# Patient Record
Sex: Female | Born: 1950
Health system: Southern US, Community
[De-identification: ages and names within clinical notes are randomized; demographics above are authoritative.]

## PROBLEM LIST (undated history)

## (undated) DIAGNOSIS — R519 Headache, unspecified: Secondary | ICD-10-CM

## (undated) DIAGNOSIS — C50912 Malignant neoplasm of unspecified site of left female breast: Secondary | ICD-10-CM

## (undated) DIAGNOSIS — Z9889 Other specified postprocedural states: Secondary | ICD-10-CM

## (undated) DIAGNOSIS — R112 Nausea with vomiting, unspecified: Secondary | ICD-10-CM

## (undated) DIAGNOSIS — M1990 Unspecified osteoarthritis, unspecified site: Secondary | ICD-10-CM

## (undated) DIAGNOSIS — R011 Cardiac murmur, unspecified: Secondary | ICD-10-CM

## (undated) DIAGNOSIS — R51 Headache: Secondary | ICD-10-CM

## (undated) DIAGNOSIS — IMO0001 Reserved for inherently not codable concepts without codable children: Secondary | ICD-10-CM

## (undated) DIAGNOSIS — IMO0002 Reserved for concepts with insufficient information to code with codable children: Secondary | ICD-10-CM

## (undated) DIAGNOSIS — C50919 Malignant neoplasm of unspecified site of unspecified female breast: Secondary | ICD-10-CM

## (undated) DIAGNOSIS — C50911 Malignant neoplasm of unspecified site of right female breast: Secondary | ICD-10-CM

## (undated) HISTORY — DX: Reserved for concepts with insufficient information to code with codable children: IMO0002

## (undated) HISTORY — DX: Headache: R51

## (undated) HISTORY — DX: Headache, unspecified: R51.9

## (undated) HISTORY — DX: Cardiac murmur, unspecified: R01.1

## (undated) HISTORY — DX: Reserved for inherently not codable concepts without codable children: IMO0001

## (undated) HISTORY — DX: Unspecified osteoarthritis, unspecified site: M19.90

---

## 1997-07-27 HISTORY — PX: DILATION AND CURETTAGE OF UTERUS: SHX78

## 1999-01-20 ENCOUNTER — Other Ambulatory Visit: Admission: RE | Admit: 1999-01-20 | Discharge: 1999-01-20 | Payer: Self-pay | Admitting: *Deleted

## 2000-10-18 ENCOUNTER — Other Ambulatory Visit: Admission: RE | Admit: 2000-10-18 | Discharge: 2000-10-18 | Payer: Self-pay | Admitting: *Deleted

## 2002-07-27 HISTORY — PX: ABDOMINAL HYSTERECTOMY: SHX81

## 2003-02-06 ENCOUNTER — Other Ambulatory Visit: Admission: RE | Admit: 2003-02-06 | Discharge: 2003-02-06 | Payer: Self-pay | Admitting: *Deleted

## 2003-05-29 ENCOUNTER — Observation Stay (HOSPITAL_COMMUNITY): Admission: RE | Admit: 2003-05-29 | Discharge: 2003-05-30 | Payer: Self-pay | Admitting: *Deleted

## 2003-05-29 ENCOUNTER — Encounter (INDEPENDENT_AMBULATORY_CARE_PROVIDER_SITE_OTHER): Payer: Self-pay

## 2004-05-14 ENCOUNTER — Other Ambulatory Visit: Admission: RE | Admit: 2004-05-14 | Discharge: 2004-05-14 | Payer: Self-pay | Admitting: *Deleted

## 2005-07-06 ENCOUNTER — Other Ambulatory Visit: Admission: RE | Admit: 2005-07-06 | Discharge: 2005-07-06 | Payer: Self-pay | Admitting: *Deleted

## 2005-09-16 ENCOUNTER — Encounter: Admission: RE | Admit: 2005-09-16 | Discharge: 2005-09-16 | Payer: Self-pay | Admitting: Rheumatology

## 2007-09-23 ENCOUNTER — Other Ambulatory Visit: Admission: RE | Admit: 2007-09-23 | Discharge: 2007-09-23 | Payer: Self-pay | Admitting: Family Medicine

## 2008-07-27 HISTORY — PX: BREAST ENHANCEMENT SURGERY: SHX7

## 2008-07-27 HISTORY — PX: BREAST LUMPECTOMY: SHX2

## 2008-10-02 ENCOUNTER — Encounter: Admission: RE | Admit: 2008-10-02 | Discharge: 2008-10-02 | Payer: Self-pay | Admitting: Surgery

## 2008-11-06 ENCOUNTER — Encounter (INDEPENDENT_AMBULATORY_CARE_PROVIDER_SITE_OTHER): Payer: Self-pay | Admitting: Surgery

## 2008-11-06 ENCOUNTER — Ambulatory Visit (HOSPITAL_COMMUNITY): Admission: RE | Admit: 2008-11-06 | Discharge: 2008-11-06 | Payer: Self-pay | Admitting: Surgery

## 2008-11-06 DIAGNOSIS — C50912 Malignant neoplasm of unspecified site of left female breast: Secondary | ICD-10-CM

## 2008-11-06 HISTORY — DX: Malignant neoplasm of unspecified site of left female breast: C50.912

## 2008-11-08 ENCOUNTER — Ambulatory Visit: Payer: Self-pay | Admitting: Oncology

## 2008-11-13 ENCOUNTER — Ambulatory Visit: Admission: RE | Admit: 2008-11-13 | Discharge: 2009-02-06 | Payer: Self-pay | Admitting: Radiation Oncology

## 2008-12-18 ENCOUNTER — Encounter (INDEPENDENT_AMBULATORY_CARE_PROVIDER_SITE_OTHER): Payer: Self-pay | Admitting: Surgery

## 2008-12-18 ENCOUNTER — Ambulatory Visit (HOSPITAL_COMMUNITY): Admission: RE | Admit: 2008-12-18 | Discharge: 2008-12-18 | Payer: Self-pay | Admitting: Surgery

## 2009-01-31 ENCOUNTER — Ambulatory Visit: Payer: Self-pay | Admitting: Oncology

## 2009-02-06 ENCOUNTER — Ambulatory Visit: Admission: RE | Admit: 2009-02-06 | Discharge: 2009-03-26 | Payer: Self-pay | Admitting: Radiation Oncology

## 2009-03-21 ENCOUNTER — Ambulatory Visit: Payer: Self-pay | Admitting: Oncology

## 2009-03-25 LAB — CBC WITH DIFFERENTIAL/PLATELET
BASO%: 0.4 % (ref 0.0–2.0)
Basophils Absolute: 0 10*3/uL (ref 0.0–0.1)
EOS%: 1.4 % (ref 0.0–7.0)
Eosinophils Absolute: 0.1 10*3/uL (ref 0.0–0.5)
HCT: 41 % (ref 34.8–46.6)
HGB: 13.8 g/dL (ref 11.6–15.9)
LYMPH%: 17.1 % (ref 14.0–49.7)
MCH: 28.7 pg (ref 25.1–34.0)
MCHC: 33.8 g/dL (ref 31.5–36.0)
MCV: 85 fL (ref 79.5–101.0)
MONO#: 0.4 10*3/uL (ref 0.1–0.9)
MONO%: 8.4 % (ref 0.0–14.0)
NEUT#: 3.5 10*3/uL (ref 1.5–6.5)
NEUT%: 72.7 % (ref 38.4–76.8)
Platelets: 221 10*3/uL (ref 145–400)
RBC: 4.82 10*6/uL (ref 3.70–5.45)
RDW: 13.9 % (ref 11.2–14.5)
WBC: 4.9 10*3/uL (ref 3.9–10.3)
lymph#: 0.8 10*3/uL — ABNORMAL LOW (ref 0.9–3.3)

## 2009-03-25 LAB — COMPREHENSIVE METABOLIC PANEL
ALT: 14 U/L (ref 0–35)
AST: 15 U/L (ref 0–37)
Albumin: 4.6 g/dL (ref 3.5–5.2)
Alkaline Phosphatase: 90 U/L (ref 39–117)
BUN: 11 mg/dL (ref 6–23)
CO2: 21 mEq/L (ref 19–32)
Calcium: 9.7 mg/dL (ref 8.4–10.5)
Chloride: 103 mEq/L (ref 96–112)
Glucose, Bld: 96 mg/dL (ref 70–99)
Potassium: 4.1 mEq/L (ref 3.5–5.3)
Sodium: 139 mEq/L (ref 135–145)
Total Bilirubin: 0.4 mg/dL (ref 0.3–1.2)
Total Protein: 7.4 g/dL (ref 6.0–8.3)

## 2009-06-24 ENCOUNTER — Ambulatory Visit: Payer: Self-pay | Admitting: Oncology

## 2009-08-26 ENCOUNTER — Other Ambulatory Visit: Admission: RE | Admit: 2009-08-26 | Discharge: 2009-08-26 | Payer: Self-pay | Admitting: Family Medicine

## 2009-08-27 ENCOUNTER — Ambulatory Visit: Payer: Self-pay | Admitting: Obstetrics & Gynecology

## 2009-09-20 ENCOUNTER — Ambulatory Visit: Payer: Self-pay | Admitting: Obstetrics & Gynecology

## 2009-09-20 ENCOUNTER — Ambulatory Visit (HOSPITAL_COMMUNITY): Admission: RE | Admit: 2009-09-20 | Discharge: 2009-09-21 | Payer: Self-pay | Admitting: Obstetrics & Gynecology

## 2009-10-02 ENCOUNTER — Ambulatory Visit: Payer: Self-pay | Admitting: Obstetrics & Gynecology

## 2009-10-22 ENCOUNTER — Ambulatory Visit: Payer: Self-pay | Admitting: Obstetrics & Gynecology

## 2010-10-17 LAB — BASIC METABOLIC PANEL
BUN: 12 mg/dL (ref 6–23)
CO2: 29 mEq/L (ref 19–32)
Calcium: 9.4 mg/dL (ref 8.4–10.5)
Creatinine, Ser: 0.56 mg/dL (ref 0.4–1.2)
Potassium: 4.1 mEq/L (ref 3.5–5.1)

## 2010-10-17 LAB — CBC
Hemoglobin: 10.8 g/dL — ABNORMAL LOW (ref 12.0–15.0)
MCHC: 34.2 g/dL (ref 30.0–36.0)
MCV: 88.4 fL (ref 78.0–100.0)
MCV: 88.5 fL (ref 78.0–100.0)
Platelets: 216 10*3/uL (ref 150–400)
RBC: 3.58 MIL/uL — ABNORMAL LOW (ref 3.87–5.11)
RBC: 4.63 MIL/uL (ref 3.87–5.11)
RDW: 13.4 % (ref 11.5–15.5)
RDW: 13.7 % (ref 11.5–15.5)
WBC: 4.2 10*3/uL (ref 4.0–10.5)
WBC: 8.4 10*3/uL (ref 4.0–10.5)

## 2010-10-22 ENCOUNTER — Other Ambulatory Visit: Payer: Self-pay | Admitting: Obstetrics & Gynecology

## 2010-10-22 ENCOUNTER — Ambulatory Visit: Payer: 59 | Admitting: Obstetrics & Gynecology

## 2010-10-22 DIAGNOSIS — Z1272 Encounter for screening for malignant neoplasm of vagina: Secondary | ICD-10-CM

## 2010-10-22 DIAGNOSIS — Z01419 Encounter for gynecological examination (general) (routine) without abnormal findings: Secondary | ICD-10-CM

## 2010-10-28 NOTE — Assessment & Plan Note (Signed)
NAMECYLEIGH, Natalie Valdez              ACCOUNT NO.:  [id number]  MEDICAL RECORD NO.:  [id number]           PATIENT TYPE:  LOCATION:  [facility] at [place]           FACILITY:  PHYSICIAN:  Allie Bossier, MD             DATE OF BIRTH:  DATE OF SERVICE:  10/22/2010                                 CLINIC NOTE  Ms. Cheeks is a 60 year old married white G4, P4 who is here for annual exam.  Her main complaint today is that of anxiety due to job stress. She would like some medication for this.  However, I am going to have to wait to give her medicine until she can give me her full medicine list. She tells me that recently she was diagnosed with a tremor and was placed on anti-tremor medicine, and she will have to call me and let me know what that medicine is.  She also tells me she is taking a anti- swelling medicine, and she will call me and let me know what that medicine is.  MEDICATIONS:  As above plus Tylenol, fish oil, vitamin C, vitamin D, vitamin E, glucosamine, zinc, multivitamin, Ambien at night, and Vagifem 2 times a week on a p.r.n. basis.  No latex allergies.  No drug allergies.  SOCIAL HISTORY:  She reports social alcohol.  Denies tobacco or illegal drug use.  FAMILY HISTORY:  Positive for breast cancer in her mother who is postmenopausal at the time of diagnosis and uterine cancer in a paternal grandmother.  She denies family history of colon or pancreas cancer.  PAST SURGICAL HISTORY:  Left lumpectomy x2, supracervical hysterectomy and BSO in 2005, C-section, D and C, laparoscopy, reconstructive breast surgery, midurethral sling anterior and posterior repair, and those were all done last year February 2011.  MEDICAL HISTORY:  History of left breast cancer, stage I.  PHYSICAL EXAMINATION:  GENERAL:  Well-nourished, well-hydrated white female. VITAL SIGNS:  Height 5 feet 4-1/2 inches, weight 151 pounds, blood pressure 125/80, pulse 84. HEENT:  Normal. HEART:  Regular rate  and rhythm. LUNGS:  Clear to auscultation bilaterally. BREASTS:  Normal bilaterally. ABDOMEN:  Benign.  No palpable hepatosplenomegaly. EXTERNAL GENITALIA:  Moderate atrophy.  No lesions.  Good support.  Her cervix has first-degree prolapse, but appears normal.  Bimanual exam reveals no pelvic masses and no tenderness.  ASSESSMENT AND PLAN: 1. Annual exam.  I am going to check a Pap smear since she still has a     cervix.  I am recommending self-breast and self-vulvar exams,     counseled for the mammograms and has 20% of breast cancer. 2. Vaginal atrophy.  I am giving her 10 mcg of Vagifem to be used     twice a week or less often as she prefers. 3. Anxiety.  I will have to wait till she calls me and tells me what     other prescription medications she is on.  At that time, I will be     able to prescribe her some medicines.  REVIEW OF SYSTEMS:  She has been married for 14 years.  She denies dyspareunia.  She has maintained her 50-pound weight loss with exercise and  diet changes, and her work is very stressful.     Allie Bossier, MD    MCD/MEDQ  D:  10/22/2010  T:  10/23/2010  Job:  [id number]

## 2010-11-04 LAB — BASIC METABOLIC PANEL
CO2: 27 mEq/L (ref 19–32)
Calcium: 9.4 mg/dL (ref 8.4–10.5)
Potassium: 4.1 mEq/L (ref 3.5–5.1)
Sodium: 141 mEq/L (ref 135–145)

## 2010-11-04 LAB — CBC
HCT: 38.8 % (ref 36.0–46.0)
MCV: 83.9 fL (ref 78.0–100.0)
Platelets: 241 10*3/uL (ref 150–400)
RBC: 4.63 MIL/uL (ref 3.87–5.11)
RDW: 14 % (ref 11.5–15.5)

## 2010-11-05 LAB — CBC
HCT: 40 % (ref 36.0–46.0)
Hemoglobin: 13.7 g/dL (ref 12.0–15.0)
MCHC: 34.2 g/dL (ref 30.0–36.0)
MCV: 84.1 fL (ref 78.0–100.0)
Platelets: 303 10*3/uL (ref 150–400)

## 2010-11-05 LAB — DIFFERENTIAL
Lymphocytes Relative: 30 % (ref 12–46)
Lymphs Abs: 2.1 10*3/uL (ref 0.7–4.0)
Monocytes Absolute: 0.5 10*3/uL (ref 0.1–1.0)
Monocytes Relative: 8 % (ref 3–12)
Neutro Abs: 4.3 10*3/uL (ref 1.7–7.7)

## 2010-11-05 LAB — BASIC METABOLIC PANEL
CO2: 28 mEq/L (ref 19–32)
GFR calc Af Amer: >60 mL/min (ref >60)
GFR calc non Af Amer: >60 mL/min (ref >60)

## 2010-12-09 NOTE — Op Note (Signed)
NAMESUTTON, PLAKE              ACCOUNT NO.:  [id number]   MEDICAL RECORD NO.:  [id number]          PATIENT TYPE:  AMB   LOCATION:  SDS                          FACILITY:  [facility]   PHYSICIAN:  Thomas A. Cornett, M.D.DATE OF BIRTH:  1951-04-06   DATE OF PROCEDURE:  11/06/2008  DATE OF DISCHARGE:  11/06/2008                               OPERATIVE REPORT   PREOPERATIVE DIAGNOSIS:  Left breast cancer.   POSTOPERATIVE DIAGNOSIS:  Left breast cancer.   PROCEDURES:  1. Left breast needle-localized lumpectomy.  2. Injection of methylene blue dye into left breast for sentinel lymph      node mapping.  3. Left axillary lymph node dissection.   SURGEON:  Maisie Fus A. Cornett, MD   ANESTHESIA:  LMA with 0.25% Sensorcaine local.   ESTIMATED BLOOD LOSS:  50 mL.   DRAIN:  19 round Blake drain to left axilla.   SPECIMEN:  1. Left breast needle-localized tissue verified by radiography to be      adequate with an additional anterior margin taken with wire and      clip present.  2. Left axillary contents.   INDICATIONS FOR PROCEDURE:  The patient is a 60 year old female found to  have a left breast cancer.  This was felt to be stage I clinically.  She  elected for breast conserving measures, been presents today for left  breast needle-localized lumpectomy and sentinel lymph node mapping.   DESCRIPTION OF PROCEDURE:  The patient was brought to the operating room  and placed supine.  She underwent preoperative injection of technetium  sulfur colloid in the holding area and left breast wire localization by  the radiologist.  After being brought to the operating room, general  anesthesia was initiated and left breast was prepped with chlorhexidine  and 4 mL of methylene blue dye were injected in the medial aspect at the  nipple skin interface in a subcuticular region.  This was massaged for 5  minutes.  Next, a left breast was then prepped and draped in a sterile  fashion to include the left  axilla.  NeoProbe was then used, but no  radioactive activity could be identified in the left axilla.  There was  activity in the breast.  This point, I made an incision to the left  axilla with the hope of finding the nodes via the blue dye which was  methylene blue.  Incision was made and dissection was carried down into  the left axilla.  I examined the left axillary contents again could find  no blue nodes nor hot nodes.  I went ahead and packed this off because  the left axillary node dissection would have to be done.   I then switched gears and performed the left breast needle-localized  lumpectomy.  The wire entered into the breast medially.  A horizontal  incision was made from the wire towards the nipple.  All tissue around  the wire was excised.  The anterior margin I felt was closed with the  specimen, so I took additional anterior margin with it.  The radiographs  were done by  the radiologist who said the anterior margin was closed.  I  let him know I took additional tissue since the calcifications were  indeed with the wire, but anterior margin was closed.  This additional  tissue was sent separately.  I irrigated out the cavity and placed dry  sponges there.   Next, I turned attention to left axilla again.  I then dissected out the  left axillary contents.  Identified the long thoracic nerve medially and  the thoracodorsal trunk posteriorly.  The axillary vein was identified  as well.  Small vessels entering the axillary contents were taken  between clips and the axillary vein was preserved.  All lymphovascular  tissue between the axillary vein, the thoracodorsal trunk and the long  thoracic nerve was identified and excised.  The structures were easily  visible afterwards.  I saw no injury to the axillary vein upon close  inspection nor any injury to the thoracodorsal trunk or long thoracic  nerves.  The intercostal nerves were sacrifice.  Irrigation was used and  the area  was hemostatic.  Surgicel was placed in the base.  There was  separate stab wound, 19 Blake drain was placed and secured the skin with  3-0 nylon.  A 3-0 Vicryl was then used to close the deep layer of tissue  and 4-0 Monocryl was used to close skin in subcuticular fashion.   A lumpectomy site was inspected and found to be hemostatic.  Sponges  were removed and counted, and found to be correct.  This wound was  closed in layers with the deep layer of 3-0 Vicryl and subsequent 3-0  Monocryl layer.  Dermabond was applied to both incisions.  All final  counts of sponge, needle, and instruments were found to be correct at  this portion of the case.  The patient was awoke, taken to recovery in  satisfactory condition.      Thomas A. Cornett, M.D.  Electronically Signed     TAC/MEDQ  D:  11/06/2008  T:  11/07/2008  Job:  [id number]   cc:   [facility] L. Yolanda Bonine, M.D.

## 2010-12-09 NOTE — Assessment & Plan Note (Signed)
Natalie Valdez, Natalie Valdez              ACCOUNT NO.:  [id number]   MEDICAL RECORD NO.:  [id number]          PATIENT TYPE:  POB   LOCATION:  [facility] [place]         FACILITY:  [facility]   PHYSICIAN:  Allie Bossier, MD        DATE OF BIRTH:  Jul 06, 1951   DATE OF SERVICE:                                  CLINIC NOTE   Natalie Valdez is a 60 year old married white G4, P4 who comes in with a 18-month  history of feeling something in my vagina.  She says that there is  something bulges especially when she has to urinate and decreases in  size when she voids.  She complains of a 34-year history of stress  incontinence.  She states she does not actually have to wear pads, but  she will wet her pants on any occasion when she laughs hard or jumps.  She recently saw her family doctor who thought she had some degree of  prolapse of her cervix.  Please note that she had a supracervical  hysterectomy and a BSO in approximately the year 2005.   PAST SURGICAL HISTORY:  As above plus left lumpectomy x2, a D and C, and  a laparoscopy.   PAST MEDICAL HISTORY:  History of left breast CA, stage I; genuine  stress urinary incontinence for 34 years.   REVIEW OF SYSTEMS:  She had radiation after her lumpectomies in  September 2010.  She has been married for 13 years.  She does have  occasional vaginal dryness and does not have dyspareunia.  She has  intercourse for about 2 times a month.  She does have a decreased libido  which she attributes to her age.  She has had a recent 50-pound weight  loss, this was intentional.  Her Pap smear was done today and her  mammogram was done last September 2010.  She is aware that she needs to  schedule a colonoscopy in the near future.   FAMILY HISTORY:  Positive for breast cancer in her mother who was  diagnosed in a postmenopausal age and uterine cancer in a paternal  grandmother.  She denies family history of colon cancer.   MEDICATIONS:  1. Tylenol as necessary.  2. Fish oil  1000 mg daily.  3. Vitamin C 500 mg daily.  4. Vitamin D 400 units daily.  5. Vitamin E 1000 units daily.  6. Glucosamine.  7. Zinc.  8. Multivitamins daily.  9. She was recently given samples of Lunesta because she recently had      difficulty sleeping.   PHYSICAL EXAMINATION:  GENERAL:  Well nourished, well hydrated very  pleasant white female in no apparent distress.  HEENT:  Normal.  HEART:  Regular rate and rhythm.  LUNGS:  Clear to auscultation bilaterally.  PELVIC:  External genitalia, minimal amount of atrophy.  She has a third-  degree cystocele and second-degree prolapse of her cervix.  There is no  rectocele.  There is no adnexal masses on exam.   ASSESSMENT AND PLAN:  Symptomatic cystocele and genuine stress urinary  incontinence.  I will schedule an anterior colporrhaphy and a  pubovaginal sling [employer]).  It would be an  outpatient  procedure and I have told her that she will go home with catheter, have  a voiding trial couple days after surgery.  I told her the risks  associated with surgery including, but not limited to infection,  bleeding, damage to bowel, bladder or ureters.      Allie Bossier, MD     MCD/MEDQ  D:  08/27/2009  T:  08/28/2009  Job:  [id number]

## 2010-12-09 NOTE — Op Note (Signed)
Natalie Valdez, Natalie Valdez              ACCOUNT NO.:  [id number]   MEDICAL RECORD NO.:  [id number]          PATIENT TYPE:  AMB   LOCATION:  SDS                          FACILITY:  [facility]   PHYSICIAN:  Thomas A. Cornett, M.D.DATE OF BIRTH:  July 16, 1951   DATE OF PROCEDURE:  12/18/2008  DATE OF DISCHARGE:  12/18/2008                               OPERATIVE REPORT   PREOPERATIVE DIAGNOSIS:  Left breast ductal carcinoma in situ, high-  grade with positive margins after lumpectomy.   POSTOPERATIVE DIAGNOSIS:  Left breast ductal carcinoma in situ, high-  grade with positive margins after lumpectomy.   PROCEDURE:  Re-excision, left breast lumpectomy.   SURGEON:  Maisie Fus A. Cornett, MD   ANESTHESIA:  LMA with 0.25% Sensorcaine local.   ESTIMATED BLOOD LOSS:  10 mL.   SPECIMEN:  Left breast lumpectomy cavity, oriented and sent to pathology  for evaluation.   INDICATIONS FOR PROCEDURE:  The patient is a 60 year old female who has  high-grade left breast DCIS after lumpectomy.  She requires re-excision  for breast conserving means and presents today for that.   DESCRIPTION OF PROCEDURE:  The patient was brought to the operating room  and placed supine.  After induction of general anesthesia, left breast  was then prepped and draped in sterile fashion.  The medial breast  incision was reopened and the cavity was identified.  The entire cavity  was reexcised.  This was oriented and sent to pathology.  Irrigation was  used and suctioned out.  I then mobilized some breast tissue to help  close the defect from superior and inferior portions of the breast.  However, I then closed with a 3-0 Vicryl.  I then approximated the  subcutaneous tissues with 3-0 Vicryl and subsequent 4-0 Monocryl was  used to close the skin in subcuticular fashion.  Dermabond was applied.  All final counts of sponge, needle, and instrument were found to be  correct at this portion of the case.  The patient was awoke and  taken to  recovery in satisfactory condition.      Thomas A. Cornett, M.D.  Electronically Signed     TAC/MEDQ  D:  12/18/2008  T:  12/19/2008  Job:  [id number]   cc:   Maryln Gottron, M.D.  Jethro Bolus, MD

## 2010-12-09 NOTE — Assessment & Plan Note (Signed)
[name], Natalie Valdez              ACCOUNT NO.:  [id number]   MEDICAL RECORD NO.:  [id number]          PATIENT TYPE:  POB   LOCATION:  [facility] [place]         FACILITY:  [facility]   PHYSICIAN:  Allie Bossier, MD        DATE OF BIRTH:  May 07, 1951   DATE OF SERVICE:  10/22/2009                                  CLINIC NOTE   [name] is a 60 year old married white lady who is now postop x4-1/2  week after a anterior repair, perineoplasty, midurethral sling [facility]  [employer]), cystoscopy, and breast bilateral mastopexy with the general  surgeon.  Postoperatively, she is doing well.  She has no difficulties  voiding and also she denies stress incontinence.  She has not had  intercourse yet.  She has no postop problems.   On exam, she does have significant atrophy of her vagina, but her  incisions are well-healed.  Her cosmetic effect is pleasing and her  bimanual exam is normal.  I have recommended that she use Vagifem 10 mcg  twice a week in her vagina and that she wait at least a week before  having intercourse again.  I have told her that at this level (10 mcg  twice a week they will not be significant systemic absorption and should  not affect her history of breast cancer.)  She understands, wishes to  try, written a prescription.  She will come back in a year or sooner as  necessary.      Allie Bossier, MD     MCD/MEDQ  D:  10/22/2009  T:  10/23/2009  Job:  [id number]

## 2010-12-12 NOTE — Op Note (Signed)
Natalie Valdez, Natalie Valdez                   ACCOUNT NO.:  [id number]   MEDICAL RECORD NO.:  [id number]                   PATIENT TYPE:  OBV   LOCATION:  0470                                 FACILITY:  [facility]   PHYSICIAN:  Almedia Balls. Fore, M.D.                DATE OF BIRTH:  1950/11/05   DATE OF PROCEDURE:  05/29/2003  DATE OF DISCHARGE:                                 OPERATIVE REPORT   PREOPERATIVE DIAGNOSES:  1. Pelvic pain.  2. Persisting adnexal cyst.  3. Abnormal uterine bleeding.   POSTOPERATIVE DIAGNOSES:  1. Pelvic pain.  2. Persisting adnexal cyst.  3. Abnormal uterine bleeding.  4. Extensive pelvic adhesions with ureteral adhesions.   OPERATION:  1. Abdominal supracervical hypertension, bilateral salpingo-oophorectomy.  2. Right ureterolysis.   ANESTHESIA:  General orotracheal.   OPERATOR:  Almedia Balls. Randell Patient, M.D.   FIRST ASSISTANT:  Leona Singleton, M.D.   INDICATIONS:  The patient is a 60 year old with the above-noted preoperative  diagnoses, who was counseled as to the need for surgery to treat these  problems.  She was fully counseled as to the nature of the procedure and the  risks involved, to include risks of anesthesia, injury to bowel, bladder,  blood vessels, ureters, postoperative hemorrhage, infection, recuperation,  and possible use of hormone replacement should her ovaries be removed.  She  fully understands all these considerations and wishes to proceed on May 29, 2003.   OPERATIVE FINDINGS:  On entry into the abdomen, exploration of the upper  abdominal viscera revealed the liver, lower liver edge, area of the  gallbladder, spleen, kidneys, periaortic areas, and appendix to be normal to  visualization and/or palpation.  In the pelvis, the uterus was enlarged to  approximately 8-10 weeks' gestational size and quite soft.  The left ovary  was involved with adhesions to the descending rectosigmoid.  There was what  appeared to be a simple cyst  on the right ovary as well.  In the right  adnexal area the entire adnexa was quite adherent to the posterolateral  peritoneal surface with some retraction of the peritoneum and alteration in  the course of the right ureter.  The right tube was involved with extensive  hydrosalpinx formation.  The right ovary was quite adherent to the  posterolateral peritoneum and lateral posterior uterine surfaces.   DESCRIPTION OF PROCEDURE:  With the patient under general anesthesia,  prepared and draped in the usual sterile fashion, with a Foley catheter in  the bladder, a lower abdominal transverse incision was made after excising a  previous surgical incision.  The incision was carried into the peritoneal  cavity without difficulty.  A self-retaining retractor was placed after  lysis of an anterior omental adhesion to the peritoneum.  Bowel was packed  off as well.  Kelly clamps were used to clamp the uterine-ovarian  anastomoses, tubes, and round ligaments bilaterally for traction and  hemostasis.  The  left round ligament was transected using Bovie  electrocoagulation with development of a bladder flap anteriorly and entry  into the retroperitoneal space.  Adhesions were lysed in the area of the  left adnexa to the descending rectosigmoid.  The infundibulopelvic ligament  on the left was identified, clamped, cut, and doubly ligated with 1 chromic  catgut.  The left tube and ovary were then freed of their adhesions and  peritoneal attachments using Bovie electrocoagulation.  Heaney clamp was  placed on the uterine vessels on the left; these structures were then cut  and suture ligated with 1 chromic catgut.  A straight Heaney clamp was  placed on the cardinal ligament on the left, which was then cut and suture  ligated with 1 chromic catgut.  The right round ligament was likewise  transected using Bovie electrocoagulation with development of the bladder  flap anteriorly.  Great care was then taken  to lyse the adhesions involving  the adnexa on the right.  The adhesions to the ureter were lysed using sharp  dissection with great care taken to avoid injury to the ureter or vessels in  this area.  This effectively allowed the ureter to be replaced in its  original course.  The right adnexal structures were then able to be elevated  out of the posterior cul-de-sac and posterolateral peritoneal area.  The  right infundibulopelvic ligament was then identified well above the course  of the ureter, clamped, cut, and doubly ligated with 1 chromic catgut.  Further dissection was necessary to free the adnexal structures on the  right, again with great care to avoid injury to the ureter.  The right  uterine vessels were then skeletonized, clamped, cut, and suture ligated  with 1 chromic catgut.  The right cardinal ligament was likewise clamped  using a Heaney clamp, cut, and suture ligated with 1 chromic catgut.  The  bladder flap had been advanced well down on the cervix, and it was possible  to amputate the uterine fundus from the cervix using Bovie  electrocoagulation.  The endocervix was cored out a reverse cone method.  The exocervix was likewise treated using Bovie electrocoagulation to prevent  hemorrhage and to destroy endocervical glands to prevent extensive  leukorrhea.  The cervical stump was reapproximated and rendered hemostatic  with interrupted figure-of-eight sutures of 1 chromic catgut.  The area was  lavaged with copious amounts of lactated Ringer's solution and after noting  that hemostasis was maintained throughout the surgical area and that sponge  and instrument counts were correct, the peritoneum was closed with a  continuous suture of 0 Vicryl.  The fascia was closed with two sutures of 0  Vicryl which were brought from the lateral aspects of the incision and tied  separately in the midline.  Subcutaneous fat was reapproximated with interrupted horizontal mattress sutures  of 0 Vicryl.  Skin was closed with a  subcuticular suture of 3-0 plain catgut.  Estimated blood loss 250 mL.  The  patient was taken to the recovery room in good condition with clear urine in  the Foley catheter tubing.  She will be placed on 23-hour observation  following surgery.                                               Almedia Balls. Randell Patient, M.D.    SRF/MEDQ  D:  05/29/2003  T:  05/30/2003  Job:  [id number]   cc:   Leona Singleton, M.D.  [address]  [place]  [place] [address]  Fax: [phone]

## 2010-12-12 NOTE — H&P (Signed)
NAMEMIRINDA, MONTE                   ACCOUNT NO.:  [id number]   MEDICAL RECORD NO.:  [id number]                   PATIENT TYPE:  AMB   LOCATION:  DAY                                  FACILITY:  [facility]   PHYSICIAN:  Almedia Balls. Fore, M.D.                DATE OF BIRTH:  1950-10-11   DATE OF ADMISSION:  05/29/2003  DATE OF DISCHARGE:                                HISTORY & PHYSICAL   CHIEF COMPLAINT:  Pain, abnormal bleeding, adnexal cysts and adhesions.   HISTORY OF PRESENT ILLNESS:  The patient is a 60 year old whose last  menstrual period was March 09, 2003.  Because of abnormal bleeding and pain  she underwent a laparoscopy on April 27, 2003 at which time she was found  to have adnexal cysts which were quite diffuse particularly on the right  side with some adhesions involving the uterus and tubes and ovaries  throughout the pelvis.  The uterus was enlarged as well with the suggestion  of endometriosis.  A D&C was performed at that time as well which showed  disordered endometrium with foci of simple hyperplasia without atypia.  She  is admitted at this time for TAH/BSO.  She has been fully counseled as to  the nature of the procedure and the risks involved to include the risks of  anesthesia, injury to bowel, bladder, blood vessels, ureters, postoperative  hemorrhage, infection, recuperation and menopausal symptoms following  surgery.  Because of previous deep venous phlebitis, hormone therapy was  contraindicated for this patient.   PAST MEDICAL HISTORY:  1. Cesarean section in the past.  2. Laparoscopy and D&C as noted above.   ALLERGIES:  She is allergic to no medications.   MEDICATIONS:  1. Simple analgesics recently.   FAMILY HISTORY:  Mother with cancer of unknown type.  Grandmother with  carcinoma of the ovary.   REVIEW OF SYMPTOMS:  HEENT:  She wears glasses.  CARDIORESPIRATORY:  Negative.  GASTROINTESTINAL:  Negative.  GENITOURINARY:  As noted above  plus  some urinary frequency with occasional urge and stress incontinence, but not  severe.  NEUROMUSCULAR:  Negative.   PHYSICAL EXAMINATION:  VITAL SIGNS:  Height 5'5 1/2, weight 181 pounds,  blood pressure 120/70, pulse 80, respirations 18.  GENERAL:  Well developed white female in no acute distress.  HEENT:  Within normal limits.  The neck is supple without masses, adenopathy  or bruit.  HEART:  Regular rate and rhythm with a grade 2 murmur at the left sternal  border which had been noted by previous M.D. and checked out without a  definite diagnosis; felt to be an innocent murmur.  BREAST EXAM:  Sitting and lying are without mass.  Axilla is negative.  ABDOMEN:  Soft with no palpable mass and non-tender.  PELVIC EXAM:  External genitalia, Bartholin's, urethral and Skein's glands  are within normal limits.  Vagina is clean. The cervix is slightly inflamed.  The uterus  is mid posterior and approximately 10 to [redacted] weeks gestational  size.  Adnexa are without palpable masses, but tender bilaterally.  Rectovaginal exam is confirmatory.  EXTREMITIES:  Within normal limits.  CENTRAL NERVOUS SYSTEM:  Grossly intact.  SKIN:  Without suspicious lesions.   IMPRESSION:  1. Abnormal bleeding.  2. Uterine enlargement.  3. Pelvic pain.  4. Persisting adnexal cysts.   DISPOSITION:  As noted above.  Of note is that a Pap smear in July of 2004  was within normal limits.                                               Almedia Balls. Randell Patient, M.D.    SRF/MEDQ  D:  05/23/2003  T:  05/23/2003  Job:  [id number]

## 2010-12-12 NOTE — Discharge Summary (Signed)
   Natalie Valdez, Natalie Valdez                   ACCOUNT NO.:  [id number]   MEDICAL RECORD NO.:  [id number]                   PATIENT TYPE:  OBV   LOCATION:  0470                                 FACILITY:  [facility]   PHYSICIAN:  Almedia Balls. Fore, M.D.                DATE OF BIRTH:  08/04/1950   DATE OF ADMISSION:  05/29/2003  DATE OF DISCHARGE:  05/30/2003                                 DISCHARGE SUMMARY   HISTORY:  The patient is a 60 year old with pelvic pain, right adnexal  cystic mass, pelvic adhesions for hysterectomy, possible bilateral salpingo-  oophorectomy on May 29, 2003.  The remainder of her history and physical  are as previously dictated.   LABORATORY DATA:  Included a preoperative hemoglobin of 12.5.   Electrocardiogram was indicative of sinus bradycardia but otherwise normal.   HOSPITAL COURSE:  The patient was taken to the operating room, on May 29, 2003, at which time an abdominal supracervical hysterectomy, bilateral  salpingo-oophorectomy, ureteral lysis were performed.  The patient did well  postoperatively.  Diet and ambulation were progressed over the evening of  May 29, 2003 and early morning May 30, 2003.  On the morning of  May 30, 2003, she was afebrile and experiencing no problems, except for  pain which was controlled by oral analgesics.  It was felt that she could be  discharged at this time.   FINAL DIAGNOSES:  1. Pelvic pain.  2. Cystic right adnexal mass probable hydrosalpinx.  3. Extensive pelvic adhesions with ureteral adhesions.   OPERATION:  Abdominal supracervical hysterectomy and bilateral salpingo-  oophorectomy, ureteral lysis.   Pathology report unavailable at the time of dictation.   DISPOSITION:  Discharged home, to return to the office in two weeks for  followup.  She was instructed to gradually progress her activities over  several weeks at home and to limit lifting and driving for two weeks.  She  was fully  ambulatory, on a regular diet, and in good condition at the time  of discharge.   She was given prescriptions for:  1. Mepergan fortis #30 to be taken one q.4-6h. p.r.n. pain.  2. Doxycycline 100 mg #12, to be taken one b.i.d.                                               Almedia Balls. Randell Patient, M.D.    SRF/MEDQ  D:  05/30/2003  T:  05/30/2003  Job:  [id number]

## 2010-12-18 ENCOUNTER — Encounter (INDEPENDENT_AMBULATORY_CARE_PROVIDER_SITE_OTHER): Payer: Self-pay | Admitting: Surgery

## 2011-03-17 ENCOUNTER — Encounter (INDEPENDENT_AMBULATORY_CARE_PROVIDER_SITE_OTHER): Payer: Self-pay | Admitting: Surgery

## 2011-04-17 ENCOUNTER — Encounter (INDEPENDENT_AMBULATORY_CARE_PROVIDER_SITE_OTHER): Payer: Self-pay | Admitting: Surgery

## 2011-04-17 ENCOUNTER — Ambulatory Visit (INDEPENDENT_AMBULATORY_CARE_PROVIDER_SITE_OTHER): Payer: 59 | Admitting: Surgery

## 2011-04-17 VITALS — BP 118/76 | HR 62 | Temp 98.0°F | Resp 16 | Ht 65.0 in | Wt 160.4 lb

## 2011-04-17 DIAGNOSIS — Z853 Personal history of malignant neoplasm of breast: Secondary | ICD-10-CM

## 2011-04-17 NOTE — Patient Instructions (Signed)
Follow up in 1 year.

## 2011-04-17 NOTE — Progress Notes (Signed)
Subjective:     Patient ID: Natalie Valdez, female   DOB: 1950-12-30, 60 y.o.   MRN: [id number]  HPIThe patient returns today for a 6 month followup for history of left breast DCIS treated by lumpectomy and lymph node dissection secondary to failure to identify sentinel node in 2010. She choose not to take tamoxifen. She is doing well. She reports a spider bite to her left hand a number of weeks ago. She has a large lymph node above her left elbow. Denies any fever or chills. There is no redness.  Review of Systems  Constitutional: Negative.   HENT: Negative.   Respiratory: Negative.   Cardiovascular: Negative.   Hematological: Positive for adenopathy.  Psychiatric/Behavioral: Negative.        Objective:   Physical Exam  Constitutional: She appears well-developed and well-nourished.  HENT:  Head: Normocephalic and atraumatic.  Nose: Nose normal.  Eyes: EOM are normal. Pupils are equal, round, and reactive to light.  Neck: Normal range of motion. Neck supple.  Pulmonary/Chest: Effort normal and breath sounds normal.       Both breasts examined. Both breasts showed scar from previous breast reduction surgery. Left breast lumpectomy site is without mass. Left axilla scar well healed. Right breast is without mass lesion. Right axilla normal.  Musculoskeletal:       A large lymph node just above the left elbow noted. It measures 2 cm. Left hand shows small bite mark over palm. No streaking noted. No registered. Minimal left arm lymphedema.  Skin: Skin is warm and dry.       Assessment:     History of left breast DCIS status post lumpectomy and axillary lymph node dissection due to failed localization of sentinel node April 2010. Mammogram from February 2012 reviewed within normal limits.    Plan:     Return to clinic one year. Continue breast self-examinations. Continue yearly mammography.

## 2011-06-22 ENCOUNTER — Other Ambulatory Visit: Payer: Self-pay | Admitting: *Deleted

## 2011-06-22 NOTE — Telephone Encounter (Signed)
Emailed Dr Marice Potter for authorization as I do not see Celexa listed on the med list.

## 2011-10-28 ENCOUNTER — Encounter: Payer: Self-pay | Admitting: Obstetrics & Gynecology

## 2011-10-28 ENCOUNTER — Ambulatory Visit (INDEPENDENT_AMBULATORY_CARE_PROVIDER_SITE_OTHER): Payer: 59 | Admitting: Obstetrics & Gynecology

## 2011-10-28 VITALS — BP 115/77 | HR 86 | Temp 98.5°F | Resp 16 | Ht 65.0 in | Wt 165.0 lb

## 2011-10-28 DIAGNOSIS — Z01419 Encounter for gynecological examination (general) (routine) without abnormal findings: Secondary | ICD-10-CM

## 2011-10-28 DIAGNOSIS — Z0000 Encounter for general adult medical examination without abnormal findings: Secondary | ICD-10-CM

## 2011-10-28 DIAGNOSIS — Z124 Encounter for screening for malignant neoplasm of cervix: Secondary | ICD-10-CM

## 2011-10-28 NOTE — Progress Notes (Signed)
Subjective:    Natalie Valdez is a 61 y.o. female who presents for an annual exam. The patient has no complaints today. The patient is sexually active. GYN screening history: last pap: was normal. The patient wears seatbelts: yes. The patient participates in regular exercise: no. Has the patient ever been transfused or tattooed?: yes. (tattoo) The patient reports that there is not domestic violence in her life.   Menstrual History: OB History    Grav Para Term Preterm Abortions TAB SAB Ect Mult Living                  Menarche age: 21 No LMP recorded. Patient has had a hysterectomy.    The following portions of the patient's history were reviewed and updated as appropriate: allergies, current medications, past family history, past medical history, past social history, past surgical history and problem list.  Review of Systems A comprehensive review of systems was negative. 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, She had her mammogram this week   Objective:    BP 115/77  Pulse 86  Temp(Src) 98.5 F (36.9 C) (Oral)  Resp 16  Ht 5\' 5"  (1.651 m)  Wt 165 lb (74.844 kg)  BMI 27.46 kg/m2  General Appearance:    Alert, cooperative, no distress, appears stated age  Head:    Normocephalic, without obvious abnormality, atraumatic  Eyes:    PERRL, conjunctiva/corneas clear, EOM's intact, fundi    benign, both eyes  Ears:    Normal TM's and external ear canals, both ears  Nose:   Nares normal, septum midline, mucosa normal, no drainage    or sinus tenderness  Throat:   Lips, mucosa, and tongue normal; teeth and gums normal  Neck:   Supple, symmetrical, trachea midline, no adenopathy;    thyroid:  no enlargement/tenderness/nodules; no carotid   bruit or JVD  Back:     Symmetric, no curvature, ROM normal, no CVA tenderness  Lungs:     Clear to auscultation bilaterally, respirations unlabored  Chest Wall:    No tenderness or deformity   Heart:    Regular rate and  rhythm, S1 and S2 normal, no murmur, rub   or gallop  Breast Exam:    No tenderness, masses, or nipple abnormality  Abdomen:     Soft, non-tender, bowel sounds active all four quadrants,    no masses, no organomegaly  Genitalia:    Normal female without lesion, discharge or tenderness, minimal atrophy, cervix normal, bimanual with no masses or tenderness     Extremities:   Extremities normal, atraumatic, no cyanosis or edema  Pulses:   2+ and symmetric all extremities  Skin:   Skin color, texture, turgor normal, no rashes or lesions  Lymph nodes:   Cervical, supraclavicular, and axillary nodes normal  Neurologic:   CNII-XII intact, normal strength, sensation and reflexes    throughout  .    Assessment:    Healthy female exam.    Plan:     Pap smear.

## 2011-11-05 ENCOUNTER — Encounter (INDEPENDENT_AMBULATORY_CARE_PROVIDER_SITE_OTHER): Payer: Self-pay

## 2012-04-22 ENCOUNTER — Encounter (INDEPENDENT_AMBULATORY_CARE_PROVIDER_SITE_OTHER): Payer: Self-pay | Admitting: Surgery

## 2012-04-22 ENCOUNTER — Ambulatory Visit (INDEPENDENT_AMBULATORY_CARE_PROVIDER_SITE_OTHER): Payer: 59 | Admitting: Surgery

## 2012-04-22 VITALS — BP 114/72 | HR 74 | Temp 98.1°F | Resp 14 | Ht 64.0 in | Wt 174.0 lb

## 2012-04-22 DIAGNOSIS — Z853 Personal history of malignant neoplasm of breast: Secondary | ICD-10-CM

## 2012-04-22 NOTE — Progress Notes (Signed)
Subjective:     Patient ID: Natalie Valdez, female   DOB: [birth year], 61 y.o.   MRN: [id number]  HPIThe patient returns today for a 12 month followup for history of left breast DCIS treated by lumpectomy and lymph node dissection secondary to failure to identify sentinel node in 2010. She choose not to take tamoxifen. She is doing well. She and her husband adopted a new baby.  Review of Systems  Constitutional: Negative.   HENT: Negative.   Respiratory: Negative.   Cardiovascular: Negative.   Hematological: Positive for adenopathy.  Psychiatric/Behavioral: Negative.        Objective:   Physical Exam  Constitutional: She appears well-developed and well-nourished.  HENT:  Head: Normocephalic and atraumatic.  Nose: Nose normal.  Eyes: EOM are normal. Pupils are equal, round, and reactive to light.  Neck: Normal range of motion. Neck supple.  Pulmonary/Chest: Effort normal and breath sounds normal.       Both breasts examined. Both breasts showed scar from previous breast reduction surgery. Left breast lumpectomy site is without mass. Left axilla scar well healed. Right breast is without mass lesion. Right axilla normal.  Musculoskeletal:   no edema  Skin: Skin is warm and dry.       Assessment:     History of left breast DCIS status post lumpectomy and axillary lymph node dissection due to failed localization of sentinel node April 2010. Mammogram from February 2013 reviewed within normal limits.    Plan:     Return to clinic one year. Continue breast self-examinations. Continue yearly mammography.

## 2012-04-22 NOTE — Patient Instructions (Signed)
Return 1 year. 

## 2012-07-05 ENCOUNTER — Other Ambulatory Visit: Payer: Self-pay | Admitting: *Deleted

## 2012-07-05 DIAGNOSIS — F329 Major depressive disorder, single episode, unspecified: Secondary | ICD-10-CM

## 2012-07-05 MED ORDER — CITALOPRAM HYDROBROMIDE 10 MG PO TABS: 10.0000 mg | ORAL_TABLET | Freq: Every day | ORAL | Status: DC

## 2012-07-05 NOTE — Telephone Encounter (Signed)
RF authorization for Celexa sent to [facility] on [address] per Dr Marice Potter.

## 2012-07-21 ENCOUNTER — Other Ambulatory Visit: Payer: Self-pay | Admitting: Obstetrics & Gynecology

## 2012-12-27 ENCOUNTER — Other Ambulatory Visit: Payer: Self-pay | Admitting: *Deleted

## 2012-12-27 DIAGNOSIS — N951 Menopausal and female climacteric states: Secondary | ICD-10-CM

## 2012-12-27 MED ORDER — CITALOPRAM HYDROBROMIDE 10 MG PO TABS: ORAL_TABLET | ORAL | Status: DC

## 2012-12-27 NOTE — Telephone Encounter (Signed)
RF authorization for Celexa 10 mg to [facility] pharmacy

## 2013-01-02 ENCOUNTER — Other Ambulatory Visit (HOSPITAL_COMMUNITY): Payer: Self-pay | Admitting: Family Medicine

## 2013-01-02 ENCOUNTER — Ambulatory Visit (HOSPITAL_COMMUNITY)
Admission: RE | Admit: 2013-01-02 | Discharge: 2013-01-02 | Disposition: A | Discharge status: Home / Self Care | Payer: 59 | Source: Ambulatory Visit | Attending: Family Medicine | Admitting: Family Medicine

## 2013-01-02 DIAGNOSIS — M7989 Other specified soft tissue disorders: Secondary | ICD-10-CM

## 2013-01-02 DIAGNOSIS — M25561 Pain in right knee: Secondary | ICD-10-CM

## 2013-01-02 DIAGNOSIS — M79609 Pain in unspecified limb: Secondary | ICD-10-CM | POA: Insufficient documentation

## 2013-01-02 NOTE — Progress Notes (Signed)
VASCULAR LAB PRELIMINARY  PRELIMINARY  PRELIMINARY  PRELIMINARY  Right lower extremity venous Doppler completed.    Preliminary report:  There is no DVT or SVT noted in the right lower extremity.  Ford Peddie, RVT 01/02/2013, 3:42 PM

## 2013-03-29 ENCOUNTER — Ambulatory Visit (INDEPENDENT_AMBULATORY_CARE_PROVIDER_SITE_OTHER): Payer: 59 | Admitting: Surgery

## 2013-03-31 ENCOUNTER — Encounter (INDEPENDENT_AMBULATORY_CARE_PROVIDER_SITE_OTHER): Payer: Self-pay | Admitting: Surgery

## 2013-03-31 ENCOUNTER — Ambulatory Visit (INDEPENDENT_AMBULATORY_CARE_PROVIDER_SITE_OTHER): Payer: 59 | Admitting: Surgery

## 2013-03-31 VITALS — BP 122/84 | HR 72 | Temp 99.1°F | Resp 14 | Ht 64.5 in | Wt 187.2 lb

## 2013-03-31 DIAGNOSIS — Z853 Personal history of malignant neoplasm of breast: Secondary | ICD-10-CM

## 2013-03-31 NOTE — Progress Notes (Signed)
Subjective:     Patient ID: Natalie Valdez, female   DOB: [birth year], 62 y.o.   MRN: [id number]  HPIThe patient returns today for a 12 month followup for history of left breast DCIS treated by lumpectomy and lymph node dissection secondary to failure to identify sentinel node in 2010. She choose not to take tamoxifen. She is doing well. She and her husband adopted a new baby. No complaints  Review of Systems  Constitutional: Negative.   HENT: Negative.   Respiratory: Negative.   Cardiovascular: Negative.   Hematological: Positive for adenopathy.  Psychiatric/Behavioral: Negative.        Objective:   Physical Exam  Constitutional: She appears well-developed and well-nourished.  HENT:  Head: Normocephalic and atraumatic.  Nose: Nose normal.  Eyes: EOM are normal. Pupils are equal, round, and reactive to light.  Neck: Normal range of motion. Neck supple.  Pulmonary/Chest: Effort normal and breath sounds normal.       Both breasts examined. Both breasts showed scar from previous breast reduction surgery. Left breast lumpectomy site is without mass. Left axilla scar well healed. Right breast is without mass lesion. Right axilla normal.  Musculoskeletal:   no edema  Skin: Skin is warm and dry.       Assessment:     History of left breast DCIS status post lumpectomy and axillary lymph node dissection due to failed localization of sentinel node April 2010. Mammogram from February 2014 reviewed within normal limits.    Plan:     n further follow up needed.. Continue breast self-examinations. Continue yearly mammography.

## 2013-03-31 NOTE — Patient Instructions (Addendum)
You have finished follow up of for breast cancer follow up as needed.  Yearly mammogram

## 2013-04-25 ENCOUNTER — Encounter (INDEPENDENT_AMBULATORY_CARE_PROVIDER_SITE_OTHER): Payer: Self-pay

## 2013-05-09 ENCOUNTER — Ambulatory Visit (INDEPENDENT_AMBULATORY_CARE_PROVIDER_SITE_OTHER): Payer: 59 | Admitting: *Deleted

## 2013-05-09 DIAGNOSIS — Z23 Encounter for immunization: Secondary | ICD-10-CM

## 2013-07-03 ENCOUNTER — Other Ambulatory Visit: Payer: Self-pay | Admitting: *Deleted

## 2013-07-03 DIAGNOSIS — N951 Menopausal and female climacteric states: Secondary | ICD-10-CM

## 2013-07-03 MED ORDER — CITALOPRAM HYDROBROMIDE 10 MG PO TABS: ORAL_TABLET | ORAL | Status: DC

## 2013-12-29 ENCOUNTER — Other Ambulatory Visit: Payer: Self-pay | Admitting: Obstetrics & Gynecology

## 2014-01-01 ENCOUNTER — Telehealth: Payer: Self-pay | Admitting: *Deleted

## 2014-01-01 NOTE — Telephone Encounter (Signed)
RF request from [facility] for Celexa/  Last appt here was 4/13.  Request denied pt needs appt.

## 2014-07-09 ENCOUNTER — Other Ambulatory Visit: Payer: Self-pay | Admitting: Family Medicine

## 2014-07-09 ENCOUNTER — Other Ambulatory Visit (HOSPITAL_COMMUNITY)
Admission: RE | Admit: 2014-07-09 | Discharge: 2014-07-09 | Disposition: A | Discharge status: Home / Self Care | Payer: 59 | Source: Ambulatory Visit | Attending: Family Medicine | Admitting: Family Medicine

## 2014-07-09 DIAGNOSIS — Z124 Encounter for screening for malignant neoplasm of cervix: Secondary | ICD-10-CM | POA: Insufficient documentation

## 2014-07-11 LAB — CYTOLOGY - PAP

## 2014-10-18 ENCOUNTER — Other Ambulatory Visit: Payer: Self-pay | Admitting: Radiology

## 2014-10-18 DIAGNOSIS — C50911 Malignant neoplasm of unspecified site of right female breast: Secondary | ICD-10-CM

## 2014-10-18 HISTORY — DX: Malignant neoplasm of unspecified site of right female breast: C50.911

## 2014-10-19 ENCOUNTER — Other Ambulatory Visit: Payer: Self-pay | Admitting: Radiology

## 2014-10-19 DIAGNOSIS — C50911 Malignant neoplasm of unspecified site of right female breast: Secondary | ICD-10-CM

## 2014-10-23 ENCOUNTER — Ambulatory Visit
Admission: RE | Admit: 2014-10-23 | Discharge: 2014-10-23 | Disposition: A | Discharge status: Home / Self Care | Payer: 59 | Source: Ambulatory Visit | Attending: Radiology | Admitting: Radiology

## 2014-10-23 DIAGNOSIS — C50911 Malignant neoplasm of unspecified site of right female breast: Secondary | ICD-10-CM

## 2014-10-23 MED ORDER — GADOBENATE DIMEGLUMINE 529 MG/ML IV SOLN
17.0000 mL | Freq: Once | INTRAVENOUS | Status: AC | PRN
  Administered 2014-10-23: 17 mL via INTRAVENOUS

## 2014-10-31 ENCOUNTER — Telehealth: Payer: Self-pay | Admitting: *Deleted

## 2014-10-31 NOTE — Telephone Encounter (Signed)
Received request from [facility] to schedule pt.  Called and left a message for the pt to return my call so I can schedule her for med onc and genetics.

## 2014-11-01 ENCOUNTER — Telehealth: Payer: Self-pay | Admitting: *Deleted

## 2014-11-01 NOTE — Telephone Encounter (Signed)
Pt returned my call and I confirmed 11/07/14 appt w/ her.  She was a previous pt of Dr. Lamonte Sakai.  Copied records from [facility].  She also stated that she already had genetic testing done.  [facility]ed [facility] to request for them to see if results are in storage since they are not in [facility] nor [facility].  Placed a note for pt to be given an intake form at time of check in.  Placed a copy of the records in Dr. Geralyn Flash box and took one to HIM to scan.

## 2014-11-02 ENCOUNTER — Ambulatory Visit: Payer: Self-pay | Admitting: Surgery

## 2014-11-02 DIAGNOSIS — C50911 Malignant neoplasm of unspecified site of right female breast: Secondary | ICD-10-CM

## 2014-11-02 NOTE — H&P (Signed)
Natalie Valdez 11/02/2014 9:31 AM Location: [facility] Patient #: [id number] DOB: 1950-11-12 Married / Language: Cleophus Valdez / Race: White Female History of Present Illness [name] A. [name] MD; 11/02/2014 10:59 AM) Patient words: breast cancer right  Pt presents at the request of Natalie Valdez for right breast cancer. Pt has history of left breast cancer 2010 stage 1 treated with breast conservation and ALND due to no SLN identified. This was followed by radiation but she refused tamoxifen. Pt follow and released in 2014. She has had breast reduction since then. Pt notice small lump lateral right breast. Manmmogram and U/S showed i.6 cm mass core bx shown to be IDC/DCIS. She presents for this today. Pt has other wse been doing well without complaint.       CLINICAL DATA: 64 year old female with recently diagnosed invasive ductal carcinoma and ductal carcinoma in situ of the right breast. The patient has a history of left lumpectomy and radiation in 2010.  LABS: BUN and creatinine were obtained on site at [facility] at  [address]  Results: BUN 18 mg/dL, Creatinine 0.7 mg/dL.  EXAM: BILATERAL BREAST MRI WITH AND WITHOUT CONTRAST  TECHNIQUE: Multiplanar, multisequence MR images of both breasts were obtained prior to and following the intravenous administration of 17 ml of MultiHance.  THREE-DIMENSIONAL MR IMAGE RENDERING ON INDEPENDENT WORKSTATION:  Three-dimensional MR images were rendered by post-processing of the original MR data on an independent workstation. The three-dimensional MR images were interpreted, and findings are reported in the following complete MRI report for this study. Three dimensional images were evaluated at the independent DynaCad workstation  COMPARISON: Multiple prior mammograms, the most recent dated 10/17/2014. Ultrasound dated 10/17/2014. MRI dated 10/02/2008.  FINDINGS: Breast composition: b. Scattered  fibroglandular tissue.  Background parenchymal enhancement: There is marked background enhancement within the right breast which appears overall similar to prior study dated 10/02/2008. There is minimal background enhancement within the left breast, consistent with postradiation change.  Right breast: Within the slightly lower, outer right breast there is an irregular enhancing mass measuring 2.9 x 2.6 x 2.4 cm, corresponding to the known biopsy proven malignancy within the right breast. Biopsy marker artifact is noted adjacent to the superior and lateral aspect of the mass, series 4, image 81. There is marked background enhancement as described above.  Left breast: Changes of prior left lumpectomy and left axillary nodal dissection are noted. No area of suspicious enhancement is noted within the left breast.  Lymph nodes: No abnormal appearing lymph nodes. There are changes of prior left axillary nodal dissection.  IMPRESSION: Known biopsy proven malignancy within the right breast.  RECOMMENDATION: Treatment plan.  BI-RADS CATEGORY 6: Known biopsy-proven malignancy.   Electronically Signed By: Pamelia Hoit M.D. On: 10/24/2014 10:49       Patient: Natalie Valdez Collected: 10/18/2014 Client: [facility] @ [address] Accession: [id number] Received: 10/18/2014 Natalie Slates, MD DOB: Apr 30, 1951 Age: 64 Gender: F Reported: 10/19/2014 [address] Patient Ph: [phone] MRN #: [id number] [place], [place] [address] Client Acc#: Chart #: [id number] Phone: [phone] Fax: CC: [facility] CC CC [facility] REPORT OF SURGICAL PATHOLOGY ADDITIONAL INFORMATION: PROGNOSTIC INDICATORS - ACIS Results: IMMUNOHISTOCHEMICAL AND MORPHOMETRIC ANALYSIS BY THE AUTOMATED CELLULAR IMAGING SYSTEM (ACIS) Estrogen Receptor: 100%, POSITIVE, STRONG STAINING INTENSITY Progesterone Receptor: 12%, POSITIVE, STRONG STAINING INTENSITY Proliferation Marker Ki67:  51% REFERENCE RANGE ESTROGEN RECEPTOR NEGATIVE <1% POSITIVE =>1% PROGESTERONE RECEPTOR NEGATIVE <1% POSITIVE =>1% All controls stained appropriately Enid Cutter MD Pathologist,  Electronic Signature ( Signed 10/24/2014) CHROMOGENIC IN-SITU HYBRIDIZATION Results: HER-2/NEU BY CISH - NEGATIVE. RESULT RATIO OF HER2: CEP 17 SIGNALS 1.40 AVERAGE HER2 COPY NUMBER PER CELL 2.10 REFERENCE RANGE 1 of 3 FINAL for SHAILAH, [name] ([id number]) ADDITIONAL INFORMATION:(continued) NEGATIVE HER2/Chr17 Ratio <2.0 and Average HER2 copy number <4.0 EQUIVOCAL HER2/Chr17 Ratio <2.0 and Average HER2 copy number 4.0 and <6.0 POSITIVE HER2/Chr17 Ratio >=2.0 and/or Average HER2 copy number >=6.0 Enid Cutter MD Pathologist, Electronic Signature ( Signed 10/23/2014) FINAL DIAGNOSIS Diagnosis Breast, right, needle core biopsy - INVASIVE DUCTAL CARCINOMA. - DUCTAL CARCINOMA IN SITU. - LYMPHOVASCULAR INVASION IS IDENTIFIED. - SEE COMMENT. Microscopic Comment The carcinoma appears grade III. A breast prognostic profile will be performed and the results reported separately. The results were called to [facility] on 10/19/14. (JBK:gt, 10/19/14) Enid Cutter MD Pathologist, Electronic Signature (Case signed 10/19/2014) Specimen Gross and Clinical Information Specimen Comment In formalin 10:26; new mass-very suspicious Specimen(s) Obtained: Breast, right, needle core biopsy Gross Received in formalin are 4 cores of soft, tan-red tissue which measure 0.9 x 0.1 x 0.1 cm and up to 1.4 x 0.1 x 0.1 cm. Time in Formalin 10:26 am. Submitted in toto in 1 block(s) Stain(s) used in Diagnosis: The following stain(s) were used in diagnosing the case: ER-ACIS, PR-ACIS, CISH, KI-67-ACIS. The control(s) stained appropriately. Disclaimer Her2Neu by CISH (chromogenic in-situ hybridization) is performed at [facility], using the [place] pharmDx Kit (code number [id number]). This test is used to detect  the amplification of the Her2Neu gene in interphase nuclei from formalin fixed, paraffin embedded tissue and is reported using ASCO/CAP scoring criteria published in 2013. PR progesterone receptor (PgR 636), immunohistochemical stains are performed on formalin fixed, paraffin embedded tissue using a 3,3"-diaminobenzidine (DAB) chromogen and DAKO Autostainer System. The staining intensity of the nucleus is scored morphometrically using the Automated Cellular Imaging System (ACIS) and is reported as the percentage of tumor cell nuclei demonstrating specific nuclear staining. Estrogen receptor (SP1), immunohistochemical stains are performed on formalin fixed, paraffin embedded tissue using a 3,3"-diaminobenzidine (DAB) chromogen and DAKO Autostainer System. The staining intensity of the nucleus is scored morphometrically using the Automated Cellular Imaging System (ACIS) and is reported as the percentage of tumor cell nuclei demonstrating specific nuclear staining. 2 of 3 FINAL for AISSATA, WILMORE ([id number]) Disclaimer(continued)                            CLINICAL DATA: 65 year old female with recently diagnosed invasive ductal carcinoma and ductal carcinoma in situ of the right breast. The patient has a history of left lumpectomy and radiation in 2010.  LABS: BUN and creatinine were obtained on site at [facility] at  [address]  Results: BUN 18 mg/dL, Creatinine 0.7 mg/dL.  EXAM: BILATERAL BREAST MRI WITH AND WITHOUT CONTRAST  TECHNIQUE: Multiplanar, multisequence MR images of both breasts were obtained prior to and following the intravenous administration of 17 ml of MultiHance.  THREE-DIMENSIONAL MR IMAGE RENDERING ON INDEPENDENT WORKSTATION:  Three-dimensional MR images were rendered by post-processing of the original MR data on an independent workstation. The three-dimensional MR images were interpreted, and findings  are reported in the following complete MRI report for this study. Three dimensional images were evaluated at the independent DynaCad workstation  COMPARISON: Multiple prior mammograms, the most recent dated 10/17/2014. Ultrasound dated 10/17/2014. MRI dated 10/02/2008.  FINDINGS: Breast composition: b. Scattered fibroglandular tissue.  Background parenchymal enhancement: There is marked background enhancement  within the right breast which appears overall similar to prior study dated 10/02/2008. There is minimal background enhancement within the left breast, consistent with postradiation change.  Right breast: Within the slightly lower, outer right breast there is an irregular enhancing mass measuring 2.9 x 2.6 x 2.4 cm, corresponding to the known biopsy proven malignancy within the right breast. Biopsy marker artifact is noted adjacent to the superior and lateral aspect of the mass, series 4, image 81. There is marked background enhancement as described above.  Left breast: Changes of prior left lumpectomy and left axillary nodal dissection are noted. No area of suspicious enhancement is noted within the left breast.  Lymph nodes: No abnormal appearing lymph nodes. There are changes of prior left axillary nodal dissection.  IMPRESSION: Known biopsy proven malignancy within the right breast.  RECOMMENDATION: Treatment plan.  BI-RADS CATEGORY 6: Known biopsy-proven malignancy.   Electronically Signed By: Pamelia Hoit M.D. On: 10/24/2014 10:49         ADDITIONAL INFORMATION: PROGNOSTIC INDICATORS - ACIS Results: IMMUNOHISTOCHEMICAL AND MORPHOMETRIC ANALYSIS BY THE AUTOMATED CELLULAR IMAGING SYSTEM (ACIS) Estrogen Receptor: 100%, POSITIVE, STRONG STAINING INTENSITY Progesterone Receptor: 12%, POSITIVE, STRONG STAINING INTENSITY Proliferation Marker Ki67: 51% REFERENCE RANGE ESTROGEN RECEPTOR NEGATIVE <1% POSITIVE =>1% PROGESTERONE RECEPTOR NEGATIVE  <1% POSITIVE =>1% All controls stained appropriately Enid Cutter MD Pathologist, Electronic Signature ( Signed 10/24/2014) CHROMOGENIC IN-SITU HYBRIDIZATION Results: HER-2/NEU BY CISH - NEGATIVE. RESULT RATIO OF HER2: CEP 17 SIGNALS 1.40 AVERAGE HER2 COPY NUMBER PER CELL 2.10 REFERENCE RANGE 1 of 3 FINAL for SUZIE, VANDAM ([id number]) ADDITIONAL INFORMATION:(continued) NEGATIVE HER2/Chr17 Ratio <2.0 and Average HER2 copy number <4.0 EQUIVOCAL HER2/Chr17 Ratio <2.0 and Average HER2 copy number 4.0 and <6.0 POSITIVE HER2/Chr17 Ratio >=2.0 and/or Average HER2 copy number >=6.0 Enid Cutter MD Pathologist, Electronic Signature ( Signed 10/23/2014) FINAL DIAGNOSIS Diagnosis Breast, right, needle core biopsy - INVASIVE DUCTAL CARCINOMA. - DUCTAL CARCINOMA IN SITU. - LYMPHOVASCULAR INVASION IS IDENTIFIED. - SEE COMMENT. Microscopic Comment The carcinoma appears grade III. A breast prognostic profile will be performed and the results reported separately. The results were called to [facility] on 10/19/14. (JBK:gt, 10/19/14) Enid Cutter MD Pathologist, Electronic Signature (Case signed 10/19/2014) Specimen Gross and Clinical Information Specimen Comment In formalin 10:26; new mass-very suspicious Specimen(s) Obtained: Breast, right, needle core biopsy Gross Received in formalin are 4 cores of soft, tan-red tissue which measure 0.9 x 0.1 x 0.1 cm and up to 1.4 x 0.1 x 0.1 cm. Time in Formalin 10:26 am. Submitted in toto in 1 block(s) Stain(s) used in Diagnosis: The following stain(s) were used in diagnosing the case: ER-ACIS, PR-ACIS, CISH, KI-67-ACIS. The control(s) stained appropriately. Disclaimer Her2Neu by CISH (chromogenic in-situ hybridization) is performed at [facility], using the [place] pharmDx Kit (code number [id number]). This test is used to detect the amplification of the Her2Neu gene in interphase nuclei from formalin fixed, paraffin embedded  tissue and is reported using ASCO/CAP scoring criteria published in 2013. PR progesterone receptor (PgR 636), immunohistochemical stains are performed on formalin fixed, paraffin embedded tissue using a 3,3"-diaminobenzidine (DAB) chromogen and DAKO Autostainer System. The staining intensity of the nucleus is scored morphometrically using the Automated Cellular Imaging System (ACIS) and is reported as the percentage of tumor cell nuclei demonstrating specific nuclear staining. Estrogen receptor (SP1), immunohistochemical stains are performed on formalin fixed, paraffin embedded tissue using a 3,3"-diaminobenzidine (DAB) chromogen and DAKO Autostainer System. The staining intensity of the nucleus is scored morphometrically using the Automated Cellular Imaging System (  ACIS) and is reported as the percentage of tumor cell nuclei demonstrating specific nuclear staining. 2 of 3 FINAL for KIMBELY, WHITEAKER [phone]) Disclaimer(continued) Ki-67 (Mib-1), immunohistochemical stains are performed on formalin fixed, paraffin embedded tissue using a 3,3"-diaminobenzidine (DAB) chromogen and DAKO Autostainer System. The staining intensity of the nucleus is scored morphometrically using the Automated Cellular Imaging System (ACIS) and is reported as the percentage of tumor cell nuclei demonstrating specific nuclear staining. Report signed out from the following location(s) Technical Component was performed at [facility]. [address],[place] [address].[id number]:[id number]., Technical component and interpretation was performed at [place] [address], [place],  [address]. CLIA #: [id number], 3 of.  The patient is a 64 year old female   Other Problems Elbert Ewings, CMA; 11/02/2014 9:31 AM) Anxiety Disorder Arthritis Back Pain Breast Cancer Depression Gastroesophageal Reflux Disease General anesthesia - complications Lump In  Breast  Past Surgical History Elbert Ewings, CMA; 11/02/2014 9:31 AM) Breast Augmentation Bilateral. Breast Biopsy Bilateral. Breast Mass; Local Excision Left. Cesarean Section - 1 Hysterectomy (not due to cancer) - Partial  Diagnostic Studies History Elbert Ewings, CMA; 11/02/2014 9:31 AM) Colonoscopy never Mammogram within last year Pap Smear 1-5 years ago  Allergies Elbert Ewings, CMA; 11/02/2014 9:32 AM) No Known Drug Allergies 11/02/2014  Medication History Elbert Ewings, CMA; 11/02/2014 9:32 AM) TraMADol HCl (50MG Tablet, Oral) Active. Zolpidem Tartrate (10MG Tablet, Oral) Active. Celecoxib (200MG Capsule, Oral) Active. Citalopram Hydrobromide (20MG Tablet, Oral) Active. Restasis (0.05% Emulsion, Ophthalmic) Active. Vitamin D (Ergocalciferol) (50000UNIT Capsule, Oral) Active. Medications Reconciled  Social History Elbert Ewings, [place]; 11/02/2014 9:31 AM) Alcohol use Occasional alcohol use. Caffeine use Carbonated beverages. No drug use Tobacco use Never smoker.  Family History Elbert Ewings, [place]; 11/02/2014 9:31 AM) Arthritis Father. Breast Cancer Mother. Diabetes Mellitus Father. Migraine Headache Daughter.  Pregnancy / Birth History Elbert Ewings, CMA; 11/02/2014 9:31 AM) Age at menarche 82 years. Age of menopause 30-55 Gravida 4 Irregular periods Maternal age 42-25 Para 4     Review of Systems Elbert Ewings CMA; 11/02/2014 9:31 AM) General Present- Fatigue. Not Present- Appetite Loss, Chills, Fever, Night Sweats, Weight Gain and Weight Loss. Skin Present- New Lesions. Not Present- Change in Wart/Mole, Dryness, Hives, Jaundice, Non-Healing Wounds, Rash and Ulcer. HEENT Present- Wears glasses/contact lenses. Not Present- Earache, Hearing Loss, Hoarseness, Nose Bleed, Oral Ulcers, Ringing in the Ears, Seasonal Allergies, Sinus Pain, Sore Throat, Visual Disturbances and Yellow Eyes. Respiratory Not Present- Bloody sputum, Chronic Cough, Difficulty  Breathing, Snoring and Wheezing. Breast Present- Breast Mass. Not Present- Breast Pain, Nipple Discharge and Skin Changes. Cardiovascular Present- Palpitations. Not Present- Chest Pain, Difficulty Breathing Lying Down, Leg Cramps, Rapid Heart Rate, Shortness of Breath and Swelling of Extremities. Gastrointestinal Not Present- Abdominal Pain, Bloating, Bloody Stool, Change in Bowel Habits, Chronic diarrhea, Constipation, Difficulty Swallowing, Excessive gas, Gets full quickly at meals, Hemorrhoids, Indigestion, Nausea, Rectal Pain and Vomiting. Female Genitourinary Not Present- Frequency, Nocturia, Painful Urination, Pelvic Pain and Urgency. Musculoskeletal Present- Back Pain. Not Present- Joint Pain, Joint Stiffness, Muscle Pain, Muscle Weakness and Swelling of Extremities. Neurological Not Present- Decreased Memory, Fainting, Headaches, Numbness, Seizures, Tingling, Tremor, Trouble walking and Weakness. Psychiatric Not Present- Anxiety, Bipolar, Change in Sleep Pattern, Depression, Fearful and Frequent crying. Endocrine Not Present- Cold Intolerance, Excessive Hunger, Hair Changes, Heat Intolerance, Hot flashes and New Diabetes. Hematology Not Present- Easy Bruising, Excessive bleeding, Gland problems, HIV and Persistent Infections.  Vitals Elbert Ewings CMA; 11/02/2014 9:32 AM) 11/02/2014 9:31 AM Weight:  189 lb Height: 65in Body Surface Area: 1.98 m Body Mass Index: 31.45 kg/m Temp.: 98.50F(Oral)  Pulse: 95 (Regular)  Resp.: 18 (Unlabored)  BP: 128/70 (Sitting, Left Arm, Standard)     Physical Exam (Kagan Mutchler A. Andalyn Heckstall MD; 11/02/2014 11:01 AM)  General Mental Status-Alert. General Appearance-Consistent with stated age. Hydration-Well hydrated. Voice-Normal.  Head and Neck Head-normocephalic, atraumatic with no lesions or palpable masses. Trachea-midline. Thyroid Gland Characteristics - normal size and consistency.  Eye Eyeball - Bilateral-Extraocular  movements intact. Sclera/Conjunctiva - Bilateral-No scleral icterus.  Chest and Lung Exam Chest and lung exam reveals -quiet, even and easy respiratory effort with no use of accessory muscles and on auscultation, normal breath sounds, no adventitious sounds and normal vocal resonance. Inspection Chest Wall - Normal. Back - normal.  Breast Note: breast reduction scars noted. right breast upper outer quadrant 2 cm mobile tumor. right axillla normal.. left breast lumpectomy site without mass. slightly smaller left breast.   Cardiovascular Cardiovascular examination reveals -normal heart sounds, regular rate and rhythm with no murmurs and normal pedal pulses bilaterally.  Neurologic Neurologic evaluation reveals -alert and oriented x 3 with no impairment of recent or remote memory. Mental Status-Normal.  Musculoskeletal Normal Exam - Left-Upper Extremity Strength Normal and Lower Extremity Strength Normal. Normal Exam - Right-Upper Extremity Strength Normal and Lower Extremity Strength Normal.  Lymphatic Head & Neck  General Head & Neck Lymphatics: Bilateral - Description - Normal. Axillary  General Axillary Region: Bilateral - Description - Normal. Tenderness - Non Tender.    Assessment & Plan (Ola Fawver A. Sweet Jarvis MD; 11/02/2014 11:03 AM)  BREAST CANCER, RIGHT (174.9  C50.911) Impression: 2.9 cm by MRI but large enogh breasts to have lumpectomy first with minimal risk of cosmetic deformity. Seeing oncology next week. Discussed right breast partial mastectomy and SLN mapping as well as mastectomy. pt desires breast conservation. I dont think neoadjuvant chemotherapy necessary given her breast size. Risk of lumpectomy include bleeding, infection, seroma, more surgery, use of seed/wire, wound care, cosmetic deformity and the need for other treatments, death , blood clots, death. Pt agrees to proceed. Risk of sentinel lymph node mapping include bleeding, infection,  lymphedema, shoulder pain. stiffness, dye allergy. cosmetic deformity , blood clots, death, need for more surgery. Pt agres to proceed.  Current Plans Referred to Genetic Counseling, for evaluation and follow up (Medical Genetics). Referred to Oncology, for evaluation and follow up (Oncology). Referred to Radiation Oncology, for evaluation and follow up (Radiation Oncology). Referred to Physical Therapy, for evaluation and follow up (Physical Therapy). Pt Education - [facility] Breast Biopsy HCI Pt Education - flb breast cancer surgery: discussed with patient and provided information. Pt Education - abc class: discussed with patient and provided information. HISTORY OF LEFT BREAST CANCER (V10.3  Z85.3) Impression: 2010 had breast conservation and ALND due to inability to map. refused tamoxifen back then

## 2014-11-07 ENCOUNTER — Ambulatory Visit: Payer: 59

## 2014-11-07 ENCOUNTER — Encounter: Payer: Self-pay | Admitting: Hematology and Oncology

## 2014-11-07 ENCOUNTER — Ambulatory Visit (HOSPITAL_BASED_OUTPATIENT_CLINIC_OR_DEPARTMENT_OTHER): Payer: 59 | Admitting: Hematology and Oncology

## 2014-11-07 ENCOUNTER — Encounter: Payer: Self-pay | Admitting: Radiation Oncology

## 2014-11-07 VITALS — BP 112/55 | HR 86 | Temp 98.7°F | Resp 18 | Ht 64.5 in | Wt 189.8 lb

## 2014-11-07 DIAGNOSIS — C50511 Malignant neoplasm of lower-outer quadrant of right female breast: Secondary | ICD-10-CM | POA: Diagnosis not present

## 2014-11-07 DIAGNOSIS — Z853 Personal history of malignant neoplasm of breast: Secondary | ICD-10-CM

## 2014-11-07 DIAGNOSIS — Z170 Estrogen receptor positive status [ER+]: Secondary | ICD-10-CM | POA: Diagnosis not present

## 2014-11-07 NOTE — Progress Notes (Signed)
Checked in new pt with no financial concerns prior to seeing the dr.

## 2014-11-07 NOTE — Assessment & Plan Note (Signed)
Right breast invasive ductal carcinoma with DCIS 2.9 cm by MRI, T2 N0 M0 clinical stage, ER 100%, PR 12%, HER-2 negative, Ki-67 51%, grade 3  Pathology and radiology review:Discussed with the patient, the details of pathology including the type of breast cancer,the clinical staging, the significance of ER, PR and HER-2/neu receptors and the implications for treatment. After reviewing the pathology in detail, we proceeded to discuss the different treatment options between surgery, radiation, chemotherapy, antiestrogen therapies.  Recommendation based on multidisciplinary tumor board: 1. Breast conserving surgery 2. Oncotype DX to determine benefit to chemotherapy and to determine prognosis 3. Adjuvant radiation therapy followed by 4. Anti-estrogen therapy  Patient had BRCA1 and 2 tested in 2010 which were apparently negative. I recommended genetic counseling appointment to discuss if extended genetic testing would be appropriate.  Return to clinic 1 week after surgery to discuss final pathology report and decide on testing Oncotype DX.

## 2014-11-07 NOTE — Progress Notes (Signed)
Location of Breast Cancer: Invasive Ductal Carcinoma of the Breast, Lower Outer Quadrant  Histology per Pathology Report:   10/18/14 Diagnosis Breast, right, needle core biopsy - INVASIVE DUCTAL CARCINOMA. - DUCTAL CARCINOMA IN SITU. - LYMPHOVASCULAR INVASION IS IDENTIFIED  Receptor Status: ER(100%), PR (12%), Her2-neu (Neg), Ki-67(51%)  [name]  noticed small lump lateral right breast. Mammogram on 10/17/14 and U/S on 10/17/14 showed i.6 cm mass core bx shown to be IDC/DCIS  Past/Anticipated interventions by surgeon, if any: Dr. Tobe Sos- Needle Core Biopsy  Past/Anticipated interventions by medical oncology, if any: Dr. Nicholas Lose :Oncotype DX to determine benefit to chemotherapy    Lymphedema issues, if any: NO   Pain issues, if any: Denies pain  SAFETY ISSUES:  Prior radiation? Left Breast 2010-  Pacemaker/ICD? NO  Possible current pregnancy? NO  Is the patient on methotrexate? NO  Current Complaints / other details:    History of left breast DCIS status post lumpectomy and axillary lymph node dissection due to failed localization of sentinel node April 2010  She menarched at early age of 49  She had 4 pregnancy, her first child was born at age 57  She has received birth control pills for approximately one year.  She was never exposed to fertility medications or hormone replacement therapy.  She has family history of Breast/GYN/GI cancer Her mom was diagnosed breast cancer and paternal grandmother had ovarian cancer    Natalie Valdez Natalie Curb, RN 11/07/2014,5:05 PM

## 2014-11-07 NOTE — Progress Notes (Signed)
[facility] NOTE  Patient Care Team: Jonathon Jordan, MD as PCP - General (Family Medicine)  CHIEF COMPLAINTS/PURPOSE OF CONSULTATION:  Newly diagnosed right breast cancer  HISTORY OF PRESENTING ILLNESS:  AIRIANA Valdez 64 y.o. female is here because of recent diagnosis of right breast cancer. She has a prior history of DCIS involving the left breast treated with lumpectomy and she refused adjuvant hormone therapy. During the lumpectomy because of dye did not take up and axilla to had to do an axillary lymph node dissection. She presented for her annual mammogram that revealed abnormalities in the right breast which was then evaluated by ultrasound and was found to have 2.5 cm tumor. She then underwent a biopsy that confirmed invasive ductal carcinoma with DCIS grade 3 with lymphovascular invasion ER/PR positive HER-2 negative with a Ki-67 of 51%. She underwent a breast MRI that revealed a 2.9 Sinemet or tumor. She was presented at the multidisciplinary breast tumor board and she is here today to discuss oncology care.  I reviewed her records extensively and collaborated the history with the patient.  SUMMARY OF ONCOLOGIC HISTORY:   Breast cancer of lower-outer quadrant of right female breast   11/06/2008 Surgery Left breast lumpectomy: High-grade DCIS 2.8 cm, 18 lymph nodes negative, ER 98%, PR 81%, stage 0, patient refused antiestrogen therapy   10/18/2014 Initial Biopsy Right breast invasive ductal carcinoma with DCIS grade 3, lymphovascular invasion, ER 100%, PR 12%, HER-2 negative ratio 1.4, Ki-67 51%, T2 N0 M0 stage II a clinical stage   10/24/2014 Breast MRI Right breast irregular enhancing mass 2.9 x 2.6 x 2.4 cm, left breast evidence of prior lumpectomy and left axillary dissection    In terms of breast cancer risk profile:  She menarched at early age of 71   She had 4 pregnancy, her first child was born at age 63  She has received birth control pills for  approximately one year.  She was never exposed to fertility medications or hormone replacement therapy.  She has  family history of Breast/GYN/GI cancer Her mom was diagnosed breast cancer and paternal grandmother had ovarian cancer  MEDICAL HISTORY:  Past Medical History  Diagnosis Date  . Cancer     breast  . Heart murmur   . Generalized headaches   . Arthritis     SURGICAL HISTORY: Past Surgical History  Procedure Laterality Date  . Cesarean section    . Dilation and curettage of uterus  1999  . Abdominal hysterectomy    . Breast surgery  2010    lumpectomy    SOCIAL HISTORY: History   Social History  . Marital Status: Married    Spouse Name: N/A  . Number of Children: N/A  . Years of Education: N/A   Occupational History  . Not on file.   Social History Main Topics  . Smoking status: Never Smoker   . Smokeless tobacco: Never Used  . Alcohol Use: Yes     Comment: very little - monthly  . Drug Use: No  . Sexual Activity: Not on file   Other Topics Concern  . Not on file   Social History Narrative  . No narrative on file    FAMILY HISTORY: Family History  Problem Relation Age of Onset  . Cancer Mother     breast  . Cancer Paternal Grandmother     ovarian    ALLERGIES:  has No Known Allergies.  MEDICATIONS:  Current Outpatient Prescriptions  Medication Sig  Dispense Refill  . Ascorbic Acid (VITAMIN C PO) Take by mouth.      . Cholecalciferol (VITAMIN D PO) Take by mouth.      . citalopram (CELEXA) 10 MG tablet TAKE 1 TABLET BY MOUTH DAILY 90 tablet 0  . Cyanocobalamin (VITAMIN B-12 PO) Take by mouth.      Marland Kitchen GLUCOSAMINE PO Take by mouth.      . Omega-3 Fatty Acids (FISH OIL PO) Take by mouth.      Marland Kitchen VAGIFEM 10 MCG TABS      No current facility-administered medications for this visit.    REVIEW OF SYSTEMS:   Constitutional: Denies fevers, chills or abnormal night sweats Eyes: Denies blurriness of vision, double vision or watery eyes Ears,  nose, mouth, throat, and face: Denies mucositis or sore throat Respiratory: Denies cough, dyspnea or wheezes Cardiovascular: Denies palpitation, chest discomfort or lower extremity swelling Gastrointestinal:  Denies nausea, heartburn or change in bowel habits Skin: Denies abnormal skin rashes Lymphatics: Denies new lymphadenopathy or easy bruising Neurological:Denies numbness, tingling or new weaknesses Behavioral/Psych: Mood is stable, no new changes  Breast:  Denies any palpable lumps or discharge All other systems were reviewed with the patient and are negative.  PHYSICAL EXAMINATION: ECOG PERFORMANCE STATUS: 0 - Asymptomatic  Filed Vitals:   11/07/14 1501  BP: 112/55  Pulse: 86  Temp: 98.7 F (37.1 C)  Resp: 18   Filed Weights   11/07/14 1501  Weight: 189 lb 12.8 oz (86.093 kg)    GENERAL:alert, no distress and comfortable SKIN: skin color, texture, turgor are normal, no rashes or significant lesions EYES: normal, conjunctiva are pink and non-injected, sclera clear OROPHARYNX:no exudate, no erythema and lips, buccal mucosa, and tongue normal  NECK: supple, thyroid normal size, non-tender, without nodularity LYMPH:  no palpable lymphadenopathy in the cervical, axillary or inguinal LUNGS: clear to auscultation and percussion with normal breathing effort HEART: regular rate & rhythm and no murmurs and no lower extremity edema ABDOMEN:abdomen soft, non-tender and normal bowel sounds Musculoskeletal:no cyanosis of digits and no clubbing  PSYCH: alert & oriented x 3 with fluent speech NEURO: no focal motor/sensory deficits BREAST: No palpable nodules in breast. No palpable axillary or supraclavicular lymphadenopathy (exam performed in the presence of a chaperone) evidence of prior surgery on the left breast and axilla  LABORATORY DATA:  I have reviewed the data as listed Lab Results  Component Value Date   WBC 8.4 09/21/2009   HGB * 09/21/2009    10.8 DELTA CHECK NOTED  REPEATED TO VERIFY PHONED M ROBINSON 09/21/09 0655 BY A POTEAT   HCT 31.6* 09/21/2009   MCV 88.4 09/21/2009   PLT 168 09/21/2009   Lab Results  Component Value Date   NA 139 09/19/2009   K 4.1 09/19/2009   CL 105 09/19/2009   CO2 29 09/19/2009    RADIOGRAPHIC STUDIES: I have personally reviewed the radiological reports and agreed with the findings in the report. Results as summarized as above  ASSESSMENT AND PLAN:  Breast cancer of lower-outer quadrant of right female breast Right breast invasive ductal carcinoma with DCIS 2.9 cm by MRI, T2 N0 M0 clinical stage, ER 100%, PR 12%, HER-2 negative, Ki-67 51%, grade 3  Pathology and radiology review:Discussed with the patient, the details of pathology including the type of breast cancer,the clinical staging, the significance of ER, PR and HER-2/neu receptors and the implications for treatment. After reviewing the pathology in detail, we proceeded to discuss the different treatment options  between surgery, radiation, chemotherapy, antiestrogen therapies.  Recommendation based on multidisciplinary tumor board: 1. Breast conserving surgery 2. Oncotype DX to determine benefit to chemotherapy and to determine prognosis 3. Adjuvant radiation therapy followed by 4. Anti-estrogen therapy  Patient had BRCA1 and 2 tested in 2010 which were apparently negative. I recommended genetic counseling appointment to discuss if extended genetic testing would be appropriate.  Return to clinic 1 week after surgery to discuss final pathology report and decide on testing Oncotype DX.    All questions were answered. The patient knows to call the clinic with any problems, questions or concerns.    Rulon Eisenmenger, MD 5:03 PM

## 2014-11-08 ENCOUNTER — Ambulatory Visit
Admission: RE | Admit: 2014-11-08 | Discharge: 2014-11-08 | Disposition: A | Discharge status: Home / Self Care | Payer: 59 | Source: Ambulatory Visit | Attending: Radiation Oncology | Admitting: Radiation Oncology

## 2014-11-08 ENCOUNTER — Ambulatory Visit: Payer: 59

## 2014-11-08 ENCOUNTER — Encounter: Payer: Self-pay | Admitting: Radiation Oncology

## 2014-11-08 VITALS — BP 125/69 | HR 70 | Temp 97.9°F | Ht 64.5 in | Wt 191.9 lb

## 2014-11-08 DIAGNOSIS — C50511 Malignant neoplasm of lower-outer quadrant of right female breast: Secondary | ICD-10-CM | POA: Insufficient documentation

## 2014-11-08 HISTORY — DX: Malignant neoplasm of unspecified site of left female breast: C50.912

## 2014-11-08 HISTORY — DX: Malignant neoplasm of unspecified site of right female breast: C50.911

## 2014-11-08 NOTE — Progress Notes (Signed)
[facility] NEW PATIENT EVALUATION  Name: Natalie Valdez MRN: [id number]  Date:   11/08/2014           DOB: 01-05-1951  Status: outpatient   CC: Lilian Coma, MD  Dr. Erroll Luna   REFERRING PHYSICIAN: Dr. Erroll Luna  DIAGNOSIS: Clinical stage IIA (T2 N0 M0) invasive ductal/DCIS of the right breast   HISTORY OF PRESENT ILLNESS:  Natalie Valdez is a 64 y.o. female who is seen today for evaluation of her T2 N0 invasive ductal/DCIS of the right breast.  Of note is that she completed a course of radiation therapy following conservative surgery for high-grade DCIS of the left breast in August 2010.  She had an axillary node dissection his since a sentinel lymph node could not be identified.  She did not take adjuvant antiestrogen therapy.  At the time of a screening mammogram at [facility] on 10/17/2014 she was felt to have a 1.6 cm mass within the right breast at 11:00 (actually at 8:30 on palpation) and a 0.7 centimeter nodule at 3:00. Ultrasound also showed a 0.9 cm cyst at 3:00 and a 2.5 cm solid mass at "11:00", posterior depth.  The mass was biopsied on 10/18/2014 and found to be diagnostic for invasive ductal/DCIS.  LV I was seen.  The tumor was ER positive at 100%, PR positive at 12% and HER-2/neu negative.  Ki-67 was elevated at 51%.  Breast MR on 10/23/2014 showed her biopsy-proven malignancy without other significant changes.  She was recently seen by Dr. Lindi Adie in medical oncology.  He plans on obtaining Oncotype DX testing.  Dr. Brantley Stage plans on performing a right partial mastectomy and sentinel lymph node biopsy on May 3.  She is being considered for further genetic testing even though she was negative when she was diagnosed back in 2010.  She is without complaints today.  PREVIOUS RADIATION THERAPY: Status post 5000 cGy 25 sessions to the left breast followed by an electron beam boost for an additional 1000 cGy in 5 sessions, completing all  radiation therapy on 03/22/2009.   PAST MEDICAL HISTORY:  has a past medical history of Breast cancer, left breast (11/06/2008); Heart murmur; Generalized headaches; Arthritis; and Breast cancer, right breast (10/18/2014).     PAST SURGICAL HISTORY:  Past Surgical History  Procedure Laterality Date  . Cesarean section    . Dilation and curettage of uterus  1999  . Abdominal hysterectomy    . Breast surgery  2010    lumpectomy     FAMILY HISTORY: family history includes Breast cancer in her mother; Ovarian cancer in her paternal grandmother.  Her mother was diagnosed with breast cancer at age 25.   SOCIAL HISTORY:  reports that she has never smoked. She has never used smokeless tobacco. She reports that she drinks alcohol. She reports that she does not use illicit drugs. Married, 3 biological children, 2 adopted children, and 2 foster children.   ALLERGIES: Review of patient's allergies indicates no known allergies.   MEDICATIONS:  Current Outpatient Prescriptions  Medication Sig Dispense Refill  . celecoxib (CELEBREX) 200 MG capsule Take 200 mg by mouth 1 day or 1 dose.    . citalopram (CELEXA) 10 MG tablet TAKE 1 TABLET BY MOUTH DAILY (Patient taking differently: TAKE 2 TABLET BY MOUTH DAILY) 90 tablet 0  . citalopram (CELEXA) 20 MG tablet   11  . RESTASIS 0.05 % ophthalmic emulsion   9  . Vitamin D, Ergocalciferol, (DRISDOL)  50000 UNITS CAPS capsule 5,000 Units.   0  . zolpidem (AMBIEN) 10 MG tablet   5   No current facility-administered medications for this encounter.     REVIEW OF SYSTEMS:  Pertinent items are noted in HPI.    PHYSICAL EXAM:  height is 5' 4.5" (1.638 m) and weight is 191 lb 14.4 oz (87.045 kg). Her temperature is 97.9 F (36.6 C). Her blood pressure is 125/69 and her pulse is 70.   Alert and oriented.  Head and neck examination: Grossly unremarkable.  Nodes: Without palpable cervical, supraclavicular, or axillary lymphadenopathy.  Chest: Lungs clear.   Breasts: There are breast reduction scars.  There is a palpable mass measuring approximately 2 cm at 8:30 within the right breast.  No other masses are appreciated.  Left breast without masses or lesions.  Extremities: Without edema   LABORATORY DATA:  Lab Results  Component Value Date   WBC 8.4 09/21/2009   HGB * 09/21/2009    10.8 DELTA CHECK NOTED REPEATED TO VERIFY PHONED M ROBINSON 09/21/09 0655 BY A POTEAT   HCT 31.6* 09/21/2009   MCV 88.4 09/21/2009   PLT 168 09/21/2009   Lab Results  Component Value Date   NA 139 09/19/2009   K 4.1 09/19/2009   CL 105 09/19/2009   CO2 29 09/19/2009   Lab Results  Component Value Date   ALT 14 03/25/2009   AST 15 03/25/2009   ALKPHOS 90 03/25/2009   BILITOT 0.4 03/25/2009      IMPRESSION: Clinical stage II A (T2 N0 M0) invasive ductal/DCIS of the right breast.  We again discussed the potential local regional management options which include mastectomy versus partial mastectomy (along with a sentinel lymph node biopsy) followed by radiation therapy.  She desires breast preservation.  I reviewed her previous radiation therapy records, and she can certainly receive right breast radiation.  We discussed the potential acute and late toxicities.  She is scheduled for surgery on May 3.  In the meantime, she will undergo genetic testing.  She understands that she may be offered adjuvant chemotherapy prior to definitive radiation therapy.   PLAN: As discussed above.   I spent 60  minutes face to face with the patient and more than 50% of that time was spent in counseling and/or coordination of care.

## 2014-11-08 NOTE — Progress Notes (Deleted)
Please see the Nurse Progress Note in the MD Initial Consult Encounter for this patient. 

## 2014-11-15 ENCOUNTER — Telehealth: Payer: Self-pay

## 2014-11-15 NOTE — Telephone Encounter (Signed)
Order faxed to [facility].  Sent to scan. 

## 2014-11-22 ENCOUNTER — Encounter (HOSPITAL_BASED_OUTPATIENT_CLINIC_OR_DEPARTMENT_OTHER): Payer: Self-pay | Admitting: *Deleted

## 2014-11-22 NOTE — Progress Notes (Signed)
Will come in for labs after seeds 11/26/14

## 2014-11-26 ENCOUNTER — Encounter (HOSPITAL_BASED_OUTPATIENT_CLINIC_OR_DEPARTMENT_OTHER)
Admission: RE | Admit: 2014-11-26 | Discharge: 2014-11-26 | Disposition: A | Discharge status: Home / Self Care | Payer: 59 | Source: Ambulatory Visit | Attending: Surgery | Admitting: Surgery

## 2014-11-26 DIAGNOSIS — C50911 Malignant neoplasm of unspecified site of right female breast: Secondary | ICD-10-CM | POA: Diagnosis present

## 2014-11-26 DIAGNOSIS — M1990 Unspecified osteoarthritis, unspecified site: Secondary | ICD-10-CM | POA: Diagnosis not present

## 2014-11-26 DIAGNOSIS — C779 Secondary and unspecified malignant neoplasm of lymph node, unspecified: Secondary | ICD-10-CM | POA: Diagnosis not present

## 2014-11-27 ENCOUNTER — Ambulatory Visit (HOSPITAL_BASED_OUTPATIENT_CLINIC_OR_DEPARTMENT_OTHER): Payer: 59 | Admitting: Anesthesiology

## 2014-11-27 ENCOUNTER — Encounter (HOSPITAL_BASED_OUTPATIENT_CLINIC_OR_DEPARTMENT_OTHER): Payer: Self-pay | Admitting: *Deleted

## 2014-11-27 ENCOUNTER — Encounter (HOSPITAL_BASED_OUTPATIENT_CLINIC_OR_DEPARTMENT_OTHER)
Admission: RE | Disposition: A | Discharge status: Home / Self Care | Payer: Self-pay | Source: Ambulatory Visit | Attending: Surgery

## 2014-11-27 ENCOUNTER — Encounter (HOSPITAL_COMMUNITY)
Admission: RE | Admit: 2014-11-27 | Discharge: 2014-11-27 | Disposition: A | Discharge status: Home / Self Care | Payer: 59 | Source: Ambulatory Visit | Attending: Surgery | Admitting: Surgery

## 2014-11-27 ENCOUNTER — Ambulatory Visit (HOSPITAL_BASED_OUTPATIENT_CLINIC_OR_DEPARTMENT_OTHER)
Admission: RE | Admit: 2014-11-27 | Discharge: 2014-11-27 | Disposition: A | Discharge status: Home / Self Care | Payer: 59 | Source: Ambulatory Visit | Attending: Surgery | Admitting: Surgery

## 2014-11-27 DIAGNOSIS — C50911 Malignant neoplasm of unspecified site of right female breast: Secondary | ICD-10-CM

## 2014-11-27 DIAGNOSIS — C779 Secondary and unspecified malignant neoplasm of lymph node, unspecified: Secondary | ICD-10-CM | POA: Insufficient documentation

## 2014-11-27 DIAGNOSIS — M1990 Unspecified osteoarthritis, unspecified site: Secondary | ICD-10-CM | POA: Insufficient documentation

## 2014-11-27 HISTORY — DX: Other specified postprocedural states: R11.2

## 2014-11-27 HISTORY — PX: BREAST LUMPECTOMY WITH RADIOACTIVE SEED AND SENTINEL LYMPH NODE BIOPSY: SHX6550

## 2014-11-27 HISTORY — DX: Other specified postprocedural states: Z98.890

## 2014-11-27 LAB — CBC WITH DIFFERENTIAL/PLATELET
Basophils Absolute: 0 10*3/uL (ref 0.0–0.1)
Basophils Relative: 0 % (ref 0–1)
EOS ABS: 0.1 10*3/uL (ref 0.0–0.7)
EOS PCT: 1 % (ref 0–5)
HCT: 41.4 % (ref 36.0–46.0)
Hemoglobin: 13.3 g/dL (ref 12.0–15.0)
LYMPHS ABS: 1.9 10*3/uL (ref 0.7–4.0)
Lymphocytes Relative: 28 % (ref 12–46)
MCH: 28.1 pg (ref 26.0–34.0)
MCHC: 32.1 g/dL (ref 30.0–36.0)
MCV: 87.5 fL (ref 78.0–100.0)
MONO ABS: 0.6 10*3/uL (ref 0.1–1.0)
Monocytes Relative: 9 % (ref 3–12)
Neutro Abs: 4.3 10*3/uL (ref 1.7–7.7)
Neutrophils Relative %: 62 % (ref 43–77)
PLATELETS: 283 10*3/uL (ref 150–400)
RBC: 4.73 MIL/uL (ref 3.87–5.11)
RDW: 13.2 % (ref 11.5–15.5)
WBC: 7 10*3/uL (ref 4.0–10.5)

## 2014-11-27 LAB — COMPREHENSIVE METABOLIC PANEL
ALBUMIN: 3.9 g/dL (ref 3.5–5.0)
ALK PHOS: 90 U/L (ref 38–126)
ALT: 15 U/L (ref 14–54)
ANION GAP: 10 (ref 5–15)
AST: 17 U/L (ref 15–41)
BUN: 17 mg/dL (ref 6–20)
CALCIUM: 9.3 mg/dL (ref 8.9–10.3)
CO2: 25 mmol/L (ref 22–32)
Chloride: 103 mmol/L (ref 101–111)
Creatinine, Ser: 0.83 mg/dL (ref 0.44–1.00)
GFR calc Af Amer: >60 mL/min (ref >60)
GFR calc non Af Amer: >60 mL/min (ref >60)
GLUCOSE: 87 mg/dL (ref 70–99)
POTASSIUM: 4.6 mmol/L (ref 3.5–5.1)
SODIUM: 138 mmol/L (ref 135–145)
TOTAL PROTEIN: 7.3 g/dL (ref 6.5–8.1)
Total Bilirubin: 0.3 mg/dL (ref 0.3–1.2)

## 2014-11-27 LAB — POCT HEMOGLOBIN-HEMACUE: Hemoglobin: 13.5 g/dL (ref 12.0–15.0)

## 2014-11-27 SURGERY — BREAST LUMPECTOMY WITH RADIOACTIVE SEED AND SENTINEL LYMPH NODE BIOPSY
Anesthesia: General | Site: Breast | Laterality: Right

## 2014-11-27 MED ORDER — HYDROMORPHONE HCL 1 MG/ML IJ SOLN: 0.2500 mg | INTRAMUSCULAR | Status: DC | PRN

## 2014-11-27 MED ORDER — SODIUM CHLORIDE 0.9 % IJ SOLN
INTRAMUSCULAR | Status: DC | PRN
  Administered 2014-11-27: 5 mL via SUBCUTANEOUS

## 2014-11-27 MED ORDER — BUPIVACAINE-EPINEPHRINE (PF) 0.25% -1:200000 IJ SOLN
INTRAMUSCULAR | Status: DC | PRN
  Administered 2014-11-27: 10 mL

## 2014-11-27 MED ORDER — OXYCODONE HCL 5 MG PO TABS: 5.0000 mg | ORAL_TABLET | Freq: Once | ORAL | Status: DC | PRN

## 2014-11-27 MED ORDER — MIDAZOLAM HCL 2 MG/2ML IJ SOLN
INTRAMUSCULAR | Status: AC
  Filled 2014-11-27: qty 2

## 2014-11-27 MED ORDER — FENTANYL CITRATE (PF) 100 MCG/2ML IJ SOLN
INTRAMUSCULAR | Status: AC
  Filled 2014-11-27: qty 2

## 2014-11-27 MED ORDER — ONDANSETRON HCL 4 MG/2ML IJ SOLN
INTRAMUSCULAR | Status: DC | PRN
  Administered 2014-11-27: 4 mg via INTRAVENOUS

## 2014-11-27 MED ORDER — 0.9 % SODIUM CHLORIDE (POUR BTL) OPTIME
TOPICAL | Status: DC | PRN
  Administered 2014-11-27: 300 mL

## 2014-11-27 MED ORDER — OXYCODONE HCL 5 MG/5ML PO SOLN: 5.0000 mg | Freq: Once | ORAL | Status: DC | PRN

## 2014-11-27 MED ORDER — BUPIVACAINE HCL (PF) 0.5 % IJ SOLN
INTRAMUSCULAR | Status: AC
  Filled 2014-11-27: qty 30

## 2014-11-27 MED ORDER — LACTATED RINGERS IV SOLN
INTRAVENOUS | Status: DC
  Administered 2014-11-27 (×2): via INTRAVENOUS

## 2014-11-27 MED ORDER — METHYLENE BLUE 1 % INJ SOLN
INTRAMUSCULAR | Status: AC
  Filled 2014-11-27: qty 10

## 2014-11-27 MED ORDER — MIDAZOLAM HCL 5 MG/5ML IJ SOLN
INTRAMUSCULAR | Status: DC | PRN
  Administered 2014-11-27: 2 mg via INTRAVENOUS

## 2014-11-27 MED ORDER — ACETAMINOPHEN 10 MG/ML IV SOLN
INTRAVENOUS | Status: AC
Start: 2014-11-27 — End: 2014-11-27
  Filled 2014-11-27: qty 100

## 2014-11-27 MED ORDER — CEFAZOLIN SODIUM-DEXTROSE 2-3 GM-% IV SOLR
INTRAVENOUS | Status: AC
  Filled 2014-11-27: qty 50

## 2014-11-27 MED ORDER — MEPERIDINE HCL 25 MG/ML IJ SOLN: 6.2500 mg | INTRAMUSCULAR | Status: DC | PRN

## 2014-11-27 MED ORDER — CEFAZOLIN SODIUM-DEXTROSE 2-3 GM-% IV SOLR
2.0000 g | INTRAVENOUS | Status: AC
  Administered 2014-11-27: 2 g via INTRAVENOUS

## 2014-11-27 MED ORDER — PROMETHAZINE HCL 25 MG/ML IJ SOLN: 6.2500 mg | INTRAMUSCULAR | Status: DC | PRN

## 2014-11-27 MED ORDER — LIDOCAINE HCL (CARDIAC) 20 MG/ML IV SOLN
INTRAVENOUS | Status: DC | PRN
Start: 2014-11-27 — End: 2014-11-27
  Administered 2014-11-27: 50 mg via INTRAVENOUS

## 2014-11-27 MED ORDER — FENTANYL CITRATE (PF) 100 MCG/2ML IJ SOLN
50.0000 ug | INTRAMUSCULAR | Status: DC | PRN
  Administered 2014-11-27: 100 ug via INTRAVENOUS

## 2014-11-27 MED ORDER — FENTANYL CITRATE (PF) 100 MCG/2ML IJ SOLN
INTRAMUSCULAR | Status: DC | PRN
  Administered 2014-11-27 (×3): 50 ug via INTRAVENOUS

## 2014-11-27 MED ORDER — ACETAMINOPHEN 10 MG/ML IV SOLN: 1000.0000 mg | Freq: Once | INTRAVENOUS | Status: DC

## 2014-11-27 MED ORDER — OXYCODONE-ACETAMINOPHEN 5-325 MG PO TABS: 1.0000 | ORAL_TABLET | ORAL | Status: DC | PRN

## 2014-11-27 MED ORDER — MIDAZOLAM HCL 2 MG/2ML IJ SOLN
1.0000 mg | INTRAMUSCULAR | Status: DC | PRN
Start: 2014-11-27 — End: 2014-11-27
  Administered 2014-11-27: 2 mg via INTRAVENOUS

## 2014-11-27 MED ORDER — CHLORHEXIDINE GLUCONATE 4 % EX LIQD: 1.0000 "application " | Freq: Once | CUTANEOUS | Status: DC

## 2014-11-27 MED ORDER — TECHNETIUM TC 99M SULFUR COLLOID FILTERED
1.0000 | Freq: Once | INTRAVENOUS | Status: AC | PRN
  Administered 2014-11-27: 1 via INTRADERMAL

## 2014-11-27 MED ORDER — FENTANYL CITRATE (PF) 100 MCG/2ML IJ SOLN
INTRAMUSCULAR | Status: AC
  Filled 2014-11-27: qty 6

## 2014-11-27 MED ORDER — DEXAMETHASONE SODIUM PHOSPHATE 4 MG/ML IJ SOLN
INTRAMUSCULAR | Status: DC | PRN
  Administered 2014-11-27: 10 mg via INTRAVENOUS

## 2014-11-27 MED ORDER — PROPOFOL 10 MG/ML IV BOLUS
INTRAVENOUS | Status: DC | PRN
  Administered 2014-11-27: 200 mg via INTRAVENOUS

## 2014-11-27 MED ORDER — SODIUM CHLORIDE 0.9 % IJ SOLN
INTRAMUSCULAR | Status: AC
  Filled 2014-11-27: qty 10

## 2014-11-27 MED ORDER — ACETAMINOPHEN 10 MG/ML IV SOLN
1000.0000 mg | Freq: Once | INTRAVENOUS | Status: AC | PRN
  Administered 2014-11-27: 1000 mg via INTRAVENOUS

## 2014-11-27 MED ORDER — GLYCOPYRROLATE 0.2 MG/ML IJ SOLN
0.2000 mg | Freq: Once | INTRAMUSCULAR | Status: AC | PRN
  Administered 2014-11-27: 0.2 mg via INTRAVENOUS

## 2014-11-27 SURGICAL SUPPLY — 53 items
APPLIER CLIP 9.375 MED OPEN (MISCELLANEOUS) ×3
BINDER BREAST LRG (GAUZE/BANDAGES/DRESSINGS) ×3 IMPLANT
BINDER BREAST MEDIUM (GAUZE/BANDAGES/DRESSINGS) IMPLANT
BINDER BREAST XLRG (GAUZE/BANDAGES/DRESSINGS) IMPLANT
BINDER BREAST XXLRG (GAUZE/BANDAGES/DRESSINGS) IMPLANT
BLADE SURG 15 STRL LF DISP TIS (BLADE) ×2 IMPLANT
BLADE SURG 15 STRL SS (BLADE) ×4
CANISTER SUCT 1200ML W/VALVE (MISCELLANEOUS) ×3 IMPLANT
CHLORAPREP W/TINT 26ML (MISCELLANEOUS) ×3 IMPLANT
CLIP APPLIE 9.375 MED OPEN (MISCELLANEOUS) ×1 IMPLANT
CONT SPECI 4OZ STER CLIK (MISCELLANEOUS) ×9 IMPLANT
COVER BACK TABLE 60X90IN (DRAPES) ×3 IMPLANT
COVER MAYO STAND STRL (DRAPES) ×3 IMPLANT
COVER PROBE W GEL 5X96 (DRAPES) ×3 IMPLANT
DECANTER SPIKE VIAL GLASS SM (MISCELLANEOUS) IMPLANT
DRAPE LAPAROSCOPIC ABDOMINAL (DRAPES) ×3 IMPLANT
DRAPE UTILITY XL STRL (DRAPES) ×3 IMPLANT
ELECT COATED BLADE 2.86 ST (ELECTRODE) ×3 IMPLANT
ELECT REM PT RETURN 9FT ADLT (ELECTROSURGICAL) ×3
ELECTRODE REM PT RTRN 9FT ADLT (ELECTROSURGICAL) ×1 IMPLANT
GLOVE BIOGEL PI IND STRL 6.5 (GLOVE) ×1 IMPLANT
GLOVE BIOGEL PI IND STRL 7.0 (GLOVE) ×1 IMPLANT
GLOVE BIOGEL PI IND STRL 7.5 (GLOVE) ×1 IMPLANT
GLOVE BIOGEL PI IND STRL 8 (GLOVE) ×1 IMPLANT
GLOVE BIOGEL PI INDICATOR 6.5 (GLOVE) ×2
GLOVE BIOGEL PI INDICATOR 7.0 (GLOVE) ×2
GLOVE BIOGEL PI INDICATOR 7.5 (GLOVE) ×2
GLOVE BIOGEL PI INDICATOR 8 (GLOVE) ×2
GLOVE ECLIPSE 8.0 STRL XLNG CF (GLOVE) ×3 IMPLANT
GLOVE EXAM NITRILE MD LF STRL (GLOVE) ×3 IMPLANT
GLOVE SURG SS PI 7.5 STRL IVOR (GLOVE) ×3 IMPLANT
GLOVE SURG SS PI 8.0 STRL IVOR (GLOVE) ×3 IMPLANT
GOWN STRL REUS W/ TWL LRG LVL3 (GOWN DISPOSABLE) ×3 IMPLANT
GOWN STRL REUS W/TWL LRG LVL3 (GOWN DISPOSABLE) ×6
HEMOSTAT SNOW SURGICEL 2X4 (HEMOSTASIS) IMPLANT
KIT MARKER MARGIN INK (KITS) ×3 IMPLANT
LIQUID BAND (GAUZE/BANDAGES/DRESSINGS) ×3 IMPLANT
NDL SAFETY ECLIPSE 18X1.5 (NEEDLE) ×1 IMPLANT
NEEDLE HYPO 18GX1.5 SHARP (NEEDLE) ×2
NEEDLE HYPO 25X1 1.5 SAFETY (NEEDLE) ×6 IMPLANT
NS IRRIG 1000ML POUR BTL (IV SOLUTION) ×3 IMPLANT
PACK BASIN DAY SURGERY FS (CUSTOM PROCEDURE TRAY) ×3 IMPLANT
PENCIL BUTTON HOLSTER BLD 10FT (ELECTRODE) ×3 IMPLANT
SLEEVE SCD COMPRESS KNEE MED (MISCELLANEOUS) ×3 IMPLANT
SPONGE LAP 4X18 X RAY DECT (DISPOSABLE) ×6 IMPLANT
SUT MNCRL AB 4-0 PS2 18 (SUTURE) ×6 IMPLANT
SUT VICRYL 3-0 CR8 SH (SUTURE) ×6 IMPLANT
SYR CONTROL 10ML LL (SYRINGE) ×6 IMPLANT
TOWEL OR 17X24 6PK STRL BLUE (TOWEL DISPOSABLE) ×3 IMPLANT
TOWEL OR NON WOVEN STRL DISP B (DISPOSABLE) ×3 IMPLANT
TUBE CONNECTING 20'X1/4 (TUBING) ×2
TUBE CONNECTING 20X1/4 (TUBING) ×4 IMPLANT
YANKAUER SUCT BULB TIP NO VENT (SUCTIONS) ×6 IMPLANT

## 2014-11-27 NOTE — Progress Notes (Signed)
Assisted Dr. Germeroth with right, pectoralis block. Side rails up, monitors on throughout procedure. See vital signs in flow sheet. Tolerated Procedure well. ?

## 2014-11-27 NOTE — Anesthesia Preprocedure Evaluation (Signed)
Anesthesia Evaluation  Patient identified by MRN, date of birth, ID band Patient awake    Reviewed: Allergy & Precautions, NPO status , Patient's Chart, lab work & pertinent test results  History of Anesthesia Complications (+) PONV and history of anesthetic complications  Airway Mallampati: II  TM Distance: >3 FB Neck ROM: Full    Dental no notable dental hx.    Pulmonary neg pulmonary ROS,  breath sounds clear to auscultation  Pulmonary exam normal       Cardiovascular negative cardio ROS  + Valvular Problems/Murmurs Rhythm:Regular Rate:Normal     Neuro/Psych  Headaches, negative psych ROS   GI/Hepatic negative GI ROS, Neg liver ROS,   Endo/Other  negative endocrine ROS  Renal/GU negative Renal ROS     Musculoskeletal  (+) Arthritis -,   Abdominal   Peds  Hematology negative hematology ROS (+)   Anesthesia Other Findings   Reproductive/Obstetrics negative OB ROS                             Anesthesia Physical Anesthesia Plan  ASA: II  Anesthesia Plan: General   Post-op Pain Management:    Induction: Intravenous  Airway Management Planned: LMA  Additional Equipment: None  Intra-op Plan:   Post-operative Plan: Extubation in OR  Informed Consent: I have reviewed the patients History and Physical, chart, labs and discussed the procedure including the risks, benefits and alternatives for the proposed anesthesia with the patient or authorized representative who has indicated his/her understanding and acceptance.   Dental advisory given  Plan Discussed with: CRNA  Anesthesia Plan Comments:         Anesthesia Quick Evaluation

## 2014-11-27 NOTE — Op Note (Signed)
Preoperative diagnosis: Stage II right breast cancer  Postoperative diagnosis: Same  Procedure: Right breast seed localized partial mastectomy and right axillary sentinel lymph node mapping with methylene blue dye  Surgeon: Erroll Luna M.D.  Anesthesia: LMA with regional block and 0.25% Sensorcaine local with epinephrine  EBL: Minimal  Specimen: Right breast tissue with clip mass and biopsy clip verified by radiology and for right axillary sentinel nodes to pathology  Drains: None  Indications for procedure: Patient is a 64 year old female with right breast cancer. She has a history of left breast cancer treated with breast conservation in the past. She was seen in the multidisciplinary breast clinic and elected for breast conservation. Risks, benefits and alternative therapies were discussed as outlined in my history and physical. She presents today for right breast partial mastectomy with sentinel lymph node mapping.The procedure has been discussed with the patient. Alternatives to surgery have been discussed with the patient.  Risks of surgery include bleeding,  Infection,  Seroma formation, death,  and the need for further surgery.   The patient understands and wishes to proceed.Sentinel lymph node mapping and dissection has been discussed with the patient.  Risk of bleeding,  Infection,  Seroma formation,  Additional procedures,,  Shoulder weakness ,  Shoulder stiffness,  Nerve and blood vessel injury and reaction to the mapping dyes have been discussed.  Alternatives to surgery have been discussed with the patient.  The patient agrees to proceed.  Description of procedure: Patient met in holding area and questions answered. Right breast was marked as correct side. Neoprobe used to verify location of the seed. She was taken back to the operating room and placed upon the OR table. After induction of LMA anesthesia, 4 mL of methylene blue dye were injected under sterile conditions into the  right breast. She had a previous breast reduction in the past and this was done the upper outer quadrant skin. She was then prepped and draped in a sterile fashion. Neoprobe was used and seed was located. Films were used to help localize. This seated migrated away from the mass about 3 cm. All tissue around the seed in and around the mass which was palpable were excised. I went ahead excised all margins given the fact that the seated migrated. These were oriented and passed off separately. Radiograph revealed both clips in the mass to be in the specimen. The cavity is irrigated and made hemostatic and clips are placed to mark. This is in the right upper outer quadrant. Neoprobe was used to same incision hotspot identified in the right axilla. Identified 1 blue and hot sentinel node in 3 other blue nodes. These were all sent to pathology. The remainder of the axilla show no evidence of blue dye normal radioactivity. Cavity was irrigated and closed with a deep layer of 3-0 Vicryl and 4-0 Monocryl for the skin. Liquid adhesive applied as well as a breast binder. All final counts are found to be correct. Patient awoke extubated taken to recovery in satisfactory condition.

## 2014-11-27 NOTE — Transfer of Care (Signed)
Immediate Anesthesia Transfer of Care Note  Patient: Natalie Valdez  Procedure(s) Performed: Procedure(s): BREAST LUMPECTOMY WITH RADIOACTIVE SEED AND SENTINEL LYMPH NODE MAPPING (Right)  Patient Location: PACU  Anesthesia Type:GA combined with regional for post-op pain  Level of Consciousness: sedated  Airway & Oxygen Therapy: Patient Spontanous Breathing and Patient connected to face mask oxygen  Post-op Assessment: Report given to RN and Post -op Vital signs reviewed and stable  Post vital signs: Reviewed and stable  Last Vitals:  Filed Vitals:   11/27/14 0730  BP:   Pulse: 90  Temp:   Resp: 20    Complications: No apparent anesthesia complications

## 2014-11-27 NOTE — Interval H&P Note (Signed)
History and Physical Interval Note:  11/27/2014 7:08 AM  Natalie Valdez  has presented today for surgery, with the diagnosis of Right Breast Cancer  The various methods of treatment have been discussed with the patient and family. After consideration of risks, benefits and other options for treatment, the patient has consented to  Procedure(s): BREAST LUMPECTOMY WITH RADIOACTIVE SEED AND SENTINEL LYMPH NODE MAPPING (Right) as a surgical intervention .  The patient's history has been reviewed, patient examined, no change in status, stable for surgery.  I have reviewed the patient's chart and labs.  Questions were answered to the patient's satisfaction.     Collins Dimaria A.

## 2014-11-27 NOTE — H&P (View-Only) (Signed)
Natalie Valdez 11/02/2014 9:31 AM Location: [facility] Patient #: [id number] DOB: 1950-11-12 Married / Language: [name] / Race: White Female History of Present Illness [name] A. Shantana Christon MD; 11/02/2014 10:59 AM) Patient words: breast cancer right  Pt presents at the request of [name] for right breast cancer. Pt has history of left breast cancer 2010 stage 1 treated with breast conservation and ALND due to no SLN identified. This was followed by radiation but she refused tamoxifen. Pt follow and released in 2014. She has had breast reduction since then. Pt notice small lump lateral right breast. Manmmogram and U/S showed i.6 cm mass core bx shown to be IDC/DCIS. She presents for this today. Pt has other wse been doing well without complaint.       CLINICAL DATA: 64 year old female with recently diagnosed invasive ductal carcinoma and ductal carcinoma in situ of the right breast. The patient has a history of left lumpectomy and radiation in 2010.  LABS: BUN and creatinine were obtained on site at [facility] at  [address]  Results: BUN 18 mg/dL, Creatinine 0.7 mg/dL.  EXAM: BILATERAL BREAST MRI WITH AND WITHOUT CONTRAST  TECHNIQUE: Multiplanar, multisequence MR images of both breasts were obtained prior to and following the intravenous administration of 17 ml of MultiHance.  THREE-DIMENSIONAL MR IMAGE RENDERING ON INDEPENDENT WORKSTATION:  Three-dimensional MR images were rendered by post-processing of the original MR data on an independent workstation. The three-dimensional MR images were interpreted, and findings are reported in the following complete MRI report for this study. Three dimensional images were evaluated at the independent DynaCad workstation  COMPARISON: Multiple prior mammograms, the most recent dated 10/17/2014. Ultrasound dated 10/17/2014. MRI dated 10/02/2008.  FINDINGS: Breast composition: b. Scattered  fibroglandular tissue.  Background parenchymal enhancement: There is marked background enhancement within the right breast which appears overall similar to prior study dated 10/02/2008. There is minimal background enhancement within the left breast, consistent with postradiation change.  Right breast: Within the slightly lower, outer right breast there is an irregular enhancing mass measuring 2.9 x 2.6 x 2.4 cm, corresponding to the known biopsy proven malignancy within the right breast. Biopsy marker artifact is noted adjacent to the superior and lateral aspect of the mass, series 4, image 81. There is marked background enhancement as described above.  Left breast: Changes of prior left lumpectomy and left axillary nodal dissection are noted. No area of suspicious enhancement is noted within the left breast.  Lymph nodes: No abnormal appearing lymph nodes. There are changes of prior left axillary nodal dissection.  IMPRESSION: Known biopsy proven malignancy within the right breast.  RECOMMENDATION: Treatment plan.  BI-RADS CATEGORY 6: Known biopsy-proven malignancy.   Electronically Signed By: Pamelia Hoit M.D. On: 10/24/2014 10:49       Patient: [name], VO Collected: 10/18/2014 Client: [facility] @ [address] Accession: [id number] Received: 10/18/2014 [name], MD DOB: Apr 30, 1951 Age: 64 Gender: F Reported: 10/19/2014 [address] Patient Ph: [phone] MRN #: [id number] [place], [place] [address] Client Acc#: Chart #: [id number] Phone: [phone] Fax: CC: [facility] CC CC [facility] REPORT OF SURGICAL PATHOLOGY ADDITIONAL INFORMATION: PROGNOSTIC INDICATORS - ACIS Results: IMMUNOHISTOCHEMICAL AND MORPHOMETRIC ANALYSIS BY THE AUTOMATED CELLULAR IMAGING SYSTEM (ACIS) Estrogen Receptor: 100%, POSITIVE, STRONG STAINING INTENSITY Progesterone Receptor: 12%, POSITIVE, STRONG STAINING INTENSITY Proliferation Marker Ki67:  51% REFERENCE RANGE ESTROGEN RECEPTOR NEGATIVE <1% POSITIVE =>1% PROGESTERONE RECEPTOR NEGATIVE <1% POSITIVE =>1% All controls stained appropriately Enid Cutter MD Pathologist,  Electronic Signature ( Signed 10/24/2014) CHROMOGENIC IN-SITU HYBRIDIZATION Results: HER-2/NEU BY CISH - NEGATIVE. RESULT RATIO OF HER2: CEP 17 SIGNALS 1.40 AVERAGE HER2 COPY NUMBER PER CELL 2.10 REFERENCE RANGE 1 of 3 FINAL for SHAILAH, [name] ([id number]) ADDITIONAL INFORMATION:(continued) NEGATIVE HER2/Chr17 Ratio <2.0 and Average HER2 copy number <4.0 EQUIVOCAL HER2/Chr17 Ratio <2.0 and Average HER2 copy number 4.0 and <6.0 POSITIVE HER2/Chr17 Ratio >=2.0 and/or Average HER2 copy number >=6.0 Enid Cutter MD Pathologist, Electronic Signature ( Signed 10/23/2014) FINAL DIAGNOSIS Diagnosis Breast, right, needle core biopsy - INVASIVE DUCTAL CARCINOMA. - DUCTAL CARCINOMA IN SITU. - LYMPHOVASCULAR INVASION IS IDENTIFIED. - SEE COMMENT. Microscopic Comment The carcinoma appears grade III. A breast prognostic profile will be performed and the results reported separately. The results were called to [facility] on 10/19/14. (JBK:gt, 10/19/14) Enid Cutter MD Pathologist, Electronic Signature (Case signed 10/19/2014) Specimen Gross and Clinical Information Specimen Comment In formalin 10:26; new mass-very suspicious Specimen(s) Obtained: Breast, right, needle core biopsy Gross Received in formalin are 4 cores of soft, tan-red tissue which measure 0.9 x 0.1 x 0.1 cm and up to 1.4 x 0.1 x 0.1 cm. Time in Formalin 10:26 am. Submitted in toto in 1 block(s) Stain(s) used in Diagnosis: The following stain(s) were used in diagnosing the case: ER-ACIS, PR-ACIS, CISH, KI-67-ACIS. The control(s) stained appropriately. Disclaimer Her2Neu by CISH (chromogenic in-situ hybridization) is performed at [facility], using the [place] pharmDx Kit (code number [id number]). This test is used to detect  the amplification of the Her2Neu gene in interphase nuclei from formalin fixed, paraffin embedded tissue and is reported using ASCO/CAP scoring criteria published in 2013. PR progesterone receptor (PgR 636), immunohistochemical stains are performed on formalin fixed, paraffin embedded tissue using a 3,3"-diaminobenzidine (DAB) chromogen and DAKO Autostainer System. The staining intensity of the nucleus is scored morphometrically using the Automated Cellular Imaging System (ACIS) and is reported as the percentage of tumor cell nuclei demonstrating specific nuclear staining. Estrogen receptor (SP1), immunohistochemical stains are performed on formalin fixed, paraffin embedded tissue using a 3,3"-diaminobenzidine (DAB) chromogen and DAKO Autostainer System. The staining intensity of the nucleus is scored morphometrically using the Automated Cellular Imaging System (ACIS) and is reported as the percentage of tumor cell nuclei demonstrating specific nuclear staining. 2 of 3 FINAL for AISSATA, WILMORE ([id number]) Disclaimer(continued)                            CLINICAL DATA: 65 year old female with recently diagnosed invasive ductal carcinoma and ductal carcinoma in situ of the right breast. The patient has a history of left lumpectomy and radiation in 2010.  LABS: BUN and creatinine were obtained on site at [facility] at  [address]  Results: BUN 18 mg/dL, Creatinine 0.7 mg/dL.  EXAM: BILATERAL BREAST MRI WITH AND WITHOUT CONTRAST  TECHNIQUE: Multiplanar, multisequence MR images of both breasts were obtained prior to and following the intravenous administration of 17 ml of MultiHance.  THREE-DIMENSIONAL MR IMAGE RENDERING ON INDEPENDENT WORKSTATION:  Three-dimensional MR images were rendered by post-processing of the original MR data on an independent workstation. The three-dimensional MR images were interpreted, and findings  are reported in the following complete MRI report for this study. Three dimensional images were evaluated at the independent DynaCad workstation  COMPARISON: Multiple prior mammograms, the most recent dated 10/17/2014. Ultrasound dated 10/17/2014. MRI dated 10/02/2008.  FINDINGS: Breast composition: b. Scattered fibroglandular tissue.  Background parenchymal enhancement: There is marked background enhancement  within the right breast which appears overall similar to prior study dated 10/02/2008. There is minimal background enhancement within the left breast, consistent with postradiation change.  Right breast: Within the slightly lower, outer right breast there is an irregular enhancing mass measuring 2.9 x 2.6 x 2.4 cm, corresponding to the known biopsy proven malignancy within the right breast. Biopsy marker artifact is noted adjacent to the superior and lateral aspect of the mass, series 4, image 81. There is marked background enhancement as described above.  Left breast: Changes of prior left lumpectomy and left axillary nodal dissection are noted. No area of suspicious enhancement is noted within the left breast.  Lymph nodes: No abnormal appearing lymph nodes. There are changes of prior left axillary nodal dissection.  IMPRESSION: Known biopsy proven malignancy within the right breast.  RECOMMENDATION: Treatment plan.  BI-RADS CATEGORY 6: Known biopsy-proven malignancy.   Electronically Signed By: Pamelia Hoit M.D. On: 10/24/2014 10:49         ADDITIONAL INFORMATION: PROGNOSTIC INDICATORS - ACIS Results: IMMUNOHISTOCHEMICAL AND MORPHOMETRIC ANALYSIS BY THE AUTOMATED CELLULAR IMAGING SYSTEM (ACIS) Estrogen Receptor: 100%, POSITIVE, STRONG STAINING INTENSITY Progesterone Receptor: 12%, POSITIVE, STRONG STAINING INTENSITY Proliferation Marker Ki67: 51% REFERENCE RANGE ESTROGEN RECEPTOR NEGATIVE <1% POSITIVE =>1% PROGESTERONE RECEPTOR NEGATIVE  <1% POSITIVE =>1% All controls stained appropriately Enid Cutter MD Pathologist, Electronic Signature ( Signed 10/24/2014) CHROMOGENIC IN-SITU HYBRIDIZATION Results: HER-2/NEU BY CISH - NEGATIVE. RESULT RATIO OF HER2: CEP 17 SIGNALS 1.40 AVERAGE HER2 COPY NUMBER PER CELL 2.10 REFERENCE RANGE 1 of 3 FINAL for SUZIE, VANDAM ([id number]) ADDITIONAL INFORMATION:(continued) NEGATIVE HER2/Chr17 Ratio <2.0 and Average HER2 copy number <4.0 EQUIVOCAL HER2/Chr17 Ratio <2.0 and Average HER2 copy number 4.0 and <6.0 POSITIVE HER2/Chr17 Ratio >=2.0 and/or Average HER2 copy number >=6.0 Enid Cutter MD Pathologist, Electronic Signature ( Signed 10/23/2014) FINAL DIAGNOSIS Diagnosis Breast, right, needle core biopsy - INVASIVE DUCTAL CARCINOMA. - DUCTAL CARCINOMA IN SITU. - LYMPHOVASCULAR INVASION IS IDENTIFIED. - SEE COMMENT. Microscopic Comment The carcinoma appears grade III. A breast prognostic profile will be performed and the results reported separately. The results were called to [facility] on 10/19/14. (JBK:gt, 10/19/14) Enid Cutter MD Pathologist, Electronic Signature (Case signed 10/19/2014) Specimen Gross and Clinical Information Specimen Comment In formalin 10:26; new mass-very suspicious Specimen(s) Obtained: Breast, right, needle core biopsy Gross Received in formalin are 4 cores of soft, tan-red tissue which measure 0.9 x 0.1 x 0.1 cm and up to 1.4 x 0.1 x 0.1 cm. Time in Formalin 10:26 am. Submitted in toto in 1 block(s) Stain(s) used in Diagnosis: The following stain(s) were used in diagnosing the case: ER-ACIS, PR-ACIS, CISH, KI-67-ACIS. The control(s) stained appropriately. Disclaimer Her2Neu by CISH (chromogenic in-situ hybridization) is performed at [facility], using the [place] pharmDx Kit (code number [id number]). This test is used to detect the amplification of the Her2Neu gene in interphase nuclei from formalin fixed, paraffin embedded  tissue and is reported using ASCO/CAP scoring criteria published in 2013. PR progesterone receptor (PgR 636), immunohistochemical stains are performed on formalin fixed, paraffin embedded tissue using a 3,3"-diaminobenzidine (DAB) chromogen and DAKO Autostainer System. The staining intensity of the nucleus is scored morphometrically using the Automated Cellular Imaging System (ACIS) and is reported as the percentage of tumor cell nuclei demonstrating specific nuclear staining. Estrogen receptor (SP1), immunohistochemical stains are performed on formalin fixed, paraffin embedded tissue using a 3,3"-diaminobenzidine (DAB) chromogen and DAKO Autostainer System. The staining intensity of the nucleus is scored morphometrically using the Automated Cellular Imaging System (  ACIS) and is reported as the percentage of tumor cell nuclei demonstrating specific nuclear staining. 2 of 3 FINAL for KIMBELY, WHITEAKER [phone]) Disclaimer(continued) Ki-67 (Mib-1), immunohistochemical stains are performed on formalin fixed, paraffin embedded tissue using a 3,3"-diaminobenzidine (DAB) chromogen and DAKO Autostainer System. The staining intensity of the nucleus is scored morphometrically using the Automated Cellular Imaging System (ACIS) and is reported as the percentage of tumor cell nuclei demonstrating specific nuclear staining. Report signed out from the following location(s) Technical Component was performed at [facility]. [address],[place] [address].[id number]., Technical component and interpretation was performed at [address], [place], [place] [address]. CLIA #: [id number], 3 of.  The patient is a 64 year old female   Other Problems Elbert Ewings, CMA; 11/02/2014 9:31 AM) Anxiety Disorder Arthritis Back Pain Breast Cancer Depression Gastroesophageal Reflux Disease General anesthesia - complications Lump In  Breast  Past Surgical History Elbert Ewings, CMA; 11/02/2014 9:31 AM) Breast Augmentation Bilateral. Breast Biopsy Bilateral. Breast Mass; Local Excision Left. Cesarean Section - 1 Hysterectomy (not due to cancer) - Partial  Diagnostic Studies History Elbert Ewings, CMA; 11/02/2014 9:31 AM) Colonoscopy never Mammogram within last year Pap Smear 1-5 years ago  Allergies Elbert Ewings, CMA; 11/02/2014 9:32 AM) No Known Drug Allergies 11/02/2014  Medication History Elbert Ewings, CMA; 11/02/2014 9:32 AM) TraMADol HCl (50MG Tablet, Oral) Active. Zolpidem Tartrate (10MG Tablet, Oral) Active. Celecoxib (200MG Capsule, Oral) Active. Citalopram Hydrobromide (20MG Tablet, Oral) Active. Restasis (0.05% Emulsion, Ophthalmic) Active. Vitamin D (Ergocalciferol) (50000UNIT Capsule, Oral) Active. Medications Reconciled  Social History Elbert Ewings, [place]; 11/02/2014 9:31 AM) Alcohol use Occasional alcohol use. Caffeine use Carbonated beverages. No drug use Tobacco use Never smoker.  Family History Elbert Ewings, [place]; 11/02/2014 9:31 AM) Arthritis Father. Breast Cancer Mother. Diabetes Mellitus Father. Migraine Headache Daughter.  Pregnancy / Birth History Elbert Ewings, CMA; 11/02/2014 9:31 AM) Age at menarche 82 years. Age of menopause 30-55 Gravida 4 Irregular periods Maternal age 42-25 Para 4     Review of Systems Elbert Ewings CMA; 11/02/2014 9:31 AM) General Present- Fatigue. Not Present- Appetite Loss, Chills, Fever, Night Sweats, Weight Gain and Weight Loss. Skin Present- New Lesions. Not Present- Change in Wart/Mole, Dryness, Hives, Jaundice, Non-Healing Wounds, Rash and Ulcer. HEENT Present- Wears glasses/contact lenses. Not Present- Earache, Hearing Loss, Hoarseness, Nose Bleed, Oral Ulcers, Ringing in the Ears, Seasonal Allergies, Sinus Pain, Sore Throat, Visual Disturbances and Yellow Eyes. Respiratory Not Present- Bloody sputum, Chronic Cough, Difficulty  Breathing, Snoring and Wheezing. Breast Present- Breast Mass. Not Present- Breast Pain, Nipple Discharge and Skin Changes. Cardiovascular Present- Palpitations. Not Present- Chest Pain, Difficulty Breathing Lying Down, Leg Cramps, Rapid Heart Rate, Shortness of Breath and Swelling of Extremities. Gastrointestinal Not Present- Abdominal Pain, Bloating, Bloody Stool, Change in Bowel Habits, Chronic diarrhea, Constipation, Difficulty Swallowing, Excessive gas, Gets full quickly at meals, Hemorrhoids, Indigestion, Nausea, Rectal Pain and Vomiting. Female Genitourinary Not Present- Frequency, Nocturia, Painful Urination, Pelvic Pain and Urgency. Musculoskeletal Present- Back Pain. Not Present- Joint Pain, Joint Stiffness, Muscle Pain, Muscle Weakness and Swelling of Extremities. Neurological Not Present- Decreased Memory, Fainting, Headaches, Numbness, Seizures, Tingling, Tremor, Trouble walking and Weakness. Psychiatric Not Present- Anxiety, Bipolar, Change in Sleep Pattern, Depression, Fearful and Frequent crying. Endocrine Not Present- Cold Intolerance, Excessive Hunger, Hair Changes, Heat Intolerance, Hot flashes and New Diabetes. Hematology Not Present- Easy Bruising, Excessive bleeding, Gland problems, HIV and Persistent Infections.  Vitals Elbert Ewings CMA; 11/02/2014 9:32 AM) 11/02/2014 9:31 AM Weight:  189 lb Height: 65in Body Surface Area: 1.98 m Body Mass Index: 31.45 kg/m Temp.: 98.50F(Oral)  Pulse: 95 (Regular)  Resp.: 18 (Unlabored)  BP: 128/70 (Sitting, Left Arm, Standard)     Physical Exam (Jessicah Croll A. Latavious Bitter MD; 11/02/2014 11:01 AM)  General Mental Status-Alert. General Appearance-Consistent with stated age. Hydration-Well hydrated. Voice-Normal.  Head and Neck Head-normocephalic, atraumatic with no lesions or palpable masses. Trachea-midline. Thyroid Gland Characteristics - normal size and consistency.  Eye Eyeball - Bilateral-Extraocular  movements intact. Sclera/Conjunctiva - Bilateral-No scleral icterus.  Chest and Lung Exam Chest and lung exam reveals -quiet, even and easy respiratory effort with no use of accessory muscles and on auscultation, normal breath sounds, no adventitious sounds and normal vocal resonance. Inspection Chest Wall - Normal. Back - normal.  Breast Note: breast reduction scars noted. right breast upper outer quadrant 2 cm mobile tumor. right axillla normal.. left breast lumpectomy site without mass. slightly smaller left breast.   Cardiovascular Cardiovascular examination reveals -normal heart sounds, regular rate and rhythm with no murmurs and normal pedal pulses bilaterally.  Neurologic Neurologic evaluation reveals -alert and oriented x 3 with no impairment of recent or remote memory. Mental Status-Normal.  Musculoskeletal Normal Exam - Left-Upper Extremity Strength Normal and Lower Extremity Strength Normal. Normal Exam - Right-Upper Extremity Strength Normal and Lower Extremity Strength Normal.  Lymphatic Head & Neck  General Head & Neck Lymphatics: Bilateral - Description - Normal. Axillary  General Axillary Region: Bilateral - Description - Normal. Tenderness - Non Tender.    Assessment & Plan (Eulalia Ellerman A. Hardy Harcum MD; 11/02/2014 11:03 AM)  BREAST CANCER, RIGHT (174.9  C50.911) Impression: 2.9 cm by MRI but large enogh breasts to have lumpectomy first with minimal risk of cosmetic deformity. Seeing oncology next week. Discussed right breast partial mastectomy and SLN mapping as well as mastectomy. pt desires breast conservation. I dont think neoadjuvant chemotherapy necessary given her breast size. Risk of lumpectomy include bleeding, infection, seroma, more surgery, use of seed/wire, wound care, cosmetic deformity and the need for other treatments, death , blood clots, death. Pt agrees to proceed. Risk of sentinel lymph node mapping include bleeding, infection,  lymphedema, shoulder pain. stiffness, dye allergy. cosmetic deformity , blood clots, death, need for more surgery. Pt agres to proceed.  Current Plans Referred to Genetic Counseling, for evaluation and follow up (Medical Genetics). Referred to Oncology, for evaluation and follow up (Oncology). Referred to Radiation Oncology, for evaluation and follow up (Radiation Oncology). Referred to Physical Therapy, for evaluation and follow up (Physical Therapy). Pt Education - [facility] Breast Biopsy HCI Pt Education - flb breast cancer surgery: discussed with patient and provided information. Pt Education - abc class: discussed with patient and provided information. HISTORY OF LEFT BREAST CANCER (V10.3  Z85.3) Impression: 2010 had breast conservation and ALND due to inability to map. refused tamoxifen back then

## 2014-11-27 NOTE — Anesthesia Procedure Notes (Signed)
Procedure Name: LMA Insertion Date/Time: 11/27/2014 7:39 AM Performed by: Toula Moos L Pre-anesthesia Checklist: Patient identified, Emergency Drugs available, Suction available, Patient being monitored and Timeout performed Patient Re-evaluated:Patient Re-evaluated prior to inductionOxygen Delivery Method: Circle System Utilized Preoxygenation: Pre-oxygenation with 100% oxygen Intubation Type: IV induction Ventilation: Mask ventilation without difficulty LMA: LMA inserted LMA Size: 4.0 Number of attempts: 1 Airway Equipment and Method: Bite block Placement Confirmation: positive ETCO2 Tube secured with: Tape Dental Injury: Teeth and Oropharynx as per pre-operative assessment

## 2014-11-27 NOTE — Discharge Instructions (Signed)
[facility] °Office Phone Number [phone] ° °BREAST BIOPSY/ PARTIAL MASTECTOMY: POST OP INSTRUCTIONS ° °Always review your discharge instruction sheet given to you by the facility where your surgery was performed. ° °IF YOU HAVE DISABILITY OR FAMILY LEAVE FORMS, YOU MUST BRING THEM TO THE OFFICE FOR PROCESSING.  DO NOT GIVE THEM TO YOUR DOCTOR. ° °1. A prescription for pain medication may be given to you upon discharge.  Take your pain medication as prescribed, if needed.  If narcotic pain medicine is not needed, then you may take acetaminophen (Tylenol) or ibuprofen (Advil) as needed. °2. Take your usually prescribed medications unless otherwise directed °3. If you need a refill on your pain medication, please contact your pharmacy.  They will contact our office to request authorization.  Prescriptions will not be filled after 5pm or on week-ends. °4. You should eat very light the first 24 hours after surgery, such as soup, crackers, pudding, etc.  Resume your normal diet the day after surgery. °5. Most patients will experience some swelling and bruising in the breast.  Ice packs and a good support bra will help.  Swelling and bruising can take several days to resolve.  °6. It is common to experience some constipation if taking pain medication after surgery.  Increasing fluid intake and taking a stool softener will usually help or prevent this problem from occurring.  A mild laxative (Milk of Magnesia or Miralax) should be taken according to package directions if there are no bowel movements after 48 hours. °7. Unless discharge instructions indicate otherwise, you may remove your bandages 24-48 hours after surgery, and you may shower at that time.  You may have steri-strips (small skin tapes) in place directly over the incision.  These strips should be left on the skin for 7-10 days.  If your surgeon used skin glue on the incision, you may shower in 24 hours.  The glue will flake off over the  next 2-3 weeks.  Any sutures or staples will be removed at the office during your follow-up visit. °8. ACTIVITIES:  You may resume regular daily activities (gradually increasing) beginning the next day.  Wearing a good support bra or sports bra minimizes pain and swelling.  You may have sexual intercourse when it is comfortable. °a. You may drive when you no longer are taking prescription pain medication, you can comfortably wear a seatbelt, and you can safely maneuver your car and apply brakes. °b. RETURN TO WORK:  ______________________________________________________________________________________ °9. You should see your doctor in the office for a follow-up appointment approximately two weeks after your surgery.  Your doctor’s nurse will typically make your follow-up appointment when she calls you with your pathology report.  Expect your pathology report 2-3 business days after your surgery.  You may call to check if you do not hear from us after three days. °10. OTHER INSTRUCTIONS: _______________________________________________________________________________________________ _____________________________________________________________________________________________________________________________________ °_____________________________________________________________________________________________________________________________________ °_____________________________________________________________________________________________________________________________________ ° °WHEN TO CALL YOUR DOCTOR: °1. Fever over 101.0 °2. Nausea and/or vomiting. °3. Extreme swelling or bruising. °4. Continued bleeding from incision. °5. Increased pain, redness, or drainage from the incision. ° °The clinic staff is available to answer your questions during regular business hours.  Please don’t hesitate to call and ask to speak to one of the nurses for clinical concerns.  If you have a medical emergency, go to the nearest  emergency room or call 911.  A surgeon from [facility] is always on call at the hospital. ° °For further questions, please visit [email or web address]  ° ° ° °  Post Anesthesia Home Care Instructions ° °Activity: °Get plenty of rest for the remainder of the day. A responsible adult should stay with you for 24 hours following the procedure.  °For the next 24 hours, DO NOT: °-Drive a car °-Operate machinery °-Drink alcoholic beverages °-Take any medication unless instructed by your physician °-Make any legal decisions or sign important papers. ° °Meals: °Start with liquid foods such as gelatin or soup. Progress to regular foods as tolerated. Avoid greasy, spicy, heavy foods. If nausea and/or vomiting occur, drink only clear liquids until the nausea and/or vomiting subsides. Call your physician if vomiting continues. ° °Special Instructions/Symptoms: °Your throat may feel dry or sore from the anesthesia or the breathing tube placed in your throat during surgery. If this causes discomfort, gargle with warm salt water. The discomfort should disappear within 24 hours. ° °If you had a scopolamine patch placed behind your ear for the management of post- operative nausea and/or vomiting: ° °1. The medication in the patch is effective for 72 hours, after which it should be removed.  Wrap patch in a tissue and discard in the trash. Wash hands thoroughly with soap and water. °2. You may remove the patch earlier than 72 hours if you experience unpleasant side effects which may include dry mouth, dizziness or visual disturbances. °3. Avoid touching the patch. Wash your hands with soap and water after contact with the patch. °  ° °

## 2014-11-27 NOTE — Anesthesia Postprocedure Evaluation (Signed)
Anesthesia Post Note  Patient: Natalie Valdez  Procedure(s) Performed: Procedure(s) (LRB): BREAST LUMPECTOMY WITH RADIOACTIVE SEED AND SENTINEL LYMPH NODE MAPPING (Right)  Anesthesia type: General  Patient location: PACU  Post pain: Pain level controlled  Post assessment: Post-op Vital signs reviewed  Last Vitals: BP 155/89 mmHg  Pulse 78  Temp(Src) 36.5 C (Oral)  Resp 20  SpO2 96%  LMP 11/07/2004  Post vital signs: Reviewed  Level of consciousness: sedated  Complications: No apparent anesthesia complications

## 2014-11-29 ENCOUNTER — Other Ambulatory Visit: Payer: Self-pay | Admitting: *Deleted

## 2014-11-29 ENCOUNTER — Telehealth: Payer: Self-pay | Admitting: *Deleted

## 2014-11-29 ENCOUNTER — Encounter (HOSPITAL_BASED_OUTPATIENT_CLINIC_OR_DEPARTMENT_OTHER): Payer: Self-pay | Admitting: Surgery

## 2014-11-29 NOTE — Telephone Encounter (Signed)
Spoke with patient and confirmed follow up appointment with Dr. Lindi Adie for 11/30/14 at 215pm.

## 2014-11-30 ENCOUNTER — Encounter: Payer: Self-pay | Admitting: *Deleted

## 2014-11-30 ENCOUNTER — Telehealth: Payer: Self-pay | Admitting: Hematology and Oncology

## 2014-11-30 ENCOUNTER — Ambulatory Visit (HOSPITAL_BASED_OUTPATIENT_CLINIC_OR_DEPARTMENT_OTHER): Payer: 59 | Admitting: Hematology and Oncology

## 2014-11-30 VITALS — BP 156/102 | HR 99 | Temp 97.6°F | Resp 18 | Ht 64.5 in | Wt 188.6 lb

## 2014-11-30 DIAGNOSIS — C50511 Malignant neoplasm of lower-outer quadrant of right female breast: Secondary | ICD-10-CM

## 2014-11-30 DIAGNOSIS — Z170 Estrogen receptor positive status [ER+]: Secondary | ICD-10-CM | POA: Diagnosis not present

## 2014-11-30 MED ORDER — ONDANSETRON HCL 8 MG PO TABS: 8.0000 mg | ORAL_TABLET | Freq: Two times a day (BID) | ORAL | Status: DC | PRN

## 2014-11-30 MED ORDER — LIDOCAINE-PRILOCAINE 2.5-2.5 % EX CREA: TOPICAL_CREAM | CUTANEOUS | Status: DC

## 2014-11-30 MED ORDER — LORAZEPAM 0.5 MG PO TABS: 0.5000 mg | ORAL_TABLET | Freq: Every day | ORAL | Status: DC

## 2014-11-30 MED ORDER — PROCHLORPERAZINE MALEATE 10 MG PO TABS: 10.0000 mg | ORAL_TABLET | Freq: Four times a day (QID) | ORAL | Status: DC | PRN

## 2014-11-30 MED ORDER — DEXAMETHASONE 4 MG PO TABS: ORAL_TABLET | ORAL | Status: DC

## 2014-11-30 NOTE — Telephone Encounter (Signed)
Appointments made and avs printed for patient,echo to linda to precert

## 2014-11-30 NOTE — Progress Notes (Signed)
Patient Care Team: Jonathon Jordan, MD as PCP - General (Family Medicine)  DIAGNOSIS: No matching staging information was found for the patient.  SUMMARY OF ONCOLOGIC HISTORY:   Breast cancer of lower-outer quadrant of right female breast   11/06/2008 Surgery Left breast lumpectomy: High-grade DCIS 2.8 cm, 18 lymph nodes negative, ER 98%, PR 81%, stage 0, patient refused antiestrogen therapy   10/18/2014 Initial Biopsy Right breast invasive ductal carcinoma with DCIS grade 3, lymphovascular invasion, ER 100%, PR 12%, HER-2 negative ratio 1.4, Ki-67 51%, T2 N0 M0 stage II a clinical stage   10/24/2014 Breast MRI Right breast irregular enhancing mass 2.9 x 2.6 x 2.4 cm, left breast evidence of prior lumpectomy and left axillary dissection   11/27/2014 Surgery Right lumpectomy: Invasive ductal carcinoma grade 3, 3.2 cm, with high-grade DCIS, LVI present,, 1/1 intramammary lymph node positive, 1 SLN micro-met, stage IIB, T2 N1a    CHIEF COMPLIANT: follow-up to discuss pathology from lumpectomy  INTERVAL HISTORY: Natalie Valdez is a 64 year old lady with above-mentioned history of left-sided breast cancer treated with lumpectomy and she is here today to discuss the final pathology report. She is recovering very well from the surgery. Denies any new problems or concerns. She is slightly sore from the surgical incision of the arm.  REVIEW OF SYSTEMS:   Constitutional: Denies fevers, chills or abnormal weight loss Eyes: Denies blurriness of vision Ears, nose, mouth, throat, and face: Denies mucositis or sore throat Respiratory: Denies cough, dyspnea or wheezes Cardiovascular: Denies palpitation, chest discomfort or lower extremity swelling Gastrointestinal:  Denies nausea, heartburn or change in bowel habits Skin: Denies abnormal skin rashes Lymphatics: Denies new lymphadenopathy or easy bruising Neurological:Denies numbness, tingling or new weaknesses Behavioral/Psych: Mood is stable, no new changes   Breast: soreness from recent surgery All other systems were reviewed with the patient and are negative.  I have reviewed the past medical history, past surgical history, social history and family history with the patient and they are unchanged from previous note.  ALLERGIES:  has No Known Allergies.  MEDICATIONS:  Current Outpatient Prescriptions  Medication Sig Dispense Refill  . celecoxib (CELEBREX) 200 MG capsule Take 200 mg by mouth 1 day or 1 dose.    . Cholecalciferol ([employer] VIT D 5000 HIGH-POTENCY PO) Take by mouth.    . citalopram (CELEXA) 20 MG tablet Take 20 mg by mouth every evening.   11  . oxyCODONE-acetaminophen (ROXICET) 5-325 MG per tablet Take 1-2 tablets by mouth every 4 (four) hours as needed. 30 tablet 0  . RESTASIS 0.05 % ophthalmic emulsion   9  . zolpidem (AMBIEN) 10 MG tablet   5   No current facility-administered medications for this visit.    PHYSICAL EXAMINATION: ECOG PERFORMANCE STATUS: 1 - Symptomatic but completely ambulatory  There were no vitals filed for this visit. There were no vitals filed for this visit.  GENERAL:alert, no distress and comfortable SKIN: skin color, texture, turgor are normal, no rashes or significant lesions EYES: normal, Conjunctiva are pink and non-injected, sclera clear OROPHARYNX:no exudate, no erythema and lips, buccal mucosa, and tongue normal  NECK: supple, thyroid normal size, non-tender, without nodularity LYMPH:  no palpable lymphadenopathy in the cervical, axillary or inguinal LUNGS: clear to auscultation and percussion with normal breathing effort HEART: regular rate & rhythm and no murmurs and no lower extremity edema ABDOMEN:abdomen soft, non-tender and normal bowel sounds Musculoskeletal:no cyanosis of digits and no clubbing  NEURO: alert & oriented x 3 with fluent speech,  no focal motor/sensory deficits  LABORATORY DATA:  I have reviewed the data as listed   Chemistry      Component Value Date/Time   NA  138 11/26/2014 1508   K 4.6 11/26/2014 1508   CL 103 11/26/2014 1508   CO2 25 11/26/2014 1508   BUN 17 11/26/2014 1508   CREATININE 0.83 11/26/2014 1508      Component Value Date/Time   CALCIUM 9.3 11/26/2014 1508   ALKPHOS 90 11/26/2014 1508   AST 17 11/26/2014 1508   ALT 15 11/26/2014 1508   BILITOT 0.3 11/26/2014 1508       Lab Results  Component Value Date   WBC 7.0 11/26/2014   HGB 13.5 11/27/2014   HCT 41.4 11/26/2014   MCV 87.5 11/26/2014   PLT 283 11/26/2014   NEUTROABS 4.3 11/26/2014    ASSESSMENT & PLAN:  Breast cancer of lower-outer quadrant of right female breast Right lumpectomy 11/27/2014: Invasive ductal carcinoma grade 3, 3.2 cm, with high-grade DCIS, LVI present,, 1/1 intramammary lymph node positive, 1 SLN micro-met, stage IIB, T2 N1a ER 100%, PR 12%, HER-2 negative, Ki-67 51%  Pathology review:Discussed with the patient, the details of the final pathology report and gave her a copy of this result. Because of patient has lymph node involvement, I recommended adjuvant systemic chemotherapy. Since there is node involvement, I did not recommend Oncotype DX testing. I discussed with her that her true margins were negative and that she does not need to undergo reresection of the margins.  Recommendation : 1. Adjuvant chemotherapy with dose dense Adriamycin and Cytoxan 4 followed by Abraxane weekly 12 2. Adjuvant radiation therapy followed by 3. Anti-estrogen therapy  Chemotherapy counseling:I have discussed the risks and benefits of chemotherapy including the risks of nausea/ vomiting, risk of infection from low WBC count, fatigue due to chemo or anemia, bruising or bleeding due to low platelets, mouth sores, loss/ change in taste and decreased appetite. Liver and kidney function will be monitored through out chemotherapy as abnormalities in liver and kidney function may be a side effect of treatment. Cardiac dysfunction due to Adriamycin was discussed in detail.  Risk of permanent bone marrow dysfunction and leukemia due to chemo were also discussed.  Plan: 1. Port placement 2. Echocardiogram 3. Chemotherapy class 4. PREVENT clinical trial counseling PREVENT trial: [id number] [phone]  Newly diagnosed breast cancer Stages 1-3 scheduled to receive anthracycline randomized to atorvastatin 40 mg or placebo once daily for 24 months along with 3 cardiac MRIs for 2 years paid for by the trial. I discussed the risks and benefits of atorvastatin including the risk of myopathy and elevation of liver function tests. I explained the randomization process and patient is interested in the trial. I provided her with literature to read and provided her with the contact with the clinical trials nurse to ask any questions regarding the trial.  Patient had BRCA1 and 2 tested in 2010 which were apparently negative.  Start chemotherapy 12/28/2014  No orders of the defined types were placed in this encounter.   The patient has a good understanding of the overall plan. she agrees with it. she will call with any problems that may develop before the next visit here.   Rulon Eisenmenger, MD

## 2014-11-30 NOTE — Assessment & Plan Note (Signed)
Right lumpectomy 11/27/2014: Invasive ductal carcinoma grade 3, 3.2 cm, with high-grade DCIS, LVI present,, 1/1 intramammary lymph node positive, 1 SLN micro-met, stage IIB, T2 N1a ER 100%, PR 12%, HER-2 negative, Ki-67 51%  Pathology review:Discussed with the patient, the details of the final pathology report and gave her a copy of this result. Because of patient has lymph node involvement, I recommended adjuvant systemic chemotherapy. Since there is node involvement, I did not recommend Oncotype DX testing.  Recommendation : 1. Adjuvant chemotherapy with dose dense Adriamycin and Cytoxan 4 followed by Abraxane weekly 12 2. Adjuvant radiation therapy followed by 3. Anti-estrogen therapy  Chemotherapy counseling:I have discussed the risks and benefits of chemotherapy including the risks of nausea/ vomiting, risk of infection from low WBC count, fatigue due to chemo or anemia, bruising or bleeding due to low platelets, mouth sores, loss/ change in taste and decreased appetite. Liver and kidney function will be monitored through out chemotherapy as abnormalities in liver and kidney function may be a side effect of treatment. Cardiac dysfunction due to Adriamycin was discussed in detail. Risk of permanent bone marrow dysfunction and leukemia due to chemo were also discussed.  Plan: 1. Port placement 2. Echocardiogram 3. Chemotherapy class 4. PREVENT clinical trial counseling PREVENT trial: [id number] [phone]  Newly diagnosed breast cancer Stages 1-3 scheduled to receive anthracycline randomized to atorvastatin 40 mg or placebo once daily for 24 months along with 3 cardiac MRIs or 2 years paid for by the trial. I discussed the risks and benefits of atorvastatin including the risk of myopathy and elevation of liver function tests. I explained the randomization process and patient is interested in the trial. I provided her with literature to read and provided her with the contact with the clinical trials  nurse to ask any questions regarding the trial.  Patient had BRCA1 and 2 tested in 2010 which were apparently negative.

## 2014-12-03 ENCOUNTER — Ambulatory Visit: Payer: Self-pay | Admitting: Surgery

## 2014-12-03 ENCOUNTER — Other Ambulatory Visit: Payer: Self-pay | Admitting: *Deleted

## 2014-12-03 MED ORDER — UNABLE TO FIND: 1.0000 | Status: DC | PRN

## 2014-12-06 ENCOUNTER — Ambulatory Visit: Payer: Self-pay | Admitting: Surgery

## 2014-12-06 NOTE — H&P (Signed)
Natalie Valdez 12/06/2014 3:10 PM Location: [facility] Patient #: [id number] DOB: 12/15/1950 Married / Language: English / Race: White Female History of Present Illness (Natalie Amstutz A. Samuell Knoble MD; 12/06/2014 3:31 PM) Patient words: pt here for follow up after right breast lumpectomy and SLN mapping. She will need post op chemotherapy and requies a port.     ADDITIONAL INFORMATION: 1. FLUORESCENCE IN-SITU HYBRIDIZATION Results: HER2 - NEGATIVE RATIO OF HER2/CEP17 SIGNALS 1.58 AVERAGE HER2 COPY NUMBER PER CELL 2.80 Reference Range: NEGATIVE HER2/CEP17 Ratio <2.0 and average HER2 copy number <4.0 EQUIVOCAL HER2/CEP17 Ratio <2.0 and average HER2 copy number 4.0 and <6.0 POSITIVE HER2/CEP17 Ratio >=2.0 or <2.0 and average HER2 copy number >=6.0 Natalie KISH MD Pathologist, Electronic Signature ( Signed 12/04/2014) FINAL DIAGNOSIS Diagnosis 1. Breast, lumpectomy, right - INVASIVE DUCTAL CARCINOMA, GRADE III/III, SPANNING 3.2 CM. - DUCTAL CARCINOMA IN SITU WITH CALCIFICATIONS, HIGH GRADE - LYMPHOVASCULAR INVASION IS IDENTIFIED. - INVASIVE CARCINOMA IS BROADLY PRESENT AT THE LATERAL MARGIN OF SPECIMEN #1. - METASTATIC CARCINOMA IN 1 OF 1 INTRAMAMMARY LYMPH NODE (1/1). - SEE ONCOLOGY TABLE BELOW. 2. Breast, excision, right anterior margin - BENIGN BREAST PARENCHYMA. - THERE IS NO EVIDENCE OF MALIGNANCY. - SEE COMMENT. 1 of 5 FINAL for Natalie Valdez, Natalie Valdez (Natalie Valdez) Diagnosis(continued) 3. Breast, excision, right posterior margin - BENIGN BREAST PARENCHYMA. - THERE IS NO EVIDENCE OF MALIGNANCY. - SEE COMMENT. 4. Breast, excision, right medial margin - BENIGN BREAST PARENCHYMA. - THERE IS NO EVIDENCE OF MALIGNANCY. - SEE COMMENT. 5. Breast, excision, right lateral margin - BENIGN BREAST PARENCHYMA. - THERE IS NO EVIDENCE OF MALIGNANCY. - SEE COMMENT. 6. Breast, excision, right superior margin - BENIGN BREAST PARENCHYMA. - THERE IS NO EVIDENCE OF MALIGNANCY. - SEE  COMMENT. 7. Breast, excision, right inferior margin - BENIGN BREAST PARENCHYMA. - THERE IS NO EVIDENCE OF MALIGNANCY. - SEE COMMENT. 8. Lymph node, sentinel, biopsy, right axilla - THERE IS NO EVIDENCE OF CARCINOMA IN 1 OF 1 LYMPH NODE (0/1). 9. Lymph node, sentinel, biopsy, right axilla - MICROMETASTATIC CARCINOMA IN ONE OF ONE LYMPH NODE (1MIC/1). Microscopic Comment 1. BREAST, INVASIVE TUMOR, WITH LYMPH NODES PRESENT Specimen, including laterality and lymph node sampling (sentinel, non-sentinel): Right breast and right axillary sentinel nodes Procedure: Lumpectomy, additional six margin resections and two sentinel lymph node resections Histologic type: Ductal Grade: III Tubule formation: 3 Nuclear pleomorphism: 2 Mitotic: 3 Tumor size (gross measurement): 3.2 cm Margins: Invasive, distance to closest margin: Greater than 0.2 cm to all margins In-situ, distance to closest margin: Greater than 0.2 cm to all margins Lymphovascular invasion: Present, diffuse Ductal carcinoma in situ: Present Grade: High grade Extensive intraductal component: Not identified Lobular neoplasia: Not identified. Tumor focality: Unifocal Treatment effect: N/A Extent of tumor: Confined to breast parenchyma.  The patient is a 64 year old female presenting for a post-operative visit. Medication History (Natalie Valdez, CMA; 12/06/2014 3:10 PM) Medications Reconciled    Vitals (Natalie Valdez CMA; 12/06/2014 3:10 PM) 12/06/2014 3:10 PM Weight: 190 lb Height: 65in Body Surface Area: 1.99 m Body Mass Index: 31.62 kg/m Temp.: 98.4F(Oral)  Pulse: 76 (Regular)  BP: 122/80 (Sitting, Left Arm, Standard)     Physical Exam (Natalie Valdez A. Natalie Kuriakose MD; 12/06/2014 3:28 PM)  Breast Note: rght breast incision CDI no redness     Assessment & Plan (Natalie Valdez A. Natalie Hegstrom MD; 12/06/2014 3:31 PM)  POST-OPERATIVE STATE (V45.89  Z98.89)  POOR VENOUS ACCESS (459.89  I87.8) Impression: pt needs a  port for chemotherapy. Risk of bleeding, infection, port   migration/ embolization collapse of lung, injury to heart vena cava and major arteries nerves and veins. alternatives discussed. She agrees to proceed.

## 2014-12-10 ENCOUNTER — Encounter: Payer: 59 | Admitting: *Deleted

## 2014-12-10 ENCOUNTER — Other Ambulatory Visit: Payer: 59

## 2014-12-10 ENCOUNTER — Other Ambulatory Visit: Payer: Self-pay | Admitting: Hematology and Oncology

## 2014-12-10 DIAGNOSIS — Z006 Encounter for examination for normal comparison and control in clinical research program: Secondary | ICD-10-CM

## 2014-12-11 ENCOUNTER — Other Ambulatory Visit: Payer: Self-pay

## 2014-12-11 ENCOUNTER — Ambulatory Visit (HOSPITAL_COMMUNITY): Payer: 59 | Attending: Cardiovascular Disease

## 2014-12-11 DIAGNOSIS — C50511 Malignant neoplasm of lower-outer quadrant of right female breast: Secondary | ICD-10-CM | POA: Diagnosis not present

## 2014-12-13 ENCOUNTER — Ambulatory Visit: Payer: 59

## 2014-12-13 ENCOUNTER — Other Ambulatory Visit: Payer: Self-pay | Admitting: *Deleted

## 2014-12-13 ENCOUNTER — Other Ambulatory Visit: Payer: 59

## 2014-12-13 DIAGNOSIS — C50511 Malignant neoplasm of lower-outer quadrant of right female breast: Secondary | ICD-10-CM

## 2014-12-13 LAB — RESEARCH LABS

## 2014-12-14 ENCOUNTER — Encounter: Payer: Self-pay | Admitting: *Deleted

## 2014-12-14 ENCOUNTER — Encounter (HOSPITAL_BASED_OUTPATIENT_CLINIC_OR_DEPARTMENT_OTHER): Payer: Self-pay | Admitting: *Deleted

## 2014-12-14 DIAGNOSIS — C50511 Malignant neoplasm of lower-outer quadrant of right female breast: Secondary | ICD-10-CM

## 2014-12-14 NOTE — Progress Notes (Signed)
12/14/2014 1205 [place] study Patient was successfully registered to the study.  Spoke with patient by phone and confirmed appointment with her on 12/20/14 to come in for baseline labs, nurse assessment, PROs, dispensing of study drug, and cardiac MRI.  The patient verbalized understanding and expressed appreciation for the call. Marcellus Scott, RN Clinical Research [phone]

## 2014-12-18 ENCOUNTER — Ambulatory Visit (HOSPITAL_COMMUNITY): Payer: 59

## 2014-12-18 ENCOUNTER — Ambulatory Visit (HOSPITAL_BASED_OUTPATIENT_CLINIC_OR_DEPARTMENT_OTHER): Admission: RE | Admit: 2014-12-18 | Payer: 59 | Source: Ambulatory Visit | Admitting: Surgery

## 2014-12-18 ENCOUNTER — Encounter (HOSPITAL_BASED_OUTPATIENT_CLINIC_OR_DEPARTMENT_OTHER)
Admission: RE | Disposition: A | Discharge status: Home / Self Care | Payer: Self-pay | Source: Ambulatory Visit | Attending: Surgery

## 2014-12-18 ENCOUNTER — Ambulatory Visit (HOSPITAL_BASED_OUTPATIENT_CLINIC_OR_DEPARTMENT_OTHER): Payer: 59 | Admitting: Anesthesiology

## 2014-12-18 ENCOUNTER — Ambulatory Visit (HOSPITAL_BASED_OUTPATIENT_CLINIC_OR_DEPARTMENT_OTHER)
Admission: RE | Admit: 2014-12-18 | Discharge: 2014-12-18 | Disposition: A | Discharge status: Home / Self Care | Payer: 59 | Source: Ambulatory Visit | Attending: Surgery | Admitting: Surgery

## 2014-12-18 ENCOUNTER — Encounter (HOSPITAL_BASED_OUTPATIENT_CLINIC_OR_DEPARTMENT_OTHER): Payer: Self-pay | Admitting: Anesthesiology

## 2014-12-18 ENCOUNTER — Encounter (HOSPITAL_BASED_OUTPATIENT_CLINIC_OR_DEPARTMENT_OTHER): Admission: RE | Payer: Self-pay | Source: Ambulatory Visit

## 2014-12-18 DIAGNOSIS — Z95828 Presence of other vascular implants and grafts: Secondary | ICD-10-CM

## 2014-12-18 DIAGNOSIS — I878 Other specified disorders of veins: Secondary | ICD-10-CM

## 2014-12-18 DIAGNOSIS — C50911 Malignant neoplasm of unspecified site of right female breast: Secondary | ICD-10-CM | POA: Diagnosis not present

## 2014-12-18 DIAGNOSIS — C779 Secondary and unspecified malignant neoplasm of lymph node, unspecified: Secondary | ICD-10-CM | POA: Insufficient documentation

## 2014-12-18 HISTORY — PX: PORTACATH PLACEMENT: SHX2246

## 2014-12-18 LAB — POCT HEMOGLOBIN-HEMACUE: HEMOGLOBIN: 14.6 g/dL (ref 12.0–15.0)

## 2014-12-18 SURGERY — INSERTION, TUNNELED CENTRAL VENOUS DEVICE, WITH PORT
Anesthesia: General

## 2014-12-18 SURGERY — INSERTION, TUNNELED CENTRAL VENOUS DEVICE, WITH PORT
Anesthesia: General | Site: Chest | Laterality: Right

## 2014-12-18 MED ORDER — FENTANYL CITRATE (PF) 100 MCG/2ML IJ SOLN
INTRAMUSCULAR | Status: AC
  Filled 2014-12-18: qty 2

## 2014-12-18 MED ORDER — CEFAZOLIN SODIUM-DEXTROSE 2-3 GM-% IV SOLR
INTRAVENOUS | Status: AC
  Filled 2014-12-18: qty 50

## 2014-12-18 MED ORDER — PROPOFOL 10 MG/ML IV BOLUS
INTRAVENOUS | Status: DC | PRN
  Administered 2014-12-18: 200 mg via INTRAVENOUS

## 2014-12-18 MED ORDER — MIDAZOLAM HCL 5 MG/5ML IJ SOLN
INTRAMUSCULAR | Status: DC | PRN
  Administered 2014-12-18 (×2): 1 mg via INTRAVENOUS

## 2014-12-18 MED ORDER — BUPIVACAINE-EPINEPHRINE 0.25% -1:200000 IJ SOLN
INTRAMUSCULAR | Status: DC | PRN
  Administered 2014-12-18: 10 mL

## 2014-12-18 MED ORDER — MIDAZOLAM HCL 2 MG/2ML IJ SOLN: 1.0000 mg | INTRAMUSCULAR | Status: DC | PRN

## 2014-12-18 MED ORDER — FENTANYL CITRATE (PF) 100 MCG/2ML IJ SOLN: 50.0000 ug | INTRAMUSCULAR | Status: DC | PRN

## 2014-12-18 MED ORDER — CHLORHEXIDINE GLUCONATE 4 % EX LIQD: 1.0000 "application " | Freq: Once | CUTANEOUS | Status: DC

## 2014-12-18 MED ORDER — KETOROLAC TROMETHAMINE 30 MG/ML IJ SOLN: 30.0000 mg | Freq: Once | INTRAMUSCULAR | Status: DC | PRN

## 2014-12-18 MED ORDER — DEXAMETHASONE SODIUM PHOSPHATE 4 MG/ML IJ SOLN
INTRAMUSCULAR | Status: DC | PRN
  Administered 2014-12-18: 10 mg via INTRAVENOUS

## 2014-12-18 MED ORDER — HEPARIN SOD (PORK) LOCK FLUSH 100 UNIT/ML IV SOLN
INTRAVENOUS | Status: AC
  Filled 2014-12-18: qty 5

## 2014-12-18 MED ORDER — LIDOCAINE HCL (CARDIAC) 20 MG/ML IV SOLN
INTRAVENOUS | Status: DC | PRN
  Administered 2014-12-18: 50 mg via INTRAVENOUS

## 2014-12-18 MED ORDER — FENTANYL CITRATE (PF) 100 MCG/2ML IJ SOLN: 25.0000 ug | INTRAMUSCULAR | Status: DC | PRN

## 2014-12-18 MED ORDER — OXYCODONE-ACETAMINOPHEN 5-325 MG PO TABS: 1.0000 | ORAL_TABLET | ORAL | Status: DC | PRN

## 2014-12-18 MED ORDER — CEFAZOLIN SODIUM-DEXTROSE 2-3 GM-% IV SOLR
2.0000 g | INTRAVENOUS | Status: AC
  Administered 2014-12-18: 2 g via INTRAVENOUS

## 2014-12-18 MED ORDER — BUPIVACAINE-EPINEPHRINE (PF) 0.25% -1:200000 IJ SOLN
INTRAMUSCULAR | Status: AC
  Filled 2014-12-18: qty 30

## 2014-12-18 MED ORDER — OXYCODONE HCL 5 MG PO TABS
ORAL_TABLET | ORAL | Status: AC
  Filled 2014-12-18: qty 1

## 2014-12-18 MED ORDER — GLYCOPYRROLATE 0.2 MG/ML IJ SOLN: 0.2000 mg | Freq: Once | INTRAMUSCULAR | Status: DC | PRN

## 2014-12-18 MED ORDER — MIDAZOLAM HCL 2 MG/2ML IJ SOLN
INTRAMUSCULAR | Status: AC
  Filled 2014-12-18: qty 2

## 2014-12-18 MED ORDER — HEPARIN (PORCINE) IN NACL 2-0.9 UNIT/ML-% IJ SOLN
INTRAMUSCULAR | Status: AC
  Filled 2014-12-18: qty 500

## 2014-12-18 MED ORDER — FENTANYL CITRATE (PF) 100 MCG/2ML IJ SOLN
INTRAMUSCULAR | Status: DC | PRN
  Administered 2014-12-18: 50 ug via INTRAVENOUS

## 2014-12-18 MED ORDER — OXYCODONE HCL 5 MG PO TABS
5.0000 mg | ORAL_TABLET | Freq: Once | ORAL | Status: AC | PRN
  Administered 2014-12-18: 5 mg via ORAL

## 2014-12-18 MED ORDER — PROMETHAZINE HCL 25 MG/ML IJ SOLN: 6.2500 mg | INTRAMUSCULAR | Status: DC | PRN

## 2014-12-18 MED ORDER — ONDANSETRON HCL 4 MG/2ML IJ SOLN
INTRAMUSCULAR | Status: DC | PRN
  Administered 2014-12-18: 4 mg via INTRAVENOUS

## 2014-12-18 MED ORDER — HEPARIN SOD (PORK) LOCK FLUSH 100 UNIT/ML IV SOLN
INTRAVENOUS | Status: DC | PRN
  Administered 2014-12-18: 500 [IU] via INTRAVENOUS

## 2014-12-18 MED ORDER — HEPARIN (PORCINE) IN NACL 2-0.9 UNIT/ML-% IJ SOLN
INTRAMUSCULAR | Status: DC | PRN
  Administered 2014-12-18: 1 via INTRAVENOUS

## 2014-12-18 MED ORDER — LACTATED RINGERS IV SOLN
INTRAVENOUS | Status: DC
  Administered 2014-12-18: 14:00:00 via INTRAVENOUS

## 2014-12-18 SURGICAL SUPPLY — 58 items
BAG DECANTER FOR FLEXI CONT (MISCELLANEOUS) ×3 IMPLANT
BENZOIN TINCTURE PRP APPL 2/3 (GAUZE/BANDAGES/DRESSINGS) IMPLANT
BLADE HEX COATED 2.75 (ELECTRODE) ×3 IMPLANT
BLADE SURG 11 STRL SS (BLADE) ×3 IMPLANT
BLADE SURG 15 STRL LF DISP TIS (BLADE) ×1 IMPLANT
BLADE SURG 15 STRL SS (BLADE) ×2
CANISTER SUCT 1200ML W/VALVE (MISCELLANEOUS) IMPLANT
CHLORAPREP W/TINT 26ML (MISCELLANEOUS) ×3 IMPLANT
CLOSURE WOUND 1/2 X4 (GAUZE/BANDAGES/DRESSINGS)
COVER BACK TABLE 60X90IN (DRAPES) ×3 IMPLANT
COVER MAYO STAND STRL (DRAPES) ×3 IMPLANT
COVER PROBE 5X48 (MISCELLANEOUS) ×2
DECANTER SPIKE VIAL GLASS SM (MISCELLANEOUS) IMPLANT
DRAPE C-ARM 42X72 X-RAY (DRAPES) ×3 IMPLANT
DRAPE LAPAROSCOPIC ABDOMINAL (DRAPES) ×3 IMPLANT
DRAPE UTILITY XL STRL (DRAPES) ×3 IMPLANT
DRSG TEGADERM 2-3/8X2-3/4 SM (GAUZE/BANDAGES/DRESSINGS) IMPLANT
ELECT REM PT RETURN 9FT ADLT (ELECTROSURGICAL) ×3
ELECTRODE REM PT RTRN 9FT ADLT (ELECTROSURGICAL) ×1 IMPLANT
GEL ULTRASOUND 8.5O AQUASONIC (MISCELLANEOUS) IMPLANT
GLOVE BIOGEL PI IND STRL 7.0 (GLOVE) ×1 IMPLANT
GLOVE BIOGEL PI IND STRL 8 (GLOVE) ×1 IMPLANT
GLOVE BIOGEL PI INDICATOR 7.0 (GLOVE) ×2
GLOVE BIOGEL PI INDICATOR 8 (GLOVE) ×2
GLOVE ECLIPSE 6.5 STRL STRAW (GLOVE) ×3 IMPLANT
GLOVE ECLIPSE 8.0 STRL XLNG CF (GLOVE) ×3 IMPLANT
GOWN STRL REUS W/ TWL LRG LVL3 (GOWN DISPOSABLE) ×2 IMPLANT
GOWN STRL REUS W/TWL LRG LVL3 (GOWN DISPOSABLE) ×4
IV KIT MINILOC 20X1 SAFETY (NEEDLE) IMPLANT
KIT CVR 48X5XPRB PLUP LF (MISCELLANEOUS) ×1 IMPLANT
KIT PORT POWER 8FR ISP CVUE (Catheter) ×3 IMPLANT
LIQUID BAND (GAUZE/BANDAGES/DRESSINGS) ×3 IMPLANT
NDL SAFETY ECLIPSE 18X1.5 (NEEDLE) IMPLANT
NEEDLE HYPO 18GX1.5 SHARP (NEEDLE)
NEEDLE HYPO 22GX1.5 SAFETY (NEEDLE) IMPLANT
NEEDLE HYPO 25X1 1.5 SAFETY (NEEDLE) ×3 IMPLANT
NEEDLE SPNL 22GX3.5 QUINCKE BK (NEEDLE) IMPLANT
PACK BASIN DAY SURGERY FS (CUSTOM PROCEDURE TRAY) ×3 IMPLANT
PENCIL BUTTON HOLSTER BLD 10FT (ELECTRODE) ×3 IMPLANT
SET SHEATH INTRODUCER 10FR (MISCELLANEOUS) IMPLANT
SHEATH COOK PEEL AWAY SET 9F (SHEATH) IMPLANT
SLEEVE SCD COMPRESS KNEE MED (MISCELLANEOUS) ×3 IMPLANT
SPONGE GAUZE 4X4 12PLY STER LF (GAUZE/BANDAGES/DRESSINGS) IMPLANT
SPONGE LAP 4X18 X RAY DECT (DISPOSABLE) IMPLANT
STRIP CLOSURE SKIN 1/2X4 (GAUZE/BANDAGES/DRESSINGS) IMPLANT
SUT MON AB 4-0 PC3 18 (SUTURE) ×3 IMPLANT
SUT PROLENE 2 0 CT2 30 (SUTURE) IMPLANT
SUT PROLENE 2 0 SH DA (SUTURE) ×3 IMPLANT
SUT SILK 2 0 TIES 17X18 (SUTURE)
SUT SILK 2-0 18XBRD TIE BLK (SUTURE) IMPLANT
SUT VICRYL 3-0 CR8 SH (SUTURE) ×3 IMPLANT
SYR 5ML LUER SLIP (SYRINGE) ×3 IMPLANT
SYR CONTROL 10ML LL (SYRINGE) ×3 IMPLANT
TOWEL OR 17X24 6PK STRL BLUE (TOWEL DISPOSABLE) ×3 IMPLANT
TOWEL OR NON WOVEN STRL DISP B (DISPOSABLE) IMPLANT
TUBE CONNECTING 20'X1/4 (TUBING)
TUBE CONNECTING 20X1/4 (TUBING) IMPLANT
YANKAUER SUCT BULB TIP NO VENT (SUCTIONS) IMPLANT

## 2014-12-18 NOTE — Interval H&P Note (Signed)
History and Physical Interval Note:  12/18/2014 2:36 PM  Natalie Valdez  has presented today for surgery, with the diagnosis of Poor Venous Access  The various methods of treatment have been discussed with the patient and family. After consideration of risks, benefits and other options for treatment, the patient has consented to  Procedure(s): INSERTION PORT-A-CATH WITH [place] AND FLOURO (N/A) as a surgical intervention .  The patient's history has been reviewed, patient examined, no change in status, stable for surgery.  I have reviewed the patient's chart and labs.  Questions were answered to the patient's satisfaction.   PT IN NEED OF PORT FOR CHEMOTHERAPY.  The procedure has been discussed with the patient.  Alternative therapies have been discussed with the patient.  Operative risks include bleeding,  Infection, COLLAPSE LUNG   Organ injury,  Nerve injury,  Blood vessel injury,  DVT,  Pulmonary embolism,  Death,  And possible reoperation.  Medical management risks include worsening of present situation.  The patient understands and agrees to proceed.  Dorotea Hand A.

## 2014-12-18 NOTE — Anesthesia Postprocedure Evaluation (Signed)
  Anesthesia Post-op Note  Patient: Natalie Valdez  Procedure(s) Performed: Procedure(s) (LRB): INSERTION PORT-A-CATH WITH [place] AND FLOURO (Right)  Patient Location: PACU  Anesthesia Type: General  Level of Consciousness: awake and alert   Airway and Oxygen Therapy: Patient Spontanous Breathing  Post-op Pain: mild  Post-op Assessment: Post-op Vital signs reviewed, Patient's Cardiovascular Status Stable, Respiratory Function Stable, Patent Airway and No signs of Nausea or vomiting  Last Vitals:  Filed Vitals:   12/18/14 1349  BP: 129/87  Pulse: 82  Temp: 37.1 C  Resp: 18    Post-op Vital Signs: stable   Complications: No apparent anesthesia complications

## 2014-12-18 NOTE — Transfer of Care (Signed)
Immediate Anesthesia Transfer of Care Note  Patient: Natalie Valdez  Procedure(s) Performed: Procedure(s): INSERTION PORT-A-CATH WITH [place] AND FLOURO (Right)  Patient Location: PACU  Anesthesia Type:General  Level of Consciousness: awake and patient cooperative  Airway & Oxygen Therapy: Patient Spontanous Breathing and Patient connected to face mask oxygen  Post-op Assessment: Report given to RN and Post -op Vital signs reviewed and stable  Post vital signs: Reviewed and stable  Last Vitals:  Filed Vitals:   12/18/14 1349  BP: 129/87  Pulse: 82  Temp: 37.1 C  Resp: 18    Complications: No apparent anesthesia complications

## 2014-12-18 NOTE — H&P (View-Only) (Signed)
Natalie Valdez 12/06/2014 3:10 PM Location: [facility] Patient #: [id number] DOB: 04/23/1951 Married / Language: [name] / Race: White Female History of Present Illness [name] A. Arthella Headings MD; 12/06/2014 3:31 PM) Patient words: pt here for follow up after right breast lumpectomy and SLN mapping. She will need post op chemotherapy and requies a port.     ADDITIONAL INFORMATION: 1. FLUORESCENCE IN-SITU HYBRIDIZATION Results: HER2 - NEGATIVE RATIO OF HER2/CEP17 SIGNALS 1.58 AVERAGE HER2 COPY NUMBER PER CELL 2.80 Reference Range: NEGATIVE HER2/CEP17 Ratio <2.0 and average HER2 copy number <4.0 EQUIVOCAL HER2/CEP17 Ratio <2.0 and average HER2 copy number 4.0 and <6.0 POSITIVE HER2/CEP17 Ratio >=2.0 or <2.0 and average HER2 copy number >=6.0 Enid Cutter MD Pathologist, Electronic Signature ( Signed 12/04/2014) FINAL DIAGNOSIS Diagnosis 1. Breast, lumpectomy, right - INVASIVE DUCTAL CARCINOMA, GRADE III/III, SPANNING 3.2 CM. - DUCTAL CARCINOMA IN SITU WITH CALCIFICATIONS, HIGH GRADE - LYMPHOVASCULAR INVASION IS IDENTIFIED. - INVASIVE CARCINOMA IS BROADLY PRESENT AT THE LATERAL MARGIN OF SPECIMEN #1. - METASTATIC CARCINOMA IN 1 OF 1 INTRAMAMMARY LYMPH NODE (1/1). - SEE ONCOLOGY TABLE BELOW. 2. Breast, excision, right anterior margin - BENIGN BREAST PARENCHYMA. - THERE IS NO EVIDENCE OF MALIGNANCY. - SEE COMMENT. 1 of 5 FINAL for Natalie, Valdez [phone]) Diagnosis(continued) 3. Breast, excision, right posterior margin - BENIGN BREAST PARENCHYMA. - THERE IS NO EVIDENCE OF MALIGNANCY. - SEE COMMENT. 4. Breast, excision, right medial margin - BENIGN BREAST PARENCHYMA. - THERE IS NO EVIDENCE OF MALIGNANCY. - SEE COMMENT. 5. Breast, excision, right lateral margin - BENIGN BREAST PARENCHYMA. - THERE IS NO EVIDENCE OF MALIGNANCY. - SEE COMMENT. 6. Breast, excision, right superior margin - BENIGN BREAST PARENCHYMA. - THERE IS NO EVIDENCE OF MALIGNANCY. - SEE  COMMENT. 7. Breast, excision, right inferior margin - BENIGN BREAST PARENCHYMA. - THERE IS NO EVIDENCE OF MALIGNANCY. - SEE COMMENT. 8. Lymph node, sentinel, biopsy, right axilla - THERE IS NO EVIDENCE OF CARCINOMA IN 1 OF 1 LYMPH NODE (0/1). 9. Lymph node, sentinel, biopsy, right axilla - MICROMETASTATIC CARCINOMA IN ONE OF ONE LYMPH NODE (1MIC/1). Microscopic Comment 1. BREAST, INVASIVE TUMOR, WITH LYMPH NODES PRESENT Specimen, including laterality and lymph node sampling (sentinel, non-sentinel): Right breast and right axillary sentinel nodes Procedure: Lumpectomy, additional six margin resections and two sentinel lymph node resections Histologic type: Ductal Grade: III Tubule formation: 3 Nuclear pleomorphism: 2 Mitotic: 3 Tumor size (gross measurement): 3.2 cm Margins: Invasive, distance to closest margin: Greater than 0.2 cm to all margins In-situ, distance to closest margin: Greater than 0.2 cm to all margins Lymphovascular invasion: Present, diffuse Ductal carcinoma in situ: Present Grade: High grade Extensive intraductal component: Not identified Lobular neoplasia: Not identified. Tumor focality: Unifocal Treatment effect: N/A Extent of tumor: Confined to breast parenchyma.  The patient is a 64 year old female presenting for a post-operative visit. Medication History Illene Regulus, CMA; 12/06/2014 3:10 PM) Medications Reconciled    Vitals (Alisha Spillers CMA; 12/06/2014 3:10 PM) 12/06/2014 3:10 PM Weight: 190 lb Height: 65in Body Surface Area: 1.99 m Body Mass Index: 31.62 kg/m Temp.: 98.3F(Oral)  Pulse: 76 (Regular)  BP: 122/80 (Sitting, Left Arm, Standard)     Physical Exam (Jeremy Ditullio A. Courteny Egler MD; 12/06/2014 3:28 PM)  Breast Note: rght breast incision CDI no redness     Assessment & Plan (Albana Saperstein A. Dione Petron MD; 12/06/2014 3:31 PM)  POST-OPERATIVE STATE (V45.89  Z98.89)  POOR VENOUS ACCESS (459.89  I87.8) Impression: pt needs a  port for chemotherapy. Risk of bleeding, infection, port  migration/ embolization collapse of lung, injury to heart vena cava and major arteries nerves and veins. alternatives discussed. She agrees to proceed.

## 2014-12-18 NOTE — Discharge Instructions (Signed)
Implanted Port Home Guide °An implanted port is a type of central line that is placed under the skin. Central lines are used to provide IV access when treatment or nutrition needs to be given through a person's veins. Implanted ports are used for long-term IV access. An implanted port may be placed because:  °· You need IV medicine that would be irritating to the small veins in your hands or arms.   °· You need long-term IV medicines, such as antibiotics.   °· You need IV nutrition for a long period.   °· You need frequent blood draws for lab tests.   °· You need dialysis.   °Implanted ports are usually placed in the chest area, but they can also be placed in the upper arm, the abdomen, or the leg. An implanted port has two main parts:  °· Reservoir. The reservoir is round and will appear as a small, raised area under your skin. The reservoir is the part where a needle is inserted to give medicines or draw blood.   °· Catheter. The catheter is a thin, flexible tube that extends from the reservoir. The catheter is placed into a large vein. Medicine that is inserted into the reservoir goes into the catheter and then into the vein.   °HOW WILL I CARE FOR MY INCISION SITE? °Do not get the incision site wet. Bathe or shower as directed by your health care provider.  °HOW IS MY PORT ACCESSED? °Special steps must be taken to access the port:  °· Before the port is accessed, a numbing cream can be placed on the skin. This helps numb the skin over the port site.   °· Your health care provider uses a sterile technique to access the port. °· Your health care provider must put on a mask and sterile gloves. °· The skin over your port is cleaned carefully with an antiseptic and allowed to dry. °· The port is gently pinched between sterile gloves, and a needle is inserted into the port. °· Only "non-coring" port needles should be used to access the port. Once the port is accessed, a blood return should be checked. This helps  ensure that the port is in the vein and is not clogged.   °· If your port needs to remain accessed for a constant infusion, a clear (transparent) bandage will be placed over the needle site. The bandage and needle will need to be changed every week, or as directed by your health care provider.   °· Keep the bandage covering the needle clean and dry. Do not get it wet. Follow your health care provider's instructions on how to take a shower or bath while the port is accessed.   °· If your port does not need to stay accessed, no bandage is needed over the port.   °WHAT IS FLUSHING? °Flushing helps keep the port from getting clogged. Follow your health care provider's instructions on how and when to flush the port. Ports are usually flushed with saline solution or a medicine called heparin. The need for flushing will depend on how the port is used.  °· If the port is used for intermittent medicines or blood draws, the port will need to be flushed:   °· After medicines have been given.   °· After blood has been drawn.   °· As part of routine maintenance.   °· If a constant infusion is running, the port may not need to be flushed.   °HOW LONG WILL MY PORT STAY IMPLANTED? °The port can stay in for as long as your health care   provider thinks it is needed. When it is time for the port to come out, surgery will be done to remove it. The procedure is similar to the one performed when the port was put in.  WHEN SHOULD I SEEK IMMEDIATE MEDICAL CARE? When you have an implanted port, you should seek immediate medical care if:   You notice a bad smell coming from the incision site.   You have swelling, redness, or drainage at the incision site.   You have more swelling or pain at the port site or the surrounding area.   You have a fever that is not controlled with medicine. Document Released: 07/13/2005 Document Revised: 05/03/2013 Document Reviewed: 03/20/2013 [facility] Patient Information 2015 [place], [place]. This  information is not intended to replace advice given to you by your health care provider. Make sure you discuss any questions you have with your health care provider.   Post Anesthesia Home Care Instructions  Activity: Get plenty of rest for the remainder of the day. A responsible adult should stay with you for 24 hours following the procedure.  For the next 24 hours, DO NOT: -Drive a car -Paediatric nurse -Drink alcoholic beverages -Take any medication unless instructed by your physician -Make any legal decisions or sign important papers.  Meals: Start with liquid foods such as gelatin or soup. Progress to regular foods as tolerated. Avoid greasy, spicy, heavy foods. If nausea and/or vomiting occur, drink only clear liquids until the nausea and/or vomiting subsides. Call your physician if vomiting continues.  Special Instructions/Symptoms: Your throat may feel dry or sore from the anesthesia or the breathing tube placed in your throat during surgery. If this causes discomfort, gargle with warm salt water. The discomfort should disappear within 24 hours.  If you had a scopolamine patch placed behind your ear for the management of post- operative nausea and/or vomiting:  1. The medication in the patch is effective for 72 hours, after which it should be removed.  Wrap patch in a tissue and discard in the trash. Wash hands thoroughly with soap and water. 2. You may remove the patch earlier than 72 hours if you experience unpleasant side effects which may include dry mouth, dizziness or visual disturbances. 3. Avoid touching the patch. Wash your hands with soap and water after contact with the patch.

## 2014-12-18 NOTE — Anesthesia Preprocedure Evaluation (Signed)
Anesthesia Evaluation  Patient identified by MRN, date of birth, ID band Patient awake    Reviewed: Allergy & Precautions, NPO status , Patient's Chart, lab work & pertinent test results  History of Anesthesia Complications (+) PONV  Airway Mallampati: II  TM Distance: >3 FB Neck ROM: Full    Dental no notable dental hx.    Pulmonary neg pulmonary ROS,  breath sounds clear to auscultation  Pulmonary exam normal       Cardiovascular negative cardio ROS Normal cardiovascular examRhythm:Regular Rate:Normal     Neuro/Psych negative neurological ROS  negative psych ROS   GI/Hepatic negative GI ROS, Neg liver ROS,   Endo/Other  negative endocrine ROS  Renal/GU negative Renal ROS  negative genitourinary   Musculoskeletal negative musculoskeletal ROS (+)   Abdominal   Peds negative pediatric ROS (+)  Hematology negative hematology ROS (+)   Anesthesia Other Findings   Reproductive/Obstetrics negative OB ROS                             Anesthesia Physical Anesthesia Plan  ASA: II  Anesthesia Plan: General   Post-op Pain Management:    Induction: Intravenous  Airway Management Planned: LMA  Additional Equipment:   Intra-op Plan:   Post-operative Plan: Extubation in OR  Informed Consent: I have reviewed the patients History and Physical, chart, labs and discussed the procedure including the risks, benefits and alternatives for the proposed anesthesia with the patient or authorized representative who has indicated his/her understanding and acceptance.   Dental advisory given  Plan Discussed with: CRNA and Surgeon  Anesthesia Plan Comments:         Anesthesia Quick Evaluation

## 2014-12-18 NOTE — Op Note (Signed)
Preoperative diagnosis: PAC needed  Postoperative diagnosis: Same  Procedure: Portacath Placement right internal jugular 8 F with ultrasound and fluoroscopy   Surgeon: Turner Daniels, MD, FACS  Anesthesia: General and 0.25 % marcaine with epinephrine  Clinical History and Indications: The patient is getting ready to begin chemotherapy for her cancer. She  needs a Port-A-Cath for venous access.The procedure has been discussed with the patient.  Alternative therapies have been discussed with the patient.  Operative risks include bleeding,  Infection,  Organ injury,  Nerve injury,  Blood vessel injury,   Collapse lung  DVT,  Pulmonary embolism,  Death,  And possible reoperation.  Medical management risks include worsening of present situation.   The patient understands and agrees to proceed.  Description of Procedure: I have seen the patient in the holding area and confirmed the plans for the procedure as noted above. I reviewed the risks and complications again and the patient has no further questions. She wishes to proceed.   The patient was then taken to the operating room. After satisfactory general  anesthesia had been obtained the upper chest and lower neck were prepped and draped as a sterile field. The timeout was done. Pt placed in [place].   The right internal jugular vein was visualized with ultrasound and entered.  The guidewire WAS  threaded into the superior vena cava right atrial area under fluoroscopic guidance. An incision was then made on the anterior chest wall and a subcutaneous pocket fashioned for the port reservoir.  The port tubing was then brought through a subcutaneous tunnel from the port site to the guidewire site. The dilator and peel-away sheath were then advanced over the guidewire while monitoring this with fluoroscopy. The guidewire and dilator were removed and the tubing threaded to approximately 21 cm. The peel-away sheath was then removed. The catheter  aspirated and flushed easily. Using fluoroscopy the tip was backed out into the superior vena cava right atrial junction area. It aspirated and flushed easily. The reservoir was attached and the locking mechanism engaged. That aspirated and flushed easily.  The reservoir was secured to the fascia with 2 sutures of 2-0 Prolene. A final check with fluoroscopy was done to make sure we had no kinks and good positioning of the tip of the catheter. Everything appeared to be okay. The catheter was aspirated, flushed with dilute heparin and then concentrated aqueous heparin.  The incision was then closed with interrupted 3-0 Vicryl, and 4-0 Monocryl subcuticular with Dermabond on the skin.  There were no operative complications. Estimated blood loss was minimal. All counts were correct. The patient tolerated the procedure well.  Turner Daniels, MD, FACS

## 2014-12-18 NOTE — Anesthesia Procedure Notes (Signed)
Procedure Name: LMA Insertion Date/Time: 12/18/2014 2:57 PM Performed by: Toula Moos L Pre-anesthesia Checklist: Patient identified, Emergency Drugs available, Suction available, Patient being monitored and Timeout performed Patient Re-evaluated:Patient Re-evaluated prior to inductionOxygen Delivery Method: Circle System Utilized Preoxygenation: Pre-oxygenation with 100% oxygen Intubation Type: IV induction Ventilation: Mask ventilation without difficulty LMA: LMA inserted LMA Size: 4.0 Number of attempts: 1 Airway Equipment and Method: Bite block Placement Confirmation: positive ETCO2 Tube secured with: Tape Dental Injury: Teeth and Oropharynx as per pre-operative assessment

## 2014-12-19 ENCOUNTER — Encounter: Payer: Self-pay | Admitting: *Deleted

## 2014-12-19 ENCOUNTER — Encounter (HOSPITAL_BASED_OUTPATIENT_CLINIC_OR_DEPARTMENT_OTHER): Payer: Self-pay | Admitting: Surgery

## 2014-12-19 DIAGNOSIS — C50511 Malignant neoplasm of lower-outer quadrant of right female breast: Secondary | ICD-10-CM

## 2014-12-20 ENCOUNTER — Other Ambulatory Visit: Payer: Self-pay | Admitting: Hematology and Oncology

## 2014-12-20 ENCOUNTER — Ambulatory Visit (HOSPITAL_COMMUNITY)
Admission: RE | Admit: 2014-12-20 | Discharge: 2014-12-20 | Disposition: A | Discharge status: Home / Self Care | Payer: 59 | Source: Ambulatory Visit | Attending: Hematology and Oncology | Admitting: Hematology and Oncology

## 2014-12-20 ENCOUNTER — Telehealth: Payer: Self-pay | Admitting: *Deleted

## 2014-12-20 ENCOUNTER — Other Ambulatory Visit: Payer: Self-pay | Admitting: *Deleted

## 2014-12-20 ENCOUNTER — Encounter: Payer: 59 | Admitting: *Deleted

## 2014-12-20 ENCOUNTER — Other Ambulatory Visit (HOSPITAL_BASED_OUTPATIENT_CLINIC_OR_DEPARTMENT_OTHER): Payer: 59

## 2014-12-20 DIAGNOSIS — C50511 Malignant neoplasm of lower-outer quadrant of right female breast: Secondary | ICD-10-CM

## 2014-12-20 DIAGNOSIS — Z006 Encounter for examination for normal comparison and control in clinical research program: Secondary | ICD-10-CM

## 2014-12-20 LAB — COMPREHENSIVE METABOLIC PANEL (CC13)
ALK PHOS: 91 U/L (ref 40–150)
ALT: 9 U/L (ref 0–55)
AST: 14 U/L (ref 5–34)
Albumin: 3.7 g/dL (ref 3.5–5.0)
Anion Gap: 10 mEq/L (ref 3–11)
BILIRUBIN TOTAL: 0.31 mg/dL (ref 0.20–1.20)
BUN: 14.9 mg/dL (ref 7.0–26.0)
CHLORIDE: 106 meq/L (ref 98–109)
CO2: 26 mEq/L (ref 22–29)
Calcium: 8.6 mg/dL (ref 8.4–10.4)
Creatinine: 0.8 mg/dL (ref 0.6–1.1)
EGFR: 85 mL/min/{1.73_m2} — AB (ref >90)
Glucose: 110 mg/dl (ref 70–140)
Potassium: 3.7 mEq/L (ref 3.5–5.1)
Sodium: 141 mEq/L (ref 136–145)
Total Protein: 6.9 g/dL (ref 6.4–8.3)

## 2014-12-20 LAB — CBC WITH DIFFERENTIAL/PLATELET
BASO%: 0.3 % (ref 0.0–2.0)
BASOS ABS: 0 10*3/uL (ref 0.0–0.1)
EOS ABS: 0.1 10*3/uL (ref 0.0–0.5)
EOS%: 1 % (ref 0.0–7.0)
HCT: 39.7 % (ref 34.8–46.6)
HGB: 12.9 g/dL (ref 11.6–15.9)
LYMPH#: 2.3 10*3/uL (ref 0.9–3.3)
LYMPH%: 33.3 % (ref 14.0–49.7)
MCH: 28.7 pg (ref 25.1–34.0)
MCHC: 32.5 g/dL (ref 31.5–36.0)
MCV: 88.4 fL (ref 79.5–101.0)
MONO#: 0.5 10*3/uL (ref 0.1–0.9)
MONO%: 7.8 % (ref 0.0–14.0)
NEUT#: 3.9 10*3/uL (ref 1.5–6.5)
NEUT%: 57.6 % (ref 38.4–76.8)
PLATELETS: 251 10*3/uL (ref 145–400)
RBC: 4.49 10*6/uL (ref 3.70–5.45)
RDW: 13.2 % (ref 11.2–14.5)
WBC: 6.8 10*3/uL (ref 3.9–10.3)

## 2014-12-20 LAB — RESEARCH LABS

## 2014-12-20 MED ORDER — INV-ATORVASTATIN/PLACEBO 40 MG TABS WAKE FOREST WF 98213: 1.0000 | ORAL_TABLET | Freq: Every day | ORAL | Status: DC

## 2014-12-20 NOTE — Progress Notes (Signed)
12/20/2014  1245 PREVENT study Patient came to [facility] for research labs, vital signs, and nurse visit. TAS and waist measurement was obtained by this RN. Questionnaires were administered to patient per B. Puhala, CRA. Patient was given study drug after it was checked and logged in by pharmacy.The patient understands to begin study drug on 12/26/14 and will take the pill (2 days before chemotherapy start date) at the same time every day. The patient was given medication calendars and instructed to mark off the days on calendar after it is taken and to write down if a dose is missed. The patient was instructed to report side effects which were again reviewed.  The patient verbalized understanding and was without any questions regarding study drug. She had her study labs drawn today and will then go to Radiology for Cardiac MRI. This RN thanked the patient for her time and commitment to the study. She understands her current schedule and will begin chemotherapy on 12/28/14. She had no questions after today's visit. Marcellus Scott, RN Clinical Research

## 2014-12-20 NOTE — Telephone Encounter (Signed)
Received telephone advice record from [facility], sent to scan. patien does not have rx for Ativan, she stated that she spoke with [facility] today and she is going to call in the prescription.

## 2014-12-27 NOTE — Assessment & Plan Note (Signed)
Right lumpectomy 11/27/2014: Invasive ductal carcinoma grade 3, 3.2 cm, with high-grade DCIS, LVI present,, 1/1 intramammary lymph node positive, 1 SLN micro-met, stage IIB, T2 N1a ER 100%, PR 12%, HER-2 negative, Ki-67 51% Patient is enrolled on PREVENT clinical trial Lipitor versus placebo  Treatment plan: 1. Adjuvant chemotherapy with dose dense Adriamycin and Cytoxan 4 followed by Abraxane weekly 12 2. Adjuvant radiation therapy followed by 3. Anti-estrogen therapylan:  Current treatment: Cycle 1 day 1 dose dense Adriamycin and Cytoxan Echocardiogram 12/11/2014: EF 60-65% Port has been placed Chemotherapy class is being done Patient understands completely the risks and benefits of chemotherapy She understands how to take antiemetics  Return to clinic in 1 week for toxicity check with my nurse practitioner

## 2014-12-28 ENCOUNTER — Encounter: Payer: Self-pay | Admitting: *Deleted

## 2014-12-28 ENCOUNTER — Ambulatory Visit (HOSPITAL_BASED_OUTPATIENT_CLINIC_OR_DEPARTMENT_OTHER): Payer: 59

## 2014-12-28 ENCOUNTER — Ambulatory Visit (HOSPITAL_BASED_OUTPATIENT_CLINIC_OR_DEPARTMENT_OTHER): Payer: 59 | Admitting: Hematology and Oncology

## 2014-12-28 ENCOUNTER — Encounter: Payer: Self-pay | Admitting: Hematology and Oncology

## 2014-12-28 ENCOUNTER — Telehealth: Payer: Self-pay | Admitting: Nurse Practitioner

## 2014-12-28 ENCOUNTER — Ambulatory Visit: Payer: 59

## 2014-12-28 ENCOUNTER — Other Ambulatory Visit (HOSPITAL_BASED_OUTPATIENT_CLINIC_OR_DEPARTMENT_OTHER): Payer: 59

## 2014-12-28 VITALS — BP 117/82 | HR 93 | Temp 98.7°F | Wt 187.3 lb

## 2014-12-28 DIAGNOSIS — C50511 Malignant neoplasm of lower-outer quadrant of right female breast: Secondary | ICD-10-CM

## 2014-12-28 DIAGNOSIS — Z006 Encounter for examination for normal comparison and control in clinical research program: Secondary | ICD-10-CM

## 2014-12-28 DIAGNOSIS — Z5189 Encounter for other specified aftercare: Secondary | ICD-10-CM

## 2014-12-28 DIAGNOSIS — Z95828 Presence of other vascular implants and grafts: Secondary | ICD-10-CM

## 2014-12-28 DIAGNOSIS — Z170 Estrogen receptor positive status [ER+]: Secondary | ICD-10-CM

## 2014-12-28 DIAGNOSIS — Z5111 Encounter for antineoplastic chemotherapy: Secondary | ICD-10-CM | POA: Diagnosis not present

## 2014-12-28 LAB — COMPREHENSIVE METABOLIC PANEL (CC13)
ALBUMIN: 3.6 g/dL (ref 3.5–5.0)
ALT: 9 U/L (ref 0–55)
ANION GAP: 8 meq/L (ref 3–11)
AST: 12 U/L (ref 5–34)
Alkaline Phosphatase: 93 U/L (ref 40–150)
BUN: 17.7 mg/dL (ref 7.0–26.0)
CALCIUM: 8.8 mg/dL (ref 8.4–10.4)
CO2: 25 mEq/L (ref 22–29)
Chloride: 108 mEq/L (ref 98–109)
Creatinine: 0.8 mg/dL (ref 0.6–1.1)
EGFR: 81 mL/min/{1.73_m2} — ABNORMAL LOW (ref >90)
Glucose: 118 mg/dl (ref 70–140)
Potassium: 3.8 mEq/L (ref 3.5–5.1)
Sodium: 141 mEq/L (ref 136–145)
Total Bilirubin: 0.48 mg/dL (ref 0.20–1.20)
Total Protein: 6.9 g/dL (ref 6.4–8.3)

## 2014-12-28 LAB — CBC WITH DIFFERENTIAL/PLATELET
BASO%: 0.2 % (ref 0.0–2.0)
BASOS ABS: 0 10*3/uL (ref 0.0–0.1)
EOS ABS: 0.1 10*3/uL (ref 0.0–0.5)
EOS%: 1.6 % (ref 0.0–7.0)
HCT: 39.4 % (ref 34.8–46.6)
HGB: 13.1 g/dL (ref 11.6–15.9)
LYMPH#: 1.5 10*3/uL (ref 0.9–3.3)
LYMPH%: 28.5 % (ref 14.0–49.7)
MCH: 29.1 pg (ref 25.1–34.0)
MCHC: 33.2 g/dL (ref 31.5–36.0)
MCV: 87.6 fL (ref 79.5–101.0)
MONO#: 0.4 10*3/uL (ref 0.1–0.9)
MONO%: 8.5 % (ref 0.0–14.0)
NEUT#: 3.2 10*3/uL (ref 1.5–6.5)
NEUT%: 61.2 % (ref 38.4–76.8)
Platelets: 223 10*3/uL (ref 145–400)
RBC: 4.5 10*6/uL (ref 3.70–5.45)
RDW: 13.2 % (ref 11.2–14.5)
WBC: 5.2 10*3/uL (ref 3.9–10.3)

## 2014-12-28 MED ORDER — SODIUM CHLORIDE 0.9 % IV SOLN
Freq: Once | INTRAVENOUS | Status: AC
  Administered 2014-12-28: 11:00:00 via INTRAVENOUS
  Filled 2014-12-28: qty 5

## 2014-12-28 MED ORDER — DOXORUBICIN HCL CHEMO IV INJECTION 2 MG/ML
60.0000 mg/m2 | Freq: Once | INTRAVENOUS | Status: AC
  Administered 2014-12-28: 118 mg via INTRAVENOUS
  Filled 2014-12-28: qty 59

## 2014-12-28 MED ORDER — CYCLOPHOSPHAMIDE CHEMO INJECTION 1 GM
600.0000 mg/m2 | Freq: Once | INTRAMUSCULAR | Status: AC
  Administered 2014-12-28: 1180 mg via INTRAVENOUS
  Filled 2014-12-28: qty 59

## 2014-12-28 MED ORDER — PEGFILGRASTIM 6 MG/0.6ML ~~LOC~~ PSKT
6.0000 mg | PREFILLED_SYRINGE | Freq: Once | SUBCUTANEOUS | Status: AC
  Administered 2014-12-28: 6 mg via SUBCUTANEOUS
  Filled 2014-12-28: qty 0.6

## 2014-12-28 MED ORDER — PALONOSETRON HCL INJECTION 0.25 MG/5ML
0.2500 mg | Freq: Once | INTRAVENOUS | Status: AC
  Administered 2014-12-28: 0.25 mg via INTRAVENOUS

## 2014-12-28 MED ORDER — HEPARIN SOD (PORK) LOCK FLUSH 100 UNIT/ML IV SOLN
500.0000 [IU] | Freq: Once | INTRAVENOUS | Status: DC | PRN
  Filled 2014-12-28: qty 5

## 2014-12-28 MED ORDER — SODIUM CHLORIDE 0.9 % IJ SOLN
10.0000 mL | INTRAMUSCULAR | Status: DC | PRN
  Administered 2014-12-28: 10 mL via INTRAVENOUS
  Filled 2014-12-28: qty 10

## 2014-12-28 MED ORDER — SODIUM CHLORIDE 0.9 % IV SOLN
Freq: Once | INTRAVENOUS | Status: AC
  Administered 2014-12-28: 11:00:00 via INTRAVENOUS

## 2014-12-28 MED ORDER — SODIUM CHLORIDE 0.9 % IJ SOLN
10.0000 mL | INTRAMUSCULAR | Status: DC | PRN
  Filled 2014-12-28: qty 10

## 2014-12-28 NOTE — Patient Instructions (Signed)
[facility] Discharge Instructions for Patients Receiving Chemotherapy  Today you received the following chemotherapy agents Adriamycin and Cytoxan.  To help prevent nausea and vomiting after your treatment, we encourage you to take your nausea medication as prescribed.   If you develop nausea and vomiting that is not controlled by your nausea medication, call the clinic.   BELOW ARE SYMPTOMS THAT SHOULD BE REPORTED IMMEDIATELY:  *FEVER GREATER THAN 100.5 F  *CHILLS WITH OR WITHOUT FEVER  NAUSEA AND VOMITING THAT IS NOT CONTROLLED WITH YOUR NAUSEA MEDICATION  *UNUSUAL SHORTNESS OF BREATH  *UNUSUAL BRUISING OR BLEEDING  TENDERNESS IN MOUTH AND THROAT WITH OR WITHOUT PRESENCE OF ULCERS  *URINARY PROBLEMS  *BOWEL PROBLEMS  UNUSUAL RASH Items with * indicate a potential emergency and should be followed up as soon as possible.  Feel free to call the clinic you have any questions or concerns. The clinic phone number is [phone].  Please show the CHEMO ALERT CARD at check-in to the Emergency Department and triage nurse.   

## 2014-12-28 NOTE — Progress Notes (Signed)
Met with pt prior to 1st chemotherapy treatment. Pt with a little anxiety of the "unknown". Discussed anti-nausea medications. Dr. Lindi Adie will give her a "road map" and discuss anti-nausea medications in further depth. Encourage pt to call with questions.

## 2014-12-28 NOTE — Progress Notes (Signed)
Enrolled pt in the [employer] First [employer] program.  Faxed signed form and activated card today.  °

## 2014-12-28 NOTE — Telephone Encounter (Signed)
Appointments made and avs pritned for patient °

## 2014-12-28 NOTE — Patient Instructions (Signed)

## 2014-12-28 NOTE — Progress Notes (Signed)
12/28/2014 1030 PREVENT study The patient is in the clinic for her first chemotherapy treatment. This RN met with the patient and confirmed she started taking her study pills on 12/26/14 at 6pm and again on 12/27/14 at 6pm. The patient denied complaints and confirmed no side effects from study drug. The patient denied any questions regarding the PREVENT study.  This RN thanked the patient for her time and commitment to the PREVENT study. Marcellus Scott, RN Clinical Research Nurse

## 2014-12-28 NOTE — Progress Notes (Signed)
Patient Care Team: Jonathon Jordan, MD as PCP - General (Family Medicine)  DIAGNOSIS: No matching staging information was found for the patient.  SUMMARY OF ONCOLOGIC HISTORY:   Breast cancer of lower-outer quadrant of right female breast   11/06/2008 Surgery Left breast lumpectomy: High-grade DCIS 2.8 cm, 18 lymph nodes negative, ER 98%, PR 81%, stage 0, patient refused antiestrogen therapy   10/18/2014 Initial Biopsy Right breast invasive ductal carcinoma with DCIS grade 3, lymphovascular invasion, ER 100%, PR 12%, HER-2 negative ratio 1.4, Ki-67 51%, T2 N0 M0 stage II a clinical stage   10/24/2014 Breast MRI Right breast irregular enhancing mass 2.9 x 2.6 x 2.4 cm, left breast evidence of prior lumpectomy and left axillary dissection   11/27/2014 Surgery Right lumpectomy: Invasive ductal carcinoma grade 3, 3.2 cm, with high-grade DCIS, LVI present,, 1/1 intramammary lymph node positive, 1 SLN micro-met, stage IIB, T2 N1a   12/28/2014 -  Chemotherapy Adjuvant dose dense Adriamycin and Cytoxan X 4 followed by Abraxane weekly 12    CHIEF COMPLIANT: Cycle 1 day 1 dose dense Adriamycin and Cytoxan  INTERVAL HISTORY: Natalie Valdez is a 64 year old with above-mentioned history of right breast cancer with positive lymph node, patient is here to start adjuvant chemotherapy. She had chemotherapy class echocardiogram or placement and is ready to start treatment.  REVIEW OF SYSTEMS:   Constitutional: Denies fevers, chills or abnormal weight loss Eyes: Denies blurriness of vision Ears, nose, mouth, throat, and face: Denies mucositis or sore throat Respiratory: Denies cough, dyspnea or wheezes Cardiovascular: Denies palpitation, chest discomfort or lower extremity swelling Gastrointestinal:  Denies nausea, heartburn or change in bowel habits Skin: Denies abnormal skin rashes Lymphatics: Denies new lymphadenopathy or easy bruising Neurological:Denies numbness, tingling or new  weaknesses Behavioral/Psych: Mood is stable, no new changes  Breast: Denies any pain from recent surgery All other systems were reviewed with the patient and are negative.  I have reviewed the past medical history, past surgical history, social history and family history with the patient and they are unchanged from previous note.  ALLERGIES:  has No Known Allergies.  MEDICATIONS:  Current Outpatient Prescriptions  Medication Sig Dispense Refill  . Atorvastatin Calcium (INVESTIGATIONAL ATORVASTATIN/PLACEBO) 40 MG tablet [facility] [id number] Take 1 tablet by mouth daily. Take 2 doses (these doses must be 12 hours apart) prior to first chemotherapy treatment. Then take 1 tablet daily by mouth with or without food. 180 tablet 0  . celecoxib (CELEBREX) 200 MG capsule Take 200 mg by mouth 1 day or 1 dose.    . Cholecalciferol ([employer] VIT D 5000 HIGH-POTENCY PO) Take by mouth.    . citalopram (CELEXA) 20 MG tablet Take 20 mg by mouth every evening.   11  . dexamethasone (DECADRON) 4 MG tablet Take 1 tablets by mouth once a day on the day after chemotherapy and then take 1 tablets two times a day for 2 days. Take with food. 30 tablet 1  . lidocaine-prilocaine (EMLA) cream Apply to affected area once 30 g 3  . LORazepam (ATIVAN) 0.5 MG tablet Take 1 tablet (0.5 mg total) by mouth at bedtime. 30 tablet 0  . ondansetron (ZOFRAN) 8 MG tablet Take 1 tablet (8 mg total) by mouth 2 (two) times daily as needed. Start on the third day after chemotherapy. 30 tablet 1  . oxyCODONE-acetaminophen (ROXICET) 5-325 MG per tablet Take 1-2 tablets by mouth every 4 (four) hours as needed. 30 tablet 0  . oxyCODONE-acetaminophen (ROXICET) 5-325 MG per  tablet Take 1 tablet by mouth every 4 (four) hours as needed. 30 tablet 0  . prochlorperazine (COMPAZINE) 10 MG tablet Take 1 tablet (10 mg total) by mouth every 6 (six) hours as needed (Nausea or vomiting). 30 tablet 1  . RESTASIS 0.05 % ophthalmic emulsion   9  . UNABLE TO  FIND 1 each by Other route continuous as needed. 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 1 each 1  . zolpidem (AMBIEN) 10 MG tablet   5   No current facility-administered medications for this visit.    PHYSICAL EXAMINATION: ECOG PERFORMANCE STATUS: 1 - Symptomatic but completely ambulatory  There were no vitals filed for this visit. There were no vitals filed for this visit.  GENERAL:alert, no distress and comfortable SKIN: skin color, texture, turgor are normal, no rashes or significant lesions EYES: normal, Conjunctiva are pink and non-injected, sclera clear OROPHARYNX:no exudate, no erythema and lips, buccal mucosa, and tongue normal  NECK: supple, thyroid normal size, non-tender, without nodularity LYMPH:  no palpable lymphadenopathy in the cervical, axillary or inguinal LUNGS: clear to auscultation and percussion with normal breathing effort HEART: regular rate & rhythm and no murmurs and no lower extremity edema ABDOMEN:abdomen soft, non-tender and normal bowel sounds Musculoskeletal:no cyanosis of digits and no clubbing  NEURO: alert & oriented x 3 with fluent speech, no focal motor/sensory deficits   LABORATORY DATA:  I have reviewed the data as listed   Chemistry      Component Value Date/Time   NA 141 12/20/2014 1321   NA 138 11/26/2014 1508   K 3.7 12/20/2014 1321   K 4.6 11/26/2014 1508   CL 103 11/26/2014 1508   CO2 26 12/20/2014 1321   CO2 25 11/26/2014 1508   BUN 14.9 12/20/2014 1321   BUN 17 11/26/2014 1508   CREATININE 0.8 12/20/2014 1321   CREATININE 0.83 11/26/2014 1508      Component Value Date/Time   CALCIUM 8.6 12/20/2014 1321   CALCIUM 9.3 11/26/2014 1508   ALKPHOS 91 12/20/2014 1321   ALKPHOS 90 11/26/2014 1508   AST 14 12/20/2014 1321   AST 17 11/26/2014 1508   ALT 9 12/20/2014 1321   ALT 15 11/26/2014 1508   BILITOT 0.31 12/20/2014 1321   BILITOT 0.3 11/26/2014  1508       Lab Results  Component Value Date   WBC 6.8 12/20/2014   HGB 12.9 12/20/2014   HCT 39.7 12/20/2014   MCV 88.4 12/20/2014   PLT 251 12/20/2014   NEUTROABS 3.9 12/20/2014     RADIOGRAPHIC STUDIES: I have personally reviewed the radiology reports and agreed with their findings. Echocardiogram EF 60-65%  ASSESSMENT & PLAN:  Breast cancer of lower-outer quadrant of right female breast Right lumpectomy 11/27/2014: Invasive ductal carcinoma grade 3, 3.2 cm, with high-grade DCIS, LVI present,, 1/1 intramammary lymph node positive, 1 SLN micro-met, stage IIB, T2 N1a ER 100%, PR 12%, HER-2 negative, Ki-67 51% Patient is enrolled on PREVENT clinical trial Lipitor versus placebo  Treatment plan: 1. Adjuvant chemotherapy with dose dense Adriamycin and Cytoxan 4 followed by Abraxane weekly 12 2. Adjuvant radiation therapy followed by 3. Anti-estrogen therapy  Current treatment: Adjuvant chemotherapy: Cycle 1 day 1 dose dense Adriamycin and Cytoxan Echocardiogram 12/11/2014: EF 60-65% Port has been placed Chemotherapy class is being done Patient understands completely the risks and benefits of chemotherapy She understands how to take antiemetics  Return to clinic in 1 week  for toxicity check with my nurse practitioner  No orders of the defined types were placed in this encounter.   The patient has a good understanding of the overall plan. she agrees with it. she will call with any problems that may develop before the next visit here.   Rulon Eisenmenger, MD

## 2015-01-01 ENCOUNTER — Telehealth: Payer: Self-pay | Admitting: *Deleted

## 2015-01-01 NOTE — Telephone Encounter (Signed)
TC to patient after receiving vm @ 9:41 am. Spoke with patient. She states she had chemo on Friday, 12/28/14 (adriamycin and cytoxan). She is experiencing diarrhea for the last 2 days-every time she uses the bathroom (approx. 4-5 x day). Denies fever, nausea, vomiting.  Has not taken any imodium. Advised to take Imodium as directed on package, to increase fluids to at least 64 oz per day. Avoid caffeine drinks.  If no better in 24 hours , advised pt to call back. She has appt for labs and with office visit with Gentry Fitz, NP on Friday 01/04/15. Pt voiced understanding.

## 2015-01-04 ENCOUNTER — Ambulatory Visit (HOSPITAL_BASED_OUTPATIENT_CLINIC_OR_DEPARTMENT_OTHER): Payer: 59

## 2015-01-04 ENCOUNTER — Ambulatory Visit (HOSPITAL_BASED_OUTPATIENT_CLINIC_OR_DEPARTMENT_OTHER): Payer: 59 | Admitting: Nurse Practitioner

## 2015-01-04 ENCOUNTER — Other Ambulatory Visit: Payer: 59

## 2015-01-04 ENCOUNTER — Other Ambulatory Visit (HOSPITAL_BASED_OUTPATIENT_CLINIC_OR_DEPARTMENT_OTHER): Payer: 59

## 2015-01-04 ENCOUNTER — Encounter: Payer: Self-pay | Admitting: Nurse Practitioner

## 2015-01-04 ENCOUNTER — Telehealth: Payer: Self-pay | Admitting: Hematology and Oncology

## 2015-01-04 VITALS — BP 109/63 | HR 94 | Temp 99.7°F | Resp 18 | Ht 64.0 in | Wt 187.6 lb

## 2015-01-04 DIAGNOSIS — Z95828 Presence of other vascular implants and grafts: Secondary | ICD-10-CM

## 2015-01-04 DIAGNOSIS — C50511 Malignant neoplasm of lower-outer quadrant of right female breast: Secondary | ICD-10-CM

## 2015-01-04 DIAGNOSIS — Z006 Encounter for examination for normal comparison and control in clinical research program: Secondary | ICD-10-CM | POA: Diagnosis not present

## 2015-01-04 DIAGNOSIS — R12 Heartburn: Secondary | ICD-10-CM

## 2015-01-04 DIAGNOSIS — D701 Agranulocytosis secondary to cancer chemotherapy: Secondary | ICD-10-CM | POA: Diagnosis not present

## 2015-01-04 DIAGNOSIS — T451X5A Adverse effect of antineoplastic and immunosuppressive drugs, initial encounter: Secondary | ICD-10-CM

## 2015-01-04 LAB — COMPREHENSIVE METABOLIC PANEL (CC13)
ALBUMIN: 2.9 g/dL — AB (ref 3.5–5.0)
ALK PHOS: 87 U/L (ref 40–150)
ALT: 12 U/L (ref 0–55)
AST: 9 U/L (ref 5–34)
Anion Gap: 11 mEq/L (ref 3–11)
BILIRUBIN TOTAL: 0.43 mg/dL (ref 0.20–1.20)
BUN: 12.2 mg/dL (ref 7.0–26.0)
CHLORIDE: 104 meq/L (ref 98–109)
CO2: 21 meq/L — AB (ref 22–29)
Calcium: 8.4 mg/dL (ref 8.4–10.4)
Creatinine: 0.6 mg/dL (ref 0.6–1.1)
GLUCOSE: 112 mg/dL (ref 70–140)
POTASSIUM: 3.8 meq/L (ref 3.5–5.1)
SODIUM: 135 meq/L — AB (ref 136–145)
TOTAL PROTEIN: 6.2 g/dL — AB (ref 6.4–8.3)

## 2015-01-04 LAB — CBC WITH DIFFERENTIAL/PLATELET
BASO%: 1.7 % (ref 0.0–2.0)
Basophils Absolute: 0 10*3/uL (ref 0.0–0.1)
EOS%: 13.3 % — AB (ref 0.0–7.0)
Eosinophils Absolute: 0.1 10*3/uL (ref 0.0–0.5)
HCT: 32.4 % — ABNORMAL LOW (ref 34.8–46.6)
HEMOGLOBIN: 10.8 g/dL — AB (ref 11.6–15.9)
LYMPH%: 78.3 % — ABNORMAL HIGH (ref 14.0–49.7)
MCH: 28.5 pg (ref 25.1–34.0)
MCHC: 33.3 g/dL (ref 31.5–36.0)
MCV: 85.5 fL (ref 79.5–101.0)
MONO#: 0 10*3/uL — ABNORMAL LOW (ref 0.1–0.9)
MONO%: 6.7 % (ref 0.0–14.0)
NEUT%: 0 % — AB (ref 38.4–76.8)
NEUTROS ABS: 0 10*3/uL — AB (ref 1.5–6.5)
Platelets: 90 10*3/uL — ABNORMAL LOW (ref 145–400)
RBC: 3.79 10*6/uL (ref 3.70–5.45)
RDW: 12.8 % (ref 11.2–14.5)
WBC: 0.6 10*3/uL — AB (ref 3.9–10.3)
lymph#: 0.5 10*3/uL — ABNORMAL LOW (ref 0.9–3.3)

## 2015-01-04 MED ORDER — OMEPRAZOLE 40 MG PO CPDR: 40.0000 mg | DELAYED_RELEASE_CAPSULE | Freq: Every day | ORAL | Status: DC

## 2015-01-04 MED ORDER — SODIUM CHLORIDE 0.9 % IJ SOLN
10.0000 mL | INTRAMUSCULAR | Status: DC | PRN
  Administered 2015-01-04: 10 mL via INTRAVENOUS
  Filled 2015-01-04: qty 10

## 2015-01-04 MED ORDER — HEPARIN SOD (PORK) LOCK FLUSH 100 UNIT/ML IV SOLN
500.0000 [IU] | Freq: Once | INTRAVENOUS | Status: DC
  Filled 2015-01-04: qty 5

## 2015-01-04 MED ORDER — CIPROFLOXACIN HCL 500 MG PO TABS: 500.0000 mg | ORAL_TABLET | Freq: Two times a day (BID) | ORAL | Status: DC

## 2015-01-04 NOTE — Progress Notes (Signed)
Patient Care Team: Jonathon Jordan, MD as PCP - General (Family Medicine)  DIAGNOSIS: No matching staging information was found for the patient.  SUMMARY OF ONCOLOGIC HISTORY:   Breast cancer of lower-outer quadrant of right female breast   11/06/2008 Surgery Left breast lumpectomy: High-grade DCIS 2.8 cm, 18 lymph nodes negative, ER 98%, PR 81%, stage 0, patient refused antiestrogen therapy   10/18/2014 Initial Biopsy Right breast invasive ductal carcinoma with DCIS grade 3, lymphovascular invasion, ER 100%, PR 12%, HER-2 negative ratio 1.4, Ki-67 51%, T2 N0 M0 stage II a clinical stage   10/24/2014 Breast MRI Right breast irregular enhancing mass 2.9 x 2.6 x 2.4 cm, left breast evidence of prior lumpectomy and left axillary dissection   11/27/2014 Surgery Right lumpectomy: Invasive ductal carcinoma grade 3, 3.2 cm, with high-grade DCIS, LVI present,, 1/1 intramammary lymph node positive, 1 SLN micro-met, stage IIB, T2 N1a   12/28/2014 -  Chemotherapy Adjuvant dose dense Adriamycin and Cytoxan X 4 followed by Abraxane weekly 12    CHIEF COMPLIANT: Cycle 1 day 1 dose dense Adriamycin and Cytoxan  INTERVAL HISTORY: Natalie Valdez is a 64 year old with above-mentioned history of right breast cancer with positive lymph node, patient is here to start adjuvant chemotherapy. She had chemotherapy class echocardiogram or placement and is ready to start treatment.  REVIEW OF SYSTEMS:   Constitutional: Denies fevers, chills or abnormal weight loss Eyes: Denies blurriness of vision Ears, nose, mouth, throat, and face: Denies mucositis or sore throat Respiratory: Denies cough, dyspnea or wheezes Cardiovascular: Denies palpitation, chest discomfort or lower extremity swelling Gastrointestinal:  Denies nausea, heartburn or change in bowel habits Skin: Denies abnormal skin rashes Lymphatics: Denies new lymphadenopathy or easy bruising Neurological:Denies numbness, tingling or new  weaknesses Behavioral/Psych: Mood is stable, no new changes  Breast: Denies any pain from recent surgery All other systems were reviewed with the patient and are negative.  I have reviewed the past medical history, past surgical history, social history and family history with the patient and they are unchanged from previous note.  ALLERGIES:  has No Known Allergies.  MEDICATIONS:  Current Outpatient Prescriptions  Medication Sig Dispense Refill  . Atorvastatin Calcium (INVESTIGATIONAL ATORVASTATIN/PLACEBO) 40 MG tablet [facility] [id number] Take 1 tablet by mouth daily. Take 2 doses (these doses must be 12 hours apart) prior to first chemotherapy treatment. Then take 1 tablet daily by mouth with or without food. 180 tablet 0  . celecoxib (CELEBREX) 200 MG capsule Take 200 mg by mouth 1 day or 1 dose.    . Cholecalciferol ([employer] VIT D 5000 HIGH-POTENCY PO) Take by mouth.    . citalopram (CELEXA) 20 MG tablet Take 20 mg by mouth every evening.   11  . dexamethasone (DECADRON) 4 MG tablet Take 1 tablets by mouth once a day on the day after chemotherapy and then take 1 tablets two times a day for 2 days. Take with food. 30 tablet 1  . loratadine (CLARITIN) 10 MG tablet Take 10 mg by mouth daily.    . ondansetron (ZOFRAN) 8 MG tablet Take 1 tablet (8 mg total) by mouth 2 (two) times daily as needed. Start on the third day after chemotherapy. 30 tablet 1  . prochlorperazine (COMPAZINE) 10 MG tablet Take 1 tablet (10 mg total) by mouth every 6 (six) hours as needed (Nausea or vomiting). 30 tablet 1  . RESTASIS 0.05 % ophthalmic emulsion   9  . zolpidem (AMBIEN) 10 MG tablet   5  .  ciprofloxacin (CIPRO) 500 MG tablet Take 1 tablet (500 mg total) by mouth 2 (two) times daily. 10 tablet 0  . lidocaine-prilocaine (EMLA) cream Apply to affected area once (Patient not taking: Reported on 01/04/2015) 30 g 3  . LORazepam (ATIVAN) 0.5 MG tablet Take 1 tablet (0.5 mg total) by mouth at bedtime. (Patient not  taking: Reported on 01/04/2015) 30 tablet 0  . omeprazole (PRILOSEC) 40 MG capsule Take 1 capsule (40 mg total) by mouth daily. 30 capsule 3  . oxyCODONE-acetaminophen (ROXICET) 5-325 MG per tablet Take 1 tablet by mouth every 4 (four) hours as needed. (Patient not taking: Reported on 01/04/2015) 30 tablet 0  . UNABLE TO FIND 1 each by Other route continuous as needed. 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 1 each 1   No current facility-administered medications for this visit.    PHYSICAL EXAMINATION: ECOG PERFORMANCE STATUS: 1 - Symptomatic but completely ambulatory  Filed Vitals:   01/04/15 1442  BP: 109/63  Pulse: 94  Temp: 99.7 F (37.6 C)  Resp: 18   Filed Weights   01/04/15 1442  Weight: 187 lb 9.6 oz (85.095 kg)    GENERAL:alert, no distress and comfortable SKIN: skin color, texture, turgor are normal, no rashes or significant lesions EYES: normal, Conjunctiva are pink and non-injected, sclera clear OROPHARYNX:no exudate, no erythema and lips, buccal mucosa, and tongue normal  NECK: supple, thyroid normal size, non-tender, without nodularity LYMPH:  no palpable lymphadenopathy in the cervical, axillary or inguinal LUNGS: clear to auscultation and percussion with normal breathing effort HEART: regular rate & rhythm and no murmurs and no lower extremity edema ABDOMEN:abdomen soft, non-tender and normal bowel sounds Musculoskeletal:no cyanosis of digits and no clubbing  NEURO: alert & oriented x 3 with fluent speech, no focal motor/sensory deficits   LABORATORY DATA:  I have reviewed the data as listed   Chemistry      Component Value Date/Time   NA 135* 01/04/2015 1354   NA 138 11/26/2014 1508   K 3.8 01/04/2015 1354   K 4.6 11/26/2014 1508   CL 103 11/26/2014 1508   CO2 21* 01/04/2015 1354   CO2 25 11/26/2014 1508   BUN 12.2 01/04/2015 1354   BUN 17 11/26/2014 1508   CREATININE 0.6  01/04/2015 1354   CREATININE 0.83 11/26/2014 1508      Component Value Date/Time   CALCIUM 8.4 01/04/2015 1354   CALCIUM 9.3 11/26/2014 1508   ALKPHOS 87 01/04/2015 1354   ALKPHOS 90 11/26/2014 1508   AST 9 01/04/2015 1354   AST 17 11/26/2014 1508   ALT 12 01/04/2015 1354   ALT 15 11/26/2014 1508   BILITOT 0.43 01/04/2015 1354   BILITOT 0.3 11/26/2014 1508       Lab Results  Component Value Date   WBC 0.6* 01/04/2015   HGB 10.8* 01/04/2015   HCT 32.4* 01/04/2015   MCV 85.5 01/04/2015   PLT 90* 01/04/2015   NEUTROABS 0.0* 01/04/2015     RADIOGRAPHIC STUDIES: I have personally reviewed the radiology reports and agreed with their findings. Echocardiogram EF 60-65%  ASSESSMENT & PLAN:  Breast cancer of lower-outer quadrant of right female breast Right lumpectomy 11/27/2014: Invasive ductal carcinoma grade 3, 3.2 cm, with high-grade DCIS, LVI present,, 1/1 intramammary lymph node positive, 1 SLN micro-met, stage IIB, T2 N1a ER 100%, PR 12%, HER-2 negative, Ki-67 51% Patient is enrolled on PREVENT clinical trial Lipitor versus placebo  Treatment plan:  1. Adjuvant chemotherapy with dose dense Adriamycin and Cytoxan 4 followed by Abraxane weekly 12 2. Adjuvant radiation therapy followed by 3. Anti-estrogen therapy  Current treatment: Adjuvant chemotherapy: Cycle 1 day 1 dose dense Adriamycin and Cytoxan Echocardiogram 12/11/2014: EF 60-65% Port has been placed Chemotherapy class is being done Patient understands completely the risks and benefits of chemotherapy She understands how to take antiemetics  Return to clinic in 1 week for toxicity check with my nurse practitioner  No orders of the defined types were placed in this encounter.   The patient has a good understanding of the overall plan. she agrees with it. she will call with any problems that may develop before the next visit here.   Laurie Panda, NP    Patient Care Team: Jonathon Jordan, MD as PCP -  General (Family Medicine)  DIAGNOSIS: No matching staging information was found for the patient.  SUMMARY OF ONCOLOGIC HISTORY:   Breast cancer of lower-outer quadrant of right female breast   11/06/2008 Surgery Left breast lumpectomy: High-grade DCIS 2.8 cm, 18 lymph nodes negative, ER 98%, PR 81%, stage 0, patient refused antiestrogen therapy   10/18/2014 Initial Biopsy Right breast invasive ductal carcinoma with DCIS grade 3, lymphovascular invasion, ER 100%, PR 12%, HER-2 negative ratio 1.4, Ki-67 51%, T2 N0 M0 stage II a clinical stage   10/24/2014 Breast MRI Right breast irregular enhancing mass 2.9 x 2.6 x 2.4 cm, left breast evidence of prior lumpectomy and left axillary dissection   11/27/2014 Surgery Right lumpectomy: Invasive ductal carcinoma grade 3, 3.2 cm, with high-grade DCIS, LVI present,, 1/1 intramammary lymph node positive, 1 SLN micro-met, stage IIB, T2 N1a   12/28/2014 -  Chemotherapy Adjuvant dose dense Adriamycin and Cytoxan X 4 followed by Abraxane weekly 12    CHIEF COMPLIANT: Cycle 1 day 8 dose dense Adriamycin and Cytoxan  INTERVAL HISTORY: Natalie Valdez is a 64 year old with above-mentioned history of right breast cancer with positive lymph node, being treated with adjuvant chemotherapy. Alessandra was depleted of energy for most of this week. She experienced flu like aches and pains, likely secondary to her neulasta injection. She took the claratin as suggested. She did not sleep well secondary to jitters a few days after treatment (related to steroid use. Her appetite was decreased secondary to nausea, but she did not vomit. She has heartburn. She has not had a good bowel movement in a week, just small drops here and there. She denies fevers or chills. She has no mouth sores or rashes.  REVIEW OF SYSTEMS:   Constitutional: Denies fevers, chills or abnormal weight loss Eyes: Denies blurriness of vision Ears, nose, mouth, throat, and face: Denies mucositis or sore  throat Respiratory: Denies cough, dyspnea or wheezes Cardiovascular: Denies palpitation, chest discomfort or lower extremity swelling Gastrointestinal:  Denies nausea, heartburn or change in bowel habits Skin: Denies abnormal skin rashes Lymphatics: Denies new lymphadenopathy or easy bruising Neurological:Denies numbness, tingling or new weaknesses Behavioral/Psych: Mood is stable, no new changes  Breast: Denies any pain from recent surgery All other systems were reviewed with the patient and are negative.  I have reviewed the past medical history, past surgical history, social history and family history with the patient and they are unchanged from previous note.  ALLERGIES:  has No Known Allergies.  MEDICATIONS:  Current Outpatient Prescriptions  Medication Sig Dispense Refill  . Atorvastatin Calcium (INVESTIGATIONAL ATORVASTATIN/PLACEBO) 40 MG tablet [facility] [id number] Take 1 tablet by mouth daily. Take 2 doses (  these doses must be 12 hours apart) prior to first chemotherapy treatment. Then take 1 tablet daily by mouth with or without food. 180 tablet 0  . celecoxib (CELEBREX) 200 MG capsule Take 200 mg by mouth 1 day or 1 dose.    . Cholecalciferol ([employer] VIT D 5000 HIGH-POTENCY PO) Take by mouth.    . citalopram (CELEXA) 20 MG tablet Take 20 mg by mouth every evening.   11  . dexamethasone (DECADRON) 4 MG tablet Take 1 tablets by mouth once a day on the day after chemotherapy and then take 1 tablets two times a day for 2 days. Take with food. 30 tablet 1  . loratadine (CLARITIN) 10 MG tablet Take 10 mg by mouth daily.    . ondansetron (ZOFRAN) 8 MG tablet Take 1 tablet (8 mg total) by mouth 2 (two) times daily as needed. Start on the third day after chemotherapy. 30 tablet 1  . prochlorperazine (COMPAZINE) 10 MG tablet Take 1 tablet (10 mg total) by mouth every 6 (six) hours as needed (Nausea or vomiting). 30 tablet 1  . RESTASIS 0.05 % ophthalmic emulsion   9  . zolpidem (AMBIEN) 10  MG tablet   5  . ciprofloxacin (CIPRO) 500 MG tablet Take 1 tablet (500 mg total) by mouth 2 (two) times daily. 10 tablet 0  . lidocaine-prilocaine (EMLA) cream Apply to affected area once (Patient not taking: Reported on 01/04/2015) 30 g 3  . LORazepam (ATIVAN) 0.5 MG tablet Take 1 tablet (0.5 mg total) by mouth at bedtime. (Patient not taking: Reported on 01/04/2015) 30 tablet 0  . omeprazole (PRILOSEC) 40 MG capsule Take 1 capsule (40 mg total) by mouth daily. 30 capsule 3  . oxyCODONE-acetaminophen (ROXICET) 5-325 MG per tablet Take 1 tablet by mouth every 4 (four) hours as needed. (Patient not taking: Reported on 01/04/2015) 30 tablet 0  . UNABLE TO FIND 1 each by Other route continuous as needed. 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 1 each 1   No current facility-administered medications for this visit.    PHYSICAL EXAMINATION: ECOG PERFORMANCE STATUS: 1 - Symptomatic but completely ambulatory  Filed Vitals:   01/04/15 1442  BP: 109/63  Pulse: 94  Temp: 99.7 F (37.6 C)  Resp: 18   Filed Weights   01/04/15 1442  Weight: 187 lb 9.6 oz (85.095 kg)    GENERAL:alert, no distress and comfortable SKIN: skin color, texture, turgor are normal, no rashes or significant lesions EYES: normal, Conjunctiva are pink and non-injected, sclera clear OROPHARYNX:no exudate, no erythema and lips, buccal mucosa, and tongue normal  NECK: supple, thyroid normal size, non-tender, without nodularity LYMPH:  no palpable lymphadenopathy in the cervical, axillary or inguinal LUNGS: clear to auscultation and percussion with normal breathing effort HEART: regular rate & rhythm and no murmurs and no lower extremity edema ABDOMEN:abdomen soft, non-tender and normal bowel sounds Musculoskeletal:no cyanosis of digits and no clubbing  NEURO: alert & oriented x 3 with fluent speech, no focal motor/sensory deficits   LABORATORY  DATA:  I have reviewed the data as listed   Chemistry      Component Value Date/Time   NA 135* 01/04/2015 1354   NA 138 11/26/2014 1508   K 3.8 01/04/2015 1354   K 4.6 11/26/2014 1508   CL 103 11/26/2014 1508   CO2 21* 01/04/2015 1354   CO2 25 11/26/2014 1508   BUN 12.2 01/04/2015 1354  BUN 17 11/26/2014 1508   CREATININE 0.6 01/04/2015 1354   CREATININE 0.83 11/26/2014 1508      Component Value Date/Time   CALCIUM 8.4 01/04/2015 1354   CALCIUM 9.3 11/26/2014 1508   ALKPHOS 87 01/04/2015 1354   ALKPHOS 90 11/26/2014 1508   AST 9 01/04/2015 1354   AST 17 11/26/2014 1508   ALT 12 01/04/2015 1354   ALT 15 11/26/2014 1508   BILITOT 0.43 01/04/2015 1354   BILITOT 0.3 11/26/2014 1508       Lab Results  Component Value Date   WBC 0.6* 01/04/2015   HGB 10.8* 01/04/2015   HCT 32.4* 01/04/2015   MCV 85.5 01/04/2015   PLT 90* 01/04/2015   NEUTROABS 0.0* 01/04/2015     RADIOGRAPHIC STUDIES: I have personally reviewed the radiology reports and agreed with their findings. Echocardiogram EF 60-65%  ASSESSMENT & PLAN:  Breast cancer of lower-outer quadrant of right female breast Right lumpectomy 11/27/2014: Invasive ductal carcinoma grade 3, 3.2 cm, with high-grade DCIS, LVI present,, 1/1 intramammary lymph node positive, 1 SLN micro-met, stage IIB, T2 N1a ER 100%, PR 12%, HER-2 negative, Ki-67 51% Patient is enrolled on PREVENT clinical trial Lipitor versus placebo  Treatment plan: 1. Adjuvant chemotherapy with dose dense Adriamycin and Cytoxan 4 followed by Abraxane weekly 12 2. Adjuvant radiation therapy followed by 3. Anti-estrogen therapy  Current treatment: Adjuvant chemotherapy: Cycle 1 day 8 dose dense Adriamycin and Cytoxan Echocardiogram 12/11/2014: EF 60-65%  Chemo toxicities:  1. Nausea: managed with zofran and compazine 2. Bowel irregularity: suggested miralax daily until soft formed stools are achieved at least every other day 3. Heartburn: likely the  result of steroids. Prescribed prilosec 98m daily 4. neulasta bone pain: claritin x 7 days, advil PRN pain 5. Profound neutropenia: ANC 0.0, WBC 0.6. cipro 5075mBID x 5 days prescribed. Suggest dose reducing with next cycle of treatment  Return to clinic in 1 week for cycle 2 of treatment  No orders of the defined types were placed in this encounter.   The patient has a good understanding of the overall plan. she agrees with it. she will call with any problems that may develop before the next visit here.   HeLaurie PandaNP

## 2015-01-04 NOTE — Patient Instructions (Signed)

## 2015-01-04 NOTE — Telephone Encounter (Signed)
Appointments made and avs printed for patient °

## 2015-01-11 ENCOUNTER — Encounter: Payer: Self-pay | Admitting: Nurse Practitioner

## 2015-01-11 ENCOUNTER — Ambulatory Visit: Payer: 59

## 2015-01-11 ENCOUNTER — Ambulatory Visit (HOSPITAL_BASED_OUTPATIENT_CLINIC_OR_DEPARTMENT_OTHER): Payer: 59 | Admitting: Nurse Practitioner

## 2015-01-11 ENCOUNTER — Other Ambulatory Visit: Payer: 59

## 2015-01-11 ENCOUNTER — Ambulatory Visit (HOSPITAL_BASED_OUTPATIENT_CLINIC_OR_DEPARTMENT_OTHER): Payer: 59

## 2015-01-11 ENCOUNTER — Other Ambulatory Visit (HOSPITAL_BASED_OUTPATIENT_CLINIC_OR_DEPARTMENT_OTHER): Payer: 59

## 2015-01-11 VITALS — BP 129/66 | HR 80 | Temp 97.6°F | Resp 18 | Wt 187.5 lb

## 2015-01-11 DIAGNOSIS — Z006 Encounter for examination for normal comparison and control in clinical research program: Secondary | ICD-10-CM

## 2015-01-11 DIAGNOSIS — C50511 Malignant neoplasm of lower-outer quadrant of right female breast: Secondary | ICD-10-CM

## 2015-01-11 DIAGNOSIS — Z5111 Encounter for antineoplastic chemotherapy: Secondary | ICD-10-CM

## 2015-01-11 DIAGNOSIS — Z5189 Encounter for other specified aftercare: Secondary | ICD-10-CM | POA: Diagnosis not present

## 2015-01-11 DIAGNOSIS — Z95828 Presence of other vascular implants and grafts: Secondary | ICD-10-CM

## 2015-01-11 LAB — CBC WITH DIFFERENTIAL/PLATELET
BASO%: 0.4 % (ref 0.0–2.0)
Basophils Absolute: 0 10*3/uL (ref 0.0–0.1)
EOS ABS: 0 10*3/uL (ref 0.0–0.5)
EOS%: 0.2 % (ref 0.0–7.0)
HCT: 34.6 % — ABNORMAL LOW (ref 34.8–46.6)
HGB: 11.3 g/dL — ABNORMAL LOW (ref 11.6–15.9)
LYMPH%: 19.7 % (ref 14.0–49.7)
MCH: 28.6 pg (ref 25.1–34.0)
MCHC: 32.7 g/dL (ref 31.5–36.0)
MCV: 87.6 fL (ref 79.5–101.0)
MONO#: 0.5 10*3/uL (ref 0.1–0.9)
MONO%: 9.9 % (ref 0.0–14.0)
NEUT#: 3.5 10*3/uL (ref 1.5–6.5)
NEUT%: 69.8 % (ref 38.4–76.8)
PLATELETS: 239 10*3/uL (ref 145–400)
RBC: 3.95 10*6/uL (ref 3.70–5.45)
RDW: 13 % (ref 11.2–14.5)
WBC: 5 10*3/uL (ref 3.9–10.3)
lymph#: 1 10*3/uL (ref 0.9–3.3)

## 2015-01-11 LAB — COMPREHENSIVE METABOLIC PANEL (CC13)
ALBUMIN: 3.3 g/dL — AB (ref 3.5–5.0)
ALT: 18 U/L (ref 0–55)
AST: 14 U/L (ref 5–34)
Alkaline Phosphatase: 95 U/L (ref 40–150)
Anion Gap: 7 mEq/L (ref 3–11)
BUN: 13.7 mg/dL (ref 7.0–26.0)
CALCIUM: 8.4 mg/dL (ref 8.4–10.4)
CO2: 25 mEq/L (ref 22–29)
Chloride: 110 mEq/L — ABNORMAL HIGH (ref 98–109)
Creatinine: 0.7 mg/dL (ref 0.6–1.1)
EGFR: 89 mL/min/{1.73_m2} — ABNORMAL LOW (ref >90)
Glucose: 149 mg/dl — ABNORMAL HIGH (ref 70–140)
Potassium: 4 mEq/L (ref 3.5–5.1)
SODIUM: 141 meq/L (ref 136–145)
Total Bilirubin: 0.27 mg/dL (ref 0.20–1.20)
Total Protein: 6.1 g/dL — ABNORMAL LOW (ref 6.4–8.3)

## 2015-01-11 MED ORDER — PALONOSETRON HCL INJECTION 0.25 MG/5ML
INTRAVENOUS | Status: AC
  Filled 2015-01-11: qty 5

## 2015-01-11 MED ORDER — SODIUM CHLORIDE 0.9 % IJ SOLN
10.0000 mL | INTRAMUSCULAR | Status: DC | PRN
  Administered 2015-01-11: 10 mL
  Filled 2015-01-11: qty 10

## 2015-01-11 MED ORDER — PALONOSETRON HCL INJECTION 0.25 MG/5ML
0.2500 mg | Freq: Once | INTRAVENOUS | Status: AC
  Administered 2015-01-11: 0.25 mg via INTRAVENOUS

## 2015-01-11 MED ORDER — DOXORUBICIN HCL CHEMO IV INJECTION 2 MG/ML
60.0000 mg/m2 | Freq: Once | INTRAVENOUS | Status: AC
  Administered 2015-01-11: 118 mg via INTRAVENOUS
  Filled 2015-01-11: qty 59

## 2015-01-11 MED ORDER — SODIUM CHLORIDE 0.9 % IV SOLN
Freq: Once | INTRAVENOUS | Status: AC
  Administered 2015-01-11: 10:00:00 via INTRAVENOUS
  Filled 2015-01-11: qty 5

## 2015-01-11 MED ORDER — HEPARIN SOD (PORK) LOCK FLUSH 100 UNIT/ML IV SOLN
500.0000 [IU] | Freq: Once | INTRAVENOUS | Status: AC | PRN
  Administered 2015-01-11: 500 [IU]
  Filled 2015-01-11: qty 5

## 2015-01-11 MED ORDER — SODIUM CHLORIDE 0.9 % IV SOLN
600.0000 mg/m2 | Freq: Once | INTRAVENOUS | Status: AC
  Administered 2015-01-11: 1180 mg via INTRAVENOUS
  Filled 2015-01-11: qty 59

## 2015-01-11 MED ORDER — SODIUM CHLORIDE 0.9 % IJ SOLN
10.0000 mL | INTRAMUSCULAR | Status: DC | PRN
  Administered 2015-01-11: 10 mL via INTRAVENOUS
  Filled 2015-01-11: qty 10

## 2015-01-11 MED ORDER — SODIUM CHLORIDE 0.9 % IV SOLN
Freq: Once | INTRAVENOUS | Status: AC
  Administered 2015-01-11: 10:00:00 via INTRAVENOUS

## 2015-01-11 MED ORDER — PEGFILGRASTIM 6 MG/0.6ML ~~LOC~~ PSKT
6.0000 mg | PREFILLED_SYRINGE | Freq: Once | SUBCUTANEOUS | Status: AC
  Administered 2015-01-11: 6 mg via SUBCUTANEOUS
  Filled 2015-01-11: qty 0.6

## 2015-01-11 NOTE — Patient Instructions (Signed)

## 2015-01-11 NOTE — Progress Notes (Signed)
Patient Care Team: Jonathon Jordan, MD as PCP - General (Family Medicine)  DIAGNOSIS: No matching staging information was found for the patient.  SUMMARY OF ONCOLOGIC HISTORY:   Breast cancer of lower-outer quadrant of right female breast   11/06/2008 Surgery Left breast lumpectomy: High-grade DCIS 2.8 cm, 18 lymph nodes negative, ER 98%, PR 81%, stage 0, patient refused antiestrogen therapy   10/18/2014 Initial Biopsy Right breast invasive ductal carcinoma with DCIS grade 3, lymphovascular invasion, ER 100%, PR 12%, HER-2 negative ratio 1.4, Ki-67 51%, T2 N0 M0 stage II a clinical stage   10/24/2014 Breast MRI Right breast irregular enhancing mass 2.9 x 2.6 x 2.4 cm, left breast evidence of prior lumpectomy and left axillary dissection   11/27/2014 Surgery Right lumpectomy: Invasive ductal carcinoma grade 3, 3.2 cm, with high-grade DCIS, LVI present,, 1/1 intramammary lymph node positive, 1 SLN micro-met, stage IIB, T2 N1a   12/28/2014 -  Chemotherapy Adjuvant dose dense Adriamycin and Cytoxan X 4 followed by Abraxane weekly 12    CHIEF COMPLIANT: Cycle 2 day 1 dose dense Adriamycin and Cytoxan  INTERVAL HISTORY: Natalie Valdez is a 64 year old with above-mentioned history of right breast cancer with positive lymph node, being treated with adjuvant chemotherapy. Carollee looks better today. She denies fevers, chills, nausea, or vomiting. Her bowels have returned to normal with miralax use. Her bone pain is gone. Her appetite improved. She heartburn is no longer an issue. She denies mouth sores or rashes.  REVIEW OF SYSTEMS:   Constitutional: Denies fevers, chills or abnormal weight loss Eyes: Denies blurriness of vision Ears, nose, mouth, throat, and face: Denies mucositis or sore throat Respiratory: Denies cough, dyspnea or wheezes Cardiovascular: Denies palpitation, chest discomfort or lower extremity swelling Gastrointestinal:  Denies nausea, heartburn or change in bowel habits Skin: Denies  abnormal skin rashes Lymphatics: Denies new lymphadenopathy or easy bruising Neurological:Denies numbness, tingling or new weaknesses Behavioral/Psych: Mood is stable, no new changes  Breast: Denies any pain from recent surgery All other systems were reviewed with the patient and are negative.  I have reviewed the past medical history, past surgical history, social history and family history with the patient and they are unchanged from previous note.  ALLERGIES:  has No Known Allergies.  MEDICATIONS:  Current Outpatient Prescriptions  Medication Sig Dispense Refill  . Atorvastatin Calcium (INVESTIGATIONAL ATORVASTATIN/PLACEBO) 40 MG tablet [facility] [id number] Take 1 tablet by mouth daily. Take 2 doses (these doses must be 12 hours apart) prior to first chemotherapy treatment. Then take 1 tablet daily by mouth with or without food. 180 tablet 0  . celecoxib (CELEBREX) 200 MG capsule Take 200 mg by mouth 1 day or 1 dose.    . Cholecalciferol ([employer] VIT D 5000 HIGH-POTENCY PO) Take by mouth.    . citalopram (CELEXA) 20 MG tablet Take 20 mg by mouth every evening.   11  . dexamethasone (DECADRON) 4 MG tablet Take 1 tablets by mouth once a day on the day after chemotherapy and then take 1 tablets two times a day for 2 days. Take with food. 30 tablet 1  . lidocaine-prilocaine (EMLA) cream Apply to affected area once 30 g 3  . loratadine (CLARITIN) 10 MG tablet Take 10 mg by mouth daily.    Marland Kitchen omeprazole (PRILOSEC) 40 MG capsule Take 1 capsule (40 mg total) by mouth daily. 30 capsule 3  . ondansetron (ZOFRAN) 8 MG tablet Take 1 tablet (8 mg total) by mouth 2 (two) times daily as  needed. Start on the third day after chemotherapy. 30 tablet 1  . prochlorperazine (COMPAZINE) 10 MG tablet Take 1 tablet (10 mg total) by mouth every 6 (six) hours as needed (Nausea or vomiting). 30 tablet 1  . RESTASIS 0.05 % ophthalmic emulsion   9  . UNABLE TO FIND 1 each by Other route continuous as needed. 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 1 each 1  . zolpidem (AMBIEN) 10 MG tablet   5  . LORazepam (ATIVAN) 0.5 MG tablet Take 1 tablet (0.5 mg total) by mouth at bedtime. (Patient not taking: Reported on 01/04/2015) 30 tablet 0  . oxyCODONE-acetaminophen (ROXICET) 5-325 MG per tablet Take 1 tablet by mouth every 4 (four) hours as needed. (Patient not taking: Reported on 01/04/2015) 30 tablet 0   No current facility-administered medications for this visit.    PHYSICAL EXAMINATION: ECOG PERFORMANCE STATUS: 1 - Symptomatic but completely ambulatory  Filed Vitals:   01/11/15 0910  BP: 129/66  Pulse: 80  Temp: 97.6 F (36.4 C)  Resp: 18   Filed Weights   01/11/15 0910  Weight: 187 lb 8 oz (85.049 kg)    GENERAL:alert, no distress and comfortable SKIN: skin color, texture, turgor are normal, no rashes or significant lesions EYES: normal, Conjunctiva are pink and non-injected, sclera clear OROPHARYNX:no exudate, no erythema and lips, buccal mucosa, and tongue normal  NECK: supple, thyroid normal size, non-tender, without nodularity LYMPH:  no palpable lymphadenopathy in the cervical, axillary or inguinal LUNGS: clear to auscultation and percussion with normal breathing effort HEART: regular rate & rhythm and no murmurs and no lower extremity edema ABDOMEN:abdomen soft, non-tender and normal bowel sounds Musculoskeletal:no cyanosis of digits and no clubbing  NEURO: alert & oriented x 3 with fluent speech, no focal motor/sensory deficits   LABORATORY DATA:  I have reviewed the data as listed   Chemistry      Component Value Date/Time   NA 141 01/11/2015 0833   NA 138 11/26/2014 1508   K 4.0 01/11/2015 0833   K 4.6 11/26/2014 1508   CL 103 11/26/2014 1508   CO2 25 01/11/2015 0833   CO2 25 11/26/2014 1508   BUN 13.7 01/11/2015 0833   BUN 17 11/26/2014 1508   CREATININE 0.7 01/11/2015 0833   CREATININE 0.83  11/26/2014 1508      Component Value Date/Time   CALCIUM 8.4 01/11/2015 0833   CALCIUM 9.3 11/26/2014 1508   ALKPHOS 95 01/11/2015 0833   ALKPHOS 90 11/26/2014 1508   AST 14 01/11/2015 0833   AST 17 11/26/2014 1508   ALT 18 01/11/2015 0833   ALT 15 11/26/2014 1508   BILITOT 0.27 01/11/2015 0833   BILITOT 0.3 11/26/2014 1508       Lab Results  Component Value Date   WBC 5.0 01/11/2015   HGB 11.3* 01/11/2015   HCT 34.6* 01/11/2015   MCV 87.6 01/11/2015   PLT 239 01/11/2015   NEUTROABS 3.5 01/11/2015     RADIOGRAPHIC STUDIES: I have personally reviewed the radiology reports and agreed with their findings. Echocardiogram EF 60-65%  ASSESSMENT & PLAN:  Breast cancer of lower-outer quadrant of right female breast Right lumpectomy 11/27/2014: Invasive ductal carcinoma grade 3, 3.2 cm, with high-grade DCIS, LVI present,, 1/1 intramammary lymph node positive, 1 SLN micro-met, stage IIB, T2 N1a ER 100%, PR 12%, HER-2 negative, Ki-67 51% Patient is enrolled on PREVENT clinical trial Lipitor versus placebo  Treatment plan:  1. Adjuvant chemotherapy with dose dense Adriamycin and Cytoxan 4 followed by Abraxane weekly 12 2. Adjuvant radiation therapy followed by 3. Anti-estrogen therapy  Current treatment: Adjuvant chemotherapy: Cycle 2 day 1 dose dense Adriamycin and Cytoxan Echocardiogram 12/11/2014: EF 60-65%  Chemo toxicities:  1. Nausea: managed with zofran and compazine 2. Constipation: managed with miralax 3. Heartburn: likely the result of steroids. Prescribed prilosec 65m daily 4. neulasta bone pain: claritin x 7 days, advil PRN pain 5. Neutropenia: ANC improved to 3.5 this week. Consulted with Dr. MJana Hakimregarding dose, and he advised to keep it the same as she was afebrile and rebounded well.  Return to clinic in 1 week for a nadir visit.  No orders of the defined types were placed in this encounter.   The patient has a good understanding of the overall plan.  she agrees with it. she will call with any problems that may develop before the next visit here.   HLaurie Panda NP

## 2015-01-11 NOTE — Patient Instructions (Signed)
[facility] Discharge Instructions for Patients Receiving Chemotherapy  Today you received the following chemotherapy agents Adriamycin and Cytoxan.  To help prevent nausea and vomiting after your treatment, we encourage you to take your nausea medication as prescribed.   If you develop nausea and vomiting that is not controlled by your nausea medication, call the clinic.   BELOW ARE SYMPTOMS THAT SHOULD BE REPORTED IMMEDIATELY:  *FEVER GREATER THAN 100.5 F  *CHILLS WITH OR WITHOUT FEVER  NAUSEA AND VOMITING THAT IS NOT CONTROLLED WITH YOUR NAUSEA MEDICATION  *UNUSUAL SHORTNESS OF BREATH  *UNUSUAL BRUISING OR BLEEDING  TENDERNESS IN MOUTH AND THROAT WITH OR WITHOUT PRESENCE OF ULCERS  *URINARY PROBLEMS  *BOWEL PROBLEMS  UNUSUAL RASH Items with * indicate a potential emergency and should be followed up as soon as possible.  Feel free to call the clinic you have any questions or concerns. The clinic phone number is [phone].  Please show the CHEMO ALERT CARD at check-in to the Emergency Department and triage nurse.   

## 2015-01-15 ENCOUNTER — Telehealth: Payer: Self-pay | Admitting: Nurse Practitioner

## 2015-01-15 NOTE — Telephone Encounter (Signed)
Patient had left a message to add flush appointments to her schedule,done and patient called

## 2015-01-18 ENCOUNTER — Other Ambulatory Visit: Payer: 59

## 2015-01-18 ENCOUNTER — Ambulatory Visit (HOSPITAL_BASED_OUTPATIENT_CLINIC_OR_DEPARTMENT_OTHER): Payer: 59 | Admitting: Nurse Practitioner

## 2015-01-18 ENCOUNTER — Encounter: Payer: Self-pay | Admitting: *Deleted

## 2015-01-18 ENCOUNTER — Ambulatory Visit (HOSPITAL_BASED_OUTPATIENT_CLINIC_OR_DEPARTMENT_OTHER): Payer: 59

## 2015-01-18 ENCOUNTER — Encounter: Payer: Self-pay | Admitting: Nurse Practitioner

## 2015-01-18 ENCOUNTER — Other Ambulatory Visit (HOSPITAL_BASED_OUTPATIENT_CLINIC_OR_DEPARTMENT_OTHER): Payer: 59

## 2015-01-18 VITALS — BP 124/7 | HR 98 | Temp 98.4°F | Resp 18 | Ht 64.0 in | Wt 186.7 lb

## 2015-01-18 DIAGNOSIS — C50511 Malignant neoplasm of lower-outer quadrant of right female breast: Secondary | ICD-10-CM

## 2015-01-18 DIAGNOSIS — Z006 Encounter for examination for normal comparison and control in clinical research program: Secondary | ICD-10-CM

## 2015-01-18 DIAGNOSIS — Z95828 Presence of other vascular implants and grafts: Secondary | ICD-10-CM

## 2015-01-18 DIAGNOSIS — D701 Agranulocytosis secondary to cancer chemotherapy: Secondary | ICD-10-CM

## 2015-01-18 DIAGNOSIS — T451X5A Adverse effect of antineoplastic and immunosuppressive drugs, initial encounter: Secondary | ICD-10-CM

## 2015-01-18 LAB — CBC WITH DIFFERENTIAL/PLATELET
BASO%: 0 % (ref 0.0–2.0)
BASOS ABS: 0 10*3/uL (ref 0.0–0.1)
EOS ABS: 0 10*3/uL (ref 0.0–0.5)
EOS%: 1.6 % (ref 0.0–7.0)
HEMATOCRIT: 33.2 % — AB (ref 34.8–46.6)
HEMOGLOBIN: 11.1 g/dL — AB (ref 11.6–15.9)
LYMPH%: 84.3 % — ABNORMAL HIGH (ref 14.0–49.7)
MCH: 28.5 pg (ref 25.1–34.0)
MCHC: 33.4 g/dL (ref 31.5–36.0)
MCV: 85.3 fL (ref 79.5–101.0)
MONO#: 0 10*3/uL — AB (ref 0.1–0.9)
MONO%: 9.7 % (ref 0.0–14.0)
NEUT%: 4.4 % — AB (ref 38.4–76.8)
NEUTROS ABS: 0 10*3/uL — AB (ref 1.5–6.5)
PLATELETS: 167 10*3/uL (ref 145–400)
RBC: 3.89 10*6/uL (ref 3.70–5.45)
RDW: 13.4 % (ref 11.2–14.5)
WBC: 0.3 10*3/uL — AB (ref 3.9–10.3)
lymph#: 0.3 10*3/uL — ABNORMAL LOW (ref 0.9–3.3)

## 2015-01-18 LAB — COMPREHENSIVE METABOLIC PANEL (CC13)
ALT: 10 U/L (ref 0–55)
ANION GAP: 7 meq/L (ref 3–11)
AST: 10 U/L (ref 5–34)
Albumin: 3.4 g/dL — ABNORMAL LOW (ref 3.5–5.0)
Alkaline Phosphatase: 110 U/L (ref 40–150)
BILIRUBIN TOTAL: 0.49 mg/dL (ref 0.20–1.20)
BUN: 16.7 mg/dL (ref 7.0–26.0)
CO2: 26 meq/L (ref 22–29)
CREATININE: 0.6 mg/dL (ref 0.6–1.1)
Calcium: 8.6 mg/dL (ref 8.4–10.4)
Chloride: 105 mEq/L (ref 98–109)
EGFR: >90 mL/min/{1.73_m2} (ref >90)
Glucose: 122 mg/dl (ref 70–140)
Potassium: 4 mEq/L (ref 3.5–5.1)
SODIUM: 138 meq/L (ref 136–145)
Total Protein: 6.1 g/dL — ABNORMAL LOW (ref 6.4–8.3)

## 2015-01-18 MED ORDER — HEPARIN SOD (PORK) LOCK FLUSH 100 UNIT/ML IV SOLN
500.0000 [IU] | Freq: Once | INTRAVENOUS | Status: AC
Start: 2015-01-18 — End: 2015-01-18
  Administered 2015-01-18: 500 [IU] via INTRAVENOUS
  Filled 2015-01-18: qty 5

## 2015-01-18 MED ORDER — SODIUM CHLORIDE 0.9 % IJ SOLN
10.0000 mL | INTRAMUSCULAR | Status: DC | PRN
  Administered 2015-01-18: 10 mL via INTRAVENOUS
  Filled 2015-01-18: qty 10

## 2015-01-18 MED ORDER — CIPROFLOXACIN HCL 500 MG PO TABS: 500.0000 mg | ORAL_TABLET | Freq: Two times a day (BID) | ORAL | Status: DC

## 2015-01-18 NOTE — Patient Instructions (Signed)

## 2015-01-18 NOTE — Progress Notes (Signed)
01/18/2015  1445  PREVENT Received PREVENT clinical report back for cardiac MRI done on 12/20/14 from [facility].  The report had an abnormal reading, identifying a lesion in the right breast.  Her lumpectomy had also been done on the right side.  Dr. Lindi Adie was made aware of the findings, read the report, and was given a copy.   He said he would address this next week when the patient comes in on 01/25/15. Marcellus Scott, RN Clinical Research

## 2015-01-18 NOTE — Progress Notes (Signed)
Patient Care Team: Jonathon Jordan, MD as PCP - General (Family Medicine)  DIAGNOSIS: No matching staging information was found for the patient.  SUMMARY OF ONCOLOGIC HISTORY:   Breast cancer of lower-outer quadrant of right female breast   11/06/2008 Surgery Left breast lumpectomy: High-grade DCIS 2.8 cm, 18 lymph nodes negative, ER 98%, PR 81%, stage 0, patient refused antiestrogen therapy   10/18/2014 Initial Biopsy Right breast invasive ductal carcinoma with DCIS grade 3, lymphovascular invasion, ER 100%, PR 12%, HER-2 negative ratio 1.4, Ki-67 51%, T2 N0 M0 stage II a clinical stage   10/24/2014 Breast MRI Right breast irregular enhancing mass 2.9 x 2.6 x 2.4 cm, left breast evidence of prior lumpectomy and left axillary dissection   11/27/2014 Surgery Right lumpectomy: Invasive ductal carcinoma grade 3, 3.2 cm, with high-grade DCIS, LVI present,, 1/1 intramammary lymph node positive, 1 SLN micro-met, stage IIB, T2 N1a   12/28/2014 -  Chemotherapy Adjuvant dose dense Adriamycin and Cytoxan X 4 followed by Abraxane weekly 12    CHIEF COMPLIANT: Cycle 2 day 8 dose dense Adriamycin and Cytoxan  INTERVAL HISTORY: Natalie Valdez is a 64 year old with above-mentioned history of right breast cancer with positive lymph node, being treated with adjuvant chemotherapy. This cycle went better than the first. She denies fevers, chills, nausea, or vomiting. Her bowels are moving well. Her appetite is fair. She had no bone pain at all with the claritin and ibuprofen. She was able to work some this week. She does have mild fatigue. Her main complaint this week is anxiety. She has had some deaths in the family and her child had a seizure so she is rightfully stressed.   REVIEW OF SYSTEMS:   Constitutional: Denies fevers, chills or abnormal weight loss Eyes: Denies blurriness of vision Ears, nose, mouth, throat, and face: Denies mucositis or sore throat Respiratory: Denies cough, dyspnea or  wheezes Cardiovascular: Denies palpitation, chest discomfort or lower extremity swelling Gastrointestinal:  Denies nausea, heartburn or change in bowel habits Skin: Denies abnormal skin rashes Lymphatics: Denies new lymphadenopathy or easy bruising Neurological:Denies numbness, tingling or new weaknesses Behavioral/Psych: Mood is stable, no new changes  Breast: Denies any pain from recent surgery All other systems were reviewed with the patient and are negative.  I have reviewed the past medical history, past surgical history, social history and family history with the patient and they are unchanged from previous note.  ALLERGIES:  has No Known Allergies.  MEDICATIONS:  Current Outpatient Prescriptions  Medication Sig Dispense Refill  . Atorvastatin Calcium (INVESTIGATIONAL ATORVASTATIN/PLACEBO) 40 MG tablet [facility] [id number] Take 1 tablet by mouth daily. Take 2 doses (these doses must be 12 hours apart) prior to first chemotherapy treatment. Then take 1 tablet daily by mouth with or without food. 180 tablet 0  . celecoxib (CELEBREX) 200 MG capsule Take 200 mg by mouth 1 day or 1 dose.    . Cholecalciferol ([employer]S VIT D 5000 HIGH-POTENCY PO) Take by mouth.    . citalopram (CELEXA) 20 MG tablet Take 20 mg by mouth every evening.   11  . loratadine (CLARITIN) 10 MG tablet Take 10 mg by mouth daily.    Marland Kitchen omeprazole (PRILOSEC) 40 MG capsule Take 1 capsule (40 mg total) by mouth daily. 30 capsule 3  . RESTASIS 0.05 % ophthalmic emulsion   9  . UNABLE TO FIND 1 each by Other route continuous as needed. 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 1 each 1  . zolpidem (AMBIEN) 10 MG tablet   5  . ciprofloxacin (CIPRO) 500 MG tablet Take 1 tablet (500 mg total) by mouth 2 (two) times daily. 10 tablet 0  . dexamethasone (DECADRON) 4 MG tablet Take 1 tablets by mouth once a day on the day after chemotherapy and then take 1  tablets two times a day for 2 days. Take with food. (Patient not taking: Reported on 01/18/2015) 30 tablet 1  . lidocaine-prilocaine (EMLA) cream Apply to affected area once (Patient not taking: Reported on 01/18/2015) 30 g 3  . LORazepam (ATIVAN) 0.5 MG tablet Take 1 tablet (0.5 mg total) by mouth at bedtime. (Patient not taking: Reported on 01/04/2015) 30 tablet 0  . ondansetron (ZOFRAN) 8 MG tablet Take 1 tablet (8 mg total) by mouth 2 (two) times daily as needed. Start on the third day after chemotherapy. (Patient not taking: Reported on 01/18/2015) 30 tablet 1  . oxyCODONE-acetaminophen (ROXICET) 5-325 MG per tablet Take 1 tablet by mouth every 4 (four) hours as needed. (Patient not taking: Reported on 01/04/2015) 30 tablet 0  . prochlorperazine (COMPAZINE) 10 MG tablet Take 1 tablet (10 mg total) by mouth every 6 (six) hours as needed (Nausea or vomiting). (Patient not taking: Reported on 01/18/2015) 30 tablet 1   No current facility-administered medications for this visit.    PHYSICAL EXAMINATION: ECOG PERFORMANCE STATUS: 1 - Symptomatic but completely ambulatory  Filed Vitals:   01/18/15 1255  BP: 124/7  Pulse: 98  Temp: 98.4 F (36.9 C)  Resp: 18   Filed Weights   01/18/15 1255  Weight: 186 lb 11.2 oz (84.687 kg)    GENERAL:alert, no distress and comfortable SKIN: skin color, texture, turgor are normal, no rashes or significant lesions EYES: normal, Conjunctiva are pink and non-injected, sclera clear OROPHARYNX:no exudate, no erythema and lips, buccal mucosa, and tongue normal  NECK: supple, thyroid normal size, non-tender, without nodularity LYMPH:  no palpable lymphadenopathy in the cervical, axillary or inguinal LUNGS: clear to auscultation and percussion with normal breathing effort HEART: regular rate & rhythm and no murmurs and no lower extremity edema ABDOMEN:abdomen soft, non-tender and normal bowel sounds Musculoskeletal:no cyanosis of digits and no clubbing  NEURO:  alert & oriented x 3 with fluent speech, no focal motor/sensory deficits   LABORATORY DATA:  I have reviewed the data as listed   Chemistry      Component Value Date/Time   NA 138 01/18/2015 1151   NA 138 11/26/2014 1508   K 4.0 01/18/2015 1151   K 4.6 11/26/2014 1508   CL 103 11/26/2014 1508   CO2 26 01/18/2015 1151   CO2 25 11/26/2014 1508   BUN 16.7 01/18/2015 1151   BUN 17 11/26/2014 1508   CREATININE 0.6 01/18/2015 1151   CREATININE 0.83 11/26/2014 1508      Component Value Date/Time   CALCIUM 8.6 01/18/2015 1151   CALCIUM 9.3 11/26/2014 1508   ALKPHOS 110 01/18/2015 1151   ALKPHOS 90 11/26/2014 1508   AST 10 01/18/2015 1151   AST 17 11/26/2014 1508   ALT 10 01/18/2015 1151   ALT 15 11/26/2014 1508   BILITOT 0.49 01/18/2015 1151   BILITOT 0.3 11/26/2014 1508       Lab Results  Component Value Date   WBC 0.3* 01/18/2015   HGB 11.1* 01/18/2015   HCT 33.2* 01/18/2015   MCV 85.3 01/18/2015   PLT 167 01/18/2015   NEUTROABS 0.0* 01/18/2015  RADIOGRAPHIC STUDIES: I have personally reviewed the radiology reports and agreed with their findings. Echocardiogram EF 60-65%  ASSESSMENT & PLAN:  Breast cancer of lower-outer quadrant of right female breast Right lumpectomy 11/27/2014: Invasive ductal carcinoma grade 3, 3.2 cm, with high-grade DCIS, LVI present,, 1/1 intramammary lymph node positive, 1 SLN micro-met, stage IIB, T2 N1a ER 100%, PR 12%, HER-2 negative, Ki-67 51% Patient is enrolled on PREVENT clinical trial Lipitor versus placebo  Treatment plan: 1. Adjuvant chemotherapy with dose dense Adriamycin and Cytoxan 4 followed by Abraxane weekly 12 2. Adjuvant radiation therapy followed by 3. Anti-estrogen therapy  Current treatment: Adjuvant chemotherapy: Cycle 2 day 8 dose dense Adriamycin and Cytoxan Echocardiogram 12/11/2014: EF 60-65%  Chemo toxicities:  1. Nausea: managed with zofran and compazine 2. Constipation: managed with miralax 3.  Heartburn: likely the result of steroids. Prescribed prilosec 71m daily.  4. neulasta bone pain: claritin x 7 days, advil PRN pain  5. Neutropenia: ANC 0.0, WBC 0.3. cipro 5052mBID x 5 days prescribed. Patient encouraged to maintain good hand hygiene, avoid crowds and sick contacts, and to notify usKoreaf any fevers 100.5For greater  Return to clinic in 1 week for a nadir visit.  No orders of the defined types were placed in this encounter.   The patient has a good understanding of the overall plan. she agrees with it. she will call with any problems that may develop before the next visit here.   HeLaurie PandaNP

## 2015-01-24 ENCOUNTER — Telehealth: Payer: Self-pay

## 2015-01-24 NOTE — Assessment & Plan Note (Addendum)
Right lumpectomy 11/27/2014: Invasive ductal carcinoma grade 3, 3.2 cm, with high-grade DCIS, LVI present,, 1/1 intramammary lymph node positive, 1 SLN micro-met, stage IIB, T2 N1a ER 100%, PR 12%, HER-2 negative, Ki-67 51% Patient is enrolled on PREVENT clinical trial Lipitor versus placebo  Treatment plan: 1. Adjuvant chemotherapy with dose dense Adriamycin and Cytoxan 4 followed by Abraxane weekly 12 2. Adjuvant radiation therapy followed by 3. Anti-estrogen therapy  Current treatment: Adjuvant chemotherapy: Cycle 3 day 1 dose dense Adriamycin and Cytoxan Echocardiogram 12/11/2014: EF 60-65%  Chemo toxicities:  1. Nausea: managed with zofran and compazine 2. Constipation: managed with miralax 3. Heartburn: likely the result of steroids. Prescribed prilosec 68m daily.  4. neulasta bone pain: claritin x 7 days, advil PRN pain  5. Neutropenia: ANC 0.0, WBC 0.3. cipro 5042mBID x 5 days prescribed. Patient encouraged to maintain good hand hygiene, avoid crowds and sick contacts, and to notify usKoreaf any fevers 100.5For greater  [facility] identifying a lesion in the right breast. I will get a mammogram and Ultrasound to evaluate this.  Return to clinic in 2 weeks for a cycle 4.

## 2015-01-24 NOTE — Telephone Encounter (Signed)
PREVENT Clinical Report dtd 01/17/15 rcvd from [facility].  Reviewed by Dr. Lindi Adie.  Sent to scan.

## 2015-01-25 ENCOUNTER — Ambulatory Visit: Payer: 59

## 2015-01-25 ENCOUNTER — Other Ambulatory Visit: Payer: 59

## 2015-01-25 ENCOUNTER — Ambulatory Visit (HOSPITAL_BASED_OUTPATIENT_CLINIC_OR_DEPARTMENT_OTHER): Payer: 59 | Admitting: Hematology and Oncology

## 2015-01-25 ENCOUNTER — Encounter: Payer: Self-pay | Admitting: *Deleted

## 2015-01-25 ENCOUNTER — Encounter: Payer: Self-pay | Admitting: Hematology and Oncology

## 2015-01-25 ENCOUNTER — Telehealth: Payer: Self-pay | Admitting: Hematology and Oncology

## 2015-01-25 ENCOUNTER — Other Ambulatory Visit (HOSPITAL_BASED_OUTPATIENT_CLINIC_OR_DEPARTMENT_OTHER): Payer: 59

## 2015-01-25 ENCOUNTER — Ambulatory Visit (HOSPITAL_BASED_OUTPATIENT_CLINIC_OR_DEPARTMENT_OTHER): Payer: 59

## 2015-01-25 VITALS — BP 134/67 | HR 92 | Temp 99.2°F | Resp 18 | Ht 64.0 in | Wt 186.8 lb

## 2015-01-25 DIAGNOSIS — Z006 Encounter for examination for normal comparison and control in clinical research program: Secondary | ICD-10-CM | POA: Diagnosis not present

## 2015-01-25 DIAGNOSIS — Z5189 Encounter for other specified aftercare: Secondary | ICD-10-CM | POA: Diagnosis not present

## 2015-01-25 DIAGNOSIS — C50511 Malignant neoplasm of lower-outer quadrant of right female breast: Secondary | ICD-10-CM | POA: Diagnosis not present

## 2015-01-25 DIAGNOSIS — Z5111 Encounter for antineoplastic chemotherapy: Secondary | ICD-10-CM | POA: Diagnosis not present

## 2015-01-25 DIAGNOSIS — Z95828 Presence of other vascular implants and grafts: Secondary | ICD-10-CM

## 2015-01-25 LAB — CBC WITH DIFFERENTIAL/PLATELET
BASO%: 0.3 % (ref 0.0–2.0)
BASOS ABS: 0 10*3/uL (ref 0.0–0.1)
EOS ABS: 0 10*3/uL (ref 0.0–0.5)
EOS%: 0.1 % (ref 0.0–7.0)
HCT: 33 % — ABNORMAL LOW (ref 34.8–46.6)
HGB: 10.9 g/dL — ABNORMAL LOW (ref 11.6–15.9)
LYMPH#: 0.5 10*3/uL — AB (ref 0.9–3.3)
LYMPH%: 7.3 % — ABNORMAL LOW (ref 14.0–49.7)
MCH: 29 pg (ref 25.1–34.0)
MCHC: 33 g/dL (ref 31.5–36.0)
MCV: 87.8 fL (ref 79.5–101.0)
MONO#: 0.7 10*3/uL (ref 0.1–0.9)
MONO%: 10.1 % (ref 0.0–14.0)
NEUT#: 5.8 10*3/uL (ref 1.5–6.5)
NEUT%: 82.2 % — AB (ref 38.4–76.8)
PLATELETS: 151 10*3/uL (ref 145–400)
RBC: 3.76 10*6/uL (ref 3.70–5.45)
RDW: 14.2 % (ref 11.2–14.5)
WBC: 7 10*3/uL (ref 3.9–10.3)

## 2015-01-25 LAB — COMPREHENSIVE METABOLIC PANEL (CC13)
ALT: 16 U/L (ref 0–55)
AST: 12 U/L (ref 5–34)
Albumin: 3.5 g/dL (ref 3.5–5.0)
Alkaline Phosphatase: 116 U/L (ref 40–150)
Anion Gap: 10 mEq/L (ref 3–11)
BILIRUBIN TOTAL: 0.34 mg/dL (ref 0.20–1.20)
BUN: 11.6 mg/dL (ref 7.0–26.0)
CO2: 23 mEq/L (ref 22–29)
Calcium: 8.9 mg/dL (ref 8.4–10.4)
Chloride: 108 mEq/L (ref 98–109)
Creatinine: 0.8 mg/dL (ref 0.6–1.1)
EGFR: 82 mL/min/{1.73_m2} — ABNORMAL LOW (ref >90)
Glucose: 158 mg/dl — ABNORMAL HIGH (ref 70–140)
POTASSIUM: 3.7 meq/L (ref 3.5–5.1)
SODIUM: 141 meq/L (ref 136–145)
Total Protein: 6.2 g/dL — ABNORMAL LOW (ref 6.4–8.3)

## 2015-01-25 MED ORDER — MAGIC MOUTHWASH W/LIDOCAINE: 5.0000 mL | Freq: Three times a day (TID) | ORAL | Status: DC | PRN

## 2015-01-25 MED ORDER — SODIUM CHLORIDE 0.9 % IJ SOLN
10.0000 mL | INTRAMUSCULAR | Status: DC | PRN
  Administered 2015-01-25: 10 mL
  Filled 2015-01-25: qty 10

## 2015-01-25 MED ORDER — SODIUM CHLORIDE 0.9 % IV SOLN
Freq: Once | INTRAVENOUS | Status: AC
  Administered 2015-01-25: 10:00:00 via INTRAVENOUS

## 2015-01-25 MED ORDER — PEGFILGRASTIM 6 MG/0.6ML ~~LOC~~ PSKT
6.0000 mg | PREFILLED_SYRINGE | Freq: Once | SUBCUTANEOUS | Status: AC
  Administered 2015-01-25: 6 mg via SUBCUTANEOUS
  Filled 2015-01-25: qty 0.6

## 2015-01-25 MED ORDER — SODIUM CHLORIDE 0.9 % IJ SOLN
10.0000 mL | INTRAMUSCULAR | Status: DC | PRN
  Administered 2015-01-25: 10 mL via INTRAVENOUS
  Filled 2015-01-25: qty 10

## 2015-01-25 MED ORDER — PALONOSETRON HCL INJECTION 0.25 MG/5ML
0.2500 mg | Freq: Once | INTRAVENOUS | Status: AC
  Administered 2015-01-25: 0.25 mg via INTRAVENOUS

## 2015-01-25 MED ORDER — SODIUM CHLORIDE 0.9 % IV SOLN
600.0000 mg/m2 | Freq: Once | INTRAVENOUS | Status: AC
  Administered 2015-01-25: 1180 mg via INTRAVENOUS
  Filled 2015-01-25: qty 59

## 2015-01-25 MED ORDER — PALONOSETRON HCL INJECTION 0.25 MG/5ML
INTRAVENOUS | Status: AC
  Filled 2015-01-25: qty 5

## 2015-01-25 MED ORDER — FOSAPREPITANT DIMEGLUMINE INJECTION 150 MG
Freq: Once | INTRAVENOUS | Status: AC
  Administered 2015-01-25: 10:00:00 via INTRAVENOUS
  Filled 2015-01-25: qty 5

## 2015-01-25 MED ORDER — DOXORUBICIN HCL CHEMO IV INJECTION 2 MG/ML
60.0000 mg/m2 | Freq: Once | INTRAVENOUS | Status: AC
  Administered 2015-01-25: 118 mg via INTRAVENOUS
  Filled 2015-01-25: qty 59

## 2015-01-25 MED ORDER — HEPARIN SOD (PORK) LOCK FLUSH 100 UNIT/ML IV SOLN
500.0000 [IU] | Freq: Once | INTRAVENOUS | Status: AC | PRN
  Administered 2015-01-25: 500 [IU]
  Filled 2015-01-25: qty 5

## 2015-01-25 NOTE — Progress Notes (Signed)
Patient Care Team: Jonathon Jordan, MD as PCP - General (Family Medicine)  DIAGNOSIS: No matching staging information was found for the patient.  SUMMARY OF ONCOLOGIC HISTORY:   Breast cancer of lower-outer quadrant of right female breast   11/06/2008 Surgery Left breast lumpectomy: High-grade DCIS 2.8 cm, 18 lymph nodes negative, ER 98%, PR 81%, stage 0, patient refused antiestrogen therapy   10/18/2014 Initial Biopsy Right breast invasive ductal carcinoma with DCIS grade 3, lymphovascular invasion, ER 100%, PR 12%, HER-2 negative ratio 1.4, Ki-67 51%, T2 N0 M0 stage II a clinical stage   10/24/2014 Breast MRI Right breast irregular enhancing mass 2.9 x 2.6 x 2.4 cm, left breast evidence of prior lumpectomy and left axillary dissection   11/27/2014 Surgery Right lumpectomy: Invasive ductal carcinoma grade 3, 3.2 cm, with high-grade DCIS, LVI present,, 1/1 intramammary lymph node positive, 1 SLN micro-met, stage IIB, T2 N1a   12/28/2014 -  Chemotherapy Adjuvant dose dense Adriamycin and Cytoxan X 4 followed by Abraxane weekly 12    CHIEF COMPLIANT: Cycle 3 dose dense Adriamycin and Cytoxan  INTERVAL HISTORY: Natalie Valdez is a 64 year old with above-mentioned history of right breast cancer treated with lumpectomy currently on adjuvant chemotherapy. She has tolerated cycle 2 of treatment extremely well. After cycle one she had a lot of nausea and fatigue issues. With cycle 2 somehow her symptoms have improved significantly. She did not take any nausea medication. She just felt tired for the whole week. She also had small mouth sores on the roof of the mouth.   REVIEW OF SYSTEMS:   Constitutional: Denies fevers, chills or abnormal weight loss Eyes: Denies blurriness of vision Ears, nose, mouth, throat, and face: Denies mucositis or sore throat Respiratory: Denies cough, dyspnea or wheezes Cardiovascular: Denies palpitation, chest discomfort or lower extremity swelling Gastrointestinal:  Denies  nausea, heartburn or change in bowel habits Skin: Denies abnormal skin rashes Lymphatics: Denies new lymphadenopathy or easy bruising Neurological:Denies numbness, tingling or new weaknesses Behavioral/Psych: Mood is stable, no new changes   All other systems were reviewed with the patient and are negative.  I have reviewed the past medical history, past surgical history, social history and family history with the patient and they are unchanged from previous note.  ALLERGIES:  has No Known Allergies.  MEDICATIONS:  Current Outpatient Prescriptions  Medication Sig Dispense Refill  . Atorvastatin Calcium (INVESTIGATIONAL ATORVASTATIN/PLACEBO) 40 MG tablet [facility] [id number] Take 1 tablet by mouth daily. Take 2 doses (these doses must be 12 hours apart) prior to first chemotherapy treatment. Then take 1 tablet daily by mouth with or without food. 180 tablet 0  . celecoxib (CELEBREX) 200 MG capsule Take 200 mg by mouth 1 day or 1 dose.    . Cholecalciferol ([employer]S VIT D 5000 HIGH-POTENCY PO) Take by mouth.    . ciprofloxacin (CIPRO) 500 MG tablet Take 1 tablet (500 mg total) by mouth 2 (two) times daily. 10 tablet 0  . citalopram (CELEXA) 20 MG tablet Take 20 mg by mouth every evening.   11  . dexamethasone (DECADRON) 4 MG tablet Take 1 tablets by mouth once a day on the day after chemotherapy and then take 1 tablets two times a day for 2 days. Take with food. (Patient not taking: Reported on 01/18/2015) 30 tablet 1  . lidocaine-prilocaine (EMLA) cream Apply to affected area once (Patient not taking: Reported on 01/18/2015) 30 g 3  . loratadine (CLARITIN) 10 MG tablet Take 10 mg by mouth daily.    Marland Kitchen  LORazepam (ATIVAN) 0.5 MG tablet Take 1 tablet (0.5 mg total) by mouth at bedtime. (Patient not taking: Reported on 01/04/2015) 30 tablet 0  . omeprazole (PRILOSEC) 40 MG capsule Take 1 capsule (40 mg total) by mouth daily. 30 capsule 3  . ondansetron (ZOFRAN) 8 MG tablet Take 1 tablet (8 mg total) by  mouth 2 (two) times daily as needed. Start on the third day after chemotherapy. (Patient not taking: Reported on 01/18/2015) 30 tablet 1  . oxyCODONE-acetaminophen (ROXICET) 5-325 MG per tablet Take 1 tablet by mouth every 4 (four) hours as needed. (Patient not taking: Reported on 01/04/2015) 30 tablet 0  . prochlorperazine (COMPAZINE) 10 MG tablet Take 1 tablet (10 mg total) by mouth every 6 (six) hours as needed (Nausea or vomiting). (Patient not taking: Reported on 01/18/2015) 30 tablet 1  . RESTASIS 0.05 % ophthalmic emulsion   9  . UNABLE TO FIND 1 each by Other route continuous as needed. 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 1 each 1  . zolpidem (AMBIEN) 10 MG tablet   5   No current facility-administered medications for this visit.    PHYSICAL EXAMINATION: ECOG PERFORMANCE STATUS: 1 - Symptomatic but completely ambulatory  Filed Vitals:   01/25/15 0849  BP: 134/67  Pulse: 92  Temp: 99.2 F (37.3 C)  Resp: 18   Filed Weights   01/25/15 0849  Weight: 186 lb 12.8 oz (84.732 kg)    GENERAL:alert, no distress and comfortable SKIN: skin color, texture, turgor are normal, no rashes or significant lesions EYES: normal, Conjunctiva are pink and non-injected, sclera clear OROPHARYNX:no exudate, no erythema and lips, buccal mucosa, and tongue normal  NECK: supple, thyroid normal size, non-tender, without nodularity LYMPH:  no palpable lymphadenopathy in the cervical, axillary or inguinal LUNGS: clear to auscultation and percussion with normal breathing effort HEART: regular rate & rhythm and no murmurs and no lower extremity edema ABDOMEN:abdomen soft, non-tender and normal bowel sounds Musculoskeletal:no cyanosis of digits and no clubbing  NEURO: alert & oriented x 3 with fluent speech, no focal motor/sensory deficits  LABORATORY DATA:  I have reviewed the data as listed   Chemistry      Component  Value Date/Time   NA 138 01/18/2015 1151   NA 138 11/26/2014 1508   K 4.0 01/18/2015 1151   K 4.6 11/26/2014 1508   CL 103 11/26/2014 1508   CO2 26 01/18/2015 1151   CO2 25 11/26/2014 1508   BUN 16.7 01/18/2015 1151   BUN 17 11/26/2014 1508   CREATININE 0.6 01/18/2015 1151   CREATININE 0.83 11/26/2014 1508      Component Value Date/Time   CALCIUM 8.6 01/18/2015 1151   CALCIUM 9.3 11/26/2014 1508   ALKPHOS 110 01/18/2015 1151   ALKPHOS 90 11/26/2014 1508   AST 10 01/18/2015 1151   AST 17 11/26/2014 1508   ALT 10 01/18/2015 1151   ALT 15 11/26/2014 1508   BILITOT 0.49 01/18/2015 1151   BILITOT 0.3 11/26/2014 1508       Lab Results  Component Value Date   WBC 7.0 01/25/2015   HGB 10.9* 01/25/2015   HCT 33.0* 01/25/2015   MCV 87.8 01/25/2015   PLT 151 01/25/2015   NEUTROABS 5.8 01/25/2015     ASSESSMENT & PLAN:  Breast cancer of lower-outer quadrant of right female breast Right lumpectomy 11/27/2014: Invasive ductal carcinoma grade 3, 3.2 cm, with high-grade DCIS, LVI present,, 1/1 intramammary lymph  node positive, 1 SLN micro-met, stage IIB, T2 N1a ER 100%, PR 12%, HER-2 negative, Ki-67 51% Patient is enrolled on PREVENT clinical trial Lipitor versus placebo  Treatment plan: 1. Adjuvant chemotherapy with dose dense Adriamycin and Cytoxan 4 followed by Abraxane weekly 12 2. Adjuvant radiation therapy followed by 3. Anti-estrogen therapy  Current treatment: Adjuvant chemotherapy: Cycle 3 day 1 dose dense Adriamycin and Cytoxan Echocardiogram 12/11/2014: EF 60-65%  Chemo toxicities:  1. Nausea: managed with zofran and compazine 2. Constipation: managed with miralax 3. Heartburn: likely the result of steroids. Prescribed prilosec 82m daily.  4. neulasta bone pain: claritin x 7 days, advil PRN pain  5. Neutropenia: ANC 0.0, WBC 0.3. cipro 5031mBID x 5 days prescribed. Patient encouraged to maintain good hand hygiene, avoid crowds and sick contacts, and to  notify usKoreaf any fevers 100.5For greater  [facility], identifying a lesion in the right breast. I will get a mammogram and Ultrasound to evaluate this.  PREVENT trial: No toxicities to the treatment liver function tests are normal, no myalgias  Return to clinic in 2 weeks for a cycle 4.  No orders of the defined types were placed in this encounter.   The patient has a good understanding of the overall plan. she agrees with it. she will call with any problems that may develop before the next visit here.   GuRulon EisenmengerMD

## 2015-01-25 NOTE — Telephone Encounter (Signed)
Appointments made and avs printed for patient,[facility] 01/29/15 8:45

## 2015-01-25 NOTE — Patient Instructions (Signed)

## 2015-01-25 NOTE — Progress Notes (Signed)
01/25/2015 0900  PREVENT study 1 month check The patient is in the clinic this morning for MD visit prior to chemotherapy.  The patient reports have no side effects from study drug (Atorvastatin vs. Placebo).  She denies muscles aches, abdominal pain, headache, respiratory problems, or increased swelling in extremities above her baseline.  Her LFT's are WNL.  She confirmed that she has continued to take study drug as prescribed and has not missed any doses.  She was given an August calendar at today's visit.  She denied any questions or concerns.  This RN thanked the patient for her continued participation in the study.  She knows to call for questions or concerns. Marcellus Scott, RN Clinical Research

## 2015-01-25 NOTE — Addendum Note (Signed)
Addended by: Prentiss Bells on: 01/25/2015 09:42 AM   Modules accepted: Medications

## 2015-01-25 NOTE — Patient Instructions (Signed)
[place] Discharge Instructions for Patients Receiving Chemotherapy  Today you received the following chemotherapy agents: Adriamycin. To help prevent nausea and vomiting after your treatment, we encourage you to take your nausea medication.   If you develop nausea and vomiting that is not controlled by your nausea medication, call the clinic.   BELOW ARE SYMPTOMS THAT SHOULD BE REPORTED IMMEDIATELY:  *FEVER GREATER THAN 100.5 F  *CHILLS WITH OR WITHOUT FEVER  NAUSEA AND VOMITING THAT IS NOT CONTROLLED WITH YOUR NAUSEA MEDICATION  *UNUSUAL SHORTNESS OF BREATH  *UNUSUAL BRUISING OR BLEEDING  TENDERNESS IN MOUTH AND THROAT WITH OR WITHOUT PRESENCE OF ULCERS  *URINARY PROBLEMS  *BOWEL PROBLEMS  UNUSUAL RASH Items with * indicate a potential emergency and should be followed up as soon as possible.  Feel free to call the clinic you have any questions or concerns. The clinic phone number is [phone].  Please show the [place] at check-in to the Emergency Department and triage nurse.

## 2015-02-04 ENCOUNTER — Telehealth: Payer: Self-pay | Admitting: *Deleted

## 2015-02-04 DIAGNOSIS — C50511 Malignant neoplasm of lower-outer quadrant of right female breast: Secondary | ICD-10-CM

## 2015-02-04 MED ORDER — AMOXICILLIN-POT CLAVULANATE 875-125 MG PO TABS: 1.0000 | ORAL_TABLET | Freq: Two times a day (BID) | ORAL | Status: DC

## 2015-02-04 NOTE — Telephone Encounter (Signed)
ON 01/31/15 PT. BEGAN HAVING COLD LIKE SYMPTOMS. TEMPERATURE IS 99.3. CONGESTION IN HER HEAD AND CHEST. RATTLING NON PRODUCTIVE COUGH. SHORTNESS OF BREATH WITH EXERTION. SHE HAS BEEN IN BED MOST OF THE WEEKEND. PT. HAD CHEMO ON 01/25/15.

## 2015-02-04 NOTE — Telephone Encounter (Signed)
Per note below, spoke with patient.  She reports clear drainage, cough is mostly in upper chest and at night.  Pt reports she "can't do anything".  Performs one task and has to go lie down - extreme fatigue, sob w/exertion, weakness.  Advised pt to listen to her body and rest and that I would call her back after speaking with Dr. Lindi Adie.    Per Dr. Lindi Adie, augmentin 875 bid for 7 days.  Let pt know augmentin called in to [facility].  Pt voiced udnerstanding.

## 2015-02-08 ENCOUNTER — Ambulatory Visit: Payer: 59

## 2015-02-08 ENCOUNTER — Other Ambulatory Visit (HOSPITAL_BASED_OUTPATIENT_CLINIC_OR_DEPARTMENT_OTHER): Payer: 59

## 2015-02-08 ENCOUNTER — Telehealth: Payer: Self-pay | Admitting: Nurse Practitioner

## 2015-02-08 ENCOUNTER — Telehealth: Payer: Self-pay

## 2015-02-08 ENCOUNTER — Other Ambulatory Visit: Payer: 59

## 2015-02-08 ENCOUNTER — Ambulatory Visit (HOSPITAL_BASED_OUTPATIENT_CLINIC_OR_DEPARTMENT_OTHER): Payer: 59

## 2015-02-08 ENCOUNTER — Ambulatory Visit (HOSPITAL_BASED_OUTPATIENT_CLINIC_OR_DEPARTMENT_OTHER): Payer: 59 | Admitting: Nurse Practitioner

## 2015-02-08 ENCOUNTER — Encounter: Payer: Self-pay | Admitting: Nurse Practitioner

## 2015-02-08 ENCOUNTER — Encounter: Payer: Self-pay | Admitting: *Deleted

## 2015-02-08 VITALS — BP 126/67 | HR 86 | Temp 98.7°F | Resp 18 | Ht 64.0 in | Wt 184.0 lb

## 2015-02-08 DIAGNOSIS — C50511 Malignant neoplasm of lower-outer quadrant of right female breast: Secondary | ICD-10-CM | POA: Diagnosis not present

## 2015-02-08 DIAGNOSIS — Z006 Encounter for examination for normal comparison and control in clinical research program: Secondary | ICD-10-CM

## 2015-02-08 DIAGNOSIS — Z95828 Presence of other vascular implants and grafts: Secondary | ICD-10-CM

## 2015-02-08 DIAGNOSIS — Z5189 Encounter for other specified aftercare: Secondary | ICD-10-CM | POA: Diagnosis not present

## 2015-02-08 DIAGNOSIS — Z5111 Encounter for antineoplastic chemotherapy: Secondary | ICD-10-CM

## 2015-02-08 LAB — COMPREHENSIVE METABOLIC PANEL (CC13)
ALK PHOS: 100 U/L (ref 40–150)
ALT: 17 U/L (ref 0–55)
AST: 15 U/L (ref 5–34)
Albumin: 3.3 g/dL — ABNORMAL LOW (ref 3.5–5.0)
Anion Gap: 6 mEq/L (ref 3–11)
BUN: 13 mg/dL (ref 7.0–26.0)
CHLORIDE: 109 meq/L (ref 98–109)
CO2: 24 meq/L (ref 22–29)
CREATININE: 0.7 mg/dL (ref 0.6–1.1)
Calcium: 8.9 mg/dL (ref 8.4–10.4)
Glucose: 96 mg/dl (ref 70–140)
Potassium: 3.8 mEq/L (ref 3.5–5.1)
Sodium: 138 mEq/L (ref 136–145)
TOTAL PROTEIN: 6.1 g/dL — AB (ref 6.4–8.3)
Total Bilirubin: 0.27 mg/dL (ref 0.20–1.20)

## 2015-02-08 LAB — CBC WITH DIFFERENTIAL/PLATELET
BASO%: 0.5 % (ref 0.0–2.0)
BASOS ABS: 0 10*3/uL (ref 0.0–0.1)
EOS%: 0.1 % (ref 0.0–7.0)
Eosinophils Absolute: 0 10*3/uL (ref 0.0–0.5)
HEMATOCRIT: 27.5 % — AB (ref 34.8–46.6)
HEMOGLOBIN: 9.3 g/dL — AB (ref 11.6–15.9)
LYMPH%: 5.4 % — ABNORMAL LOW (ref 14.0–49.7)
MCH: 29.1 pg (ref 25.1–34.0)
MCHC: 33.7 g/dL (ref 31.5–36.0)
MCV: 86.5 fL (ref 79.5–101.0)
MONO#: 0.6 10*3/uL (ref 0.1–0.9)
MONO%: 7.8 % (ref 0.0–14.0)
NEUT#: 6.2 10*3/uL (ref 1.5–6.5)
NEUT%: 86.2 % — AB (ref 38.4–76.8)
Platelets: 204 10*3/uL (ref 145–400)
RBC: 3.18 10*6/uL — AB (ref 3.70–5.45)
RDW: 15.8 % — ABNORMAL HIGH (ref 11.2–14.5)
WBC: 7.2 10*3/uL (ref 3.9–10.3)
lymph#: 0.4 10*3/uL — ABNORMAL LOW (ref 0.9–3.3)

## 2015-02-08 MED ORDER — SODIUM CHLORIDE 0.9 % IV SOLN
600.0000 mg/m2 | Freq: Once | INTRAVENOUS | Status: AC
  Administered 2015-02-08: 1180 mg via INTRAVENOUS
  Filled 2015-02-08: qty 59

## 2015-02-08 MED ORDER — DOXORUBICIN HCL CHEMO IV INJECTION 2 MG/ML
60.0000 mg/m2 | Freq: Once | INTRAVENOUS | Status: AC
  Administered 2015-02-08: 118 mg via INTRAVENOUS
  Filled 2015-02-08: qty 59

## 2015-02-08 MED ORDER — PEGFILGRASTIM 6 MG/0.6ML ~~LOC~~ PSKT
6.0000 mg | PREFILLED_SYRINGE | Freq: Once | SUBCUTANEOUS | Status: AC
  Administered 2015-02-08: 6 mg via SUBCUTANEOUS
  Filled 2015-02-08: qty 0.6

## 2015-02-08 MED ORDER — PALONOSETRON HCL INJECTION 0.25 MG/5ML
0.2500 mg | Freq: Once | INTRAVENOUS | Status: AC
  Administered 2015-02-08: 0.25 mg via INTRAVENOUS

## 2015-02-08 MED ORDER — PALONOSETRON HCL INJECTION 0.25 MG/5ML
INTRAVENOUS | Status: AC
Start: 2015-02-08 — End: 2015-02-08
  Filled 2015-02-08: qty 5

## 2015-02-08 MED ORDER — SODIUM CHLORIDE 0.9 % IJ SOLN
10.0000 mL | INTRAMUSCULAR | Status: DC | PRN
  Administered 2015-02-08: 10 mL via INTRAVENOUS
  Filled 2015-02-08: qty 10

## 2015-02-08 MED ORDER — SODIUM CHLORIDE 0.9 % IV SOLN
Freq: Once | INTRAVENOUS | Status: AC
  Administered 2015-02-08: 15:00:00 via INTRAVENOUS

## 2015-02-08 MED ORDER — SODIUM CHLORIDE 0.9 % IJ SOLN
10.0000 mL | INTRAMUSCULAR | Status: DC | PRN
  Administered 2015-02-08: 10 mL
  Filled 2015-02-08: qty 10

## 2015-02-08 MED ORDER — SODIUM CHLORIDE 0.9 % IV SOLN
Freq: Once | INTRAVENOUS | Status: AC
  Administered 2015-02-08: 15:00:00 via INTRAVENOUS
  Filled 2015-02-08: qty 5

## 2015-02-08 MED ORDER — HEPARIN SOD (PORK) LOCK FLUSH 100 UNIT/ML IV SOLN
500.0000 [IU] | Freq: Once | INTRAVENOUS | Status: AC | PRN
  Administered 2015-02-08: 500 [IU]
  Filled 2015-02-08: qty 5

## 2015-02-08 NOTE — Telephone Encounter (Signed)
Gave avs & calendar for July °

## 2015-02-08 NOTE — Patient Instructions (Signed)
[facility] Discharge Instructions for Patients Receiving Chemotherapy  Today you received the following chemotherapy agents: Adriamycin, Cytoxan  To help prevent nausea and vomiting after your treatment, we encourage you to take your nausea medication as prescribed by your physician.   If you develop nausea and vomiting that is not controlled by your nausea medication, call the clinic.   BELOW ARE SYMPTOMS THAT SHOULD BE REPORTED IMMEDIATELY:  *FEVER GREATER THAN 100.5 F  *CHILLS WITH OR WITHOUT FEVER  NAUSEA AND VOMITING THAT IS NOT CONTROLLED WITH YOUR NAUSEA MEDICATION  *UNUSUAL SHORTNESS OF BREATH  *UNUSUAL BRUISING OR BLEEDING  TENDERNESS IN MOUTH AND THROAT WITH OR WITHOUT PRESENCE OF ULCERS  *URINARY PROBLEMS  *BOWEL PROBLEMS  UNUSUAL RASH Items with * indicate a potential emergency and should be followed up as soon as possible.  Feel free to call the clinic you have any questions or concerns. The clinic phone number is [phone].  Please show the [place] at check-in to the Emergency Department and triage nurse.

## 2015-02-08 NOTE — Patient Instructions (Signed)

## 2015-02-08 NOTE — Telephone Encounter (Signed)
Telephone advice record rcvd from [facility] 02/03/15.  Sent to scan.

## 2015-02-08 NOTE — Progress Notes (Signed)
Patient Care Team: Jonathon Jordan, MD as PCP - General (Family Medicine)  DIAGNOSIS: No matching staging information was found for the patient.  SUMMARY OF ONCOLOGIC HISTORY:   Breast cancer of lower-outer quadrant of right female breast   11/06/2008 Surgery Left breast lumpectomy: High-grade DCIS 2.8 cm, 18 lymph nodes negative, ER 98%, PR 81%, stage 0, patient refused antiestrogen therapy   10/18/2014 Initial Biopsy Right breast invasive ductal carcinoma with DCIS grade 3, lymphovascular invasion, ER 100%, PR 12%, HER-2 negative ratio 1.4, Ki-67 51%, T2 N0 M0 stage II a clinical stage   10/24/2014 Breast MRI Right breast irregular enhancing mass 2.9 x 2.6 x 2.4 cm, left breast evidence of prior lumpectomy and left axillary dissection   11/27/2014 Surgery Right lumpectomy: Invasive ductal carcinoma grade 3, 3.2 cm, with high-grade DCIS, LVI present,, 1/1 intramammary lymph node positive, 1 SLN micro-met, stage IIB, T2 N1a   12/28/2014 -  Chemotherapy Adjuvant dose dense Adriamycin and Cytoxan X 4 followed by Abraxane weekly 12    CHIEF COMPLIANT: Cycle 4 dose dense Adriamycin and Cytoxan  INTERVAL HISTORY: Natalie Valdez is a 64 year old with above-mentioned history of right breast cancer treated with lumpectomy currently on adjuvant chemotherapy. She was recently treated with antibiotics for an upper respiratory infection and is slowing improving. She is still coughing some, but her energy level is improving. She denies fevers, chills, nausea, vomiting, or changes in bowel or bladder habits. She uses magic mouth wash and warm salt water rinses for her mouth sores.    REVIEW OF SYSTEMS:   Constitutional: Denies fevers, chills or abnormal weight loss Eyes: Denies blurriness of vision Ears, nose, mouth, throat, and face: mouth sores Respiratory: Denies cough, dyspnea or wheezes Cardiovascular: Denies palpitation, chest discomfort or lower extremity swelling Gastrointestinal:  Denies nausea,  heartburn or change in bowel habits Skin: Denies abnormal skin rashes Lymphatics: Denies new lymphadenopathy or easy bruising Neurological:Denies numbness, tingling or new weaknesses Behavioral/Psych: Mood is stable, no new changes   All other systems were reviewed with the patient and are negative.  I have reviewed the past medical history, past surgical history, social history and family history with the patient and they are unchanged from previous note.  ALLERGIES:  has No Known Allergies.  MEDICATIONS:  Current Outpatient Prescriptions  Medication Sig Dispense Refill  . Alum & Mag Hydroxide-Simeth (MAGIC MOUTHWASH W/LIDOCAINE) SOLN Take 5 mLs by mouth 3 (three) times daily as needed for mouth pain. 60 mL 1  . Atorvastatin Calcium (INVESTIGATIONAL ATORVASTATIN/PLACEBO) 40 MG tablet [facility] [id number] Take 1 tablet by mouth daily. Take 2 doses (these doses must be 12 hours apart) prior to first chemotherapy treatment. Then take 1 tablet daily by mouth with or without food. 180 tablet 0  . celecoxib (CELEBREX) 200 MG capsule Take 200 mg by mouth 1 day or 1 dose.    . Cholecalciferol (CVS VIT D 5000 HIGH-POTENCY PO) Take by mouth.    . ciprofloxacin (CIPRO) 500 MG tablet Take 1 tablet (500 mg total) by mouth 2 (two) times daily. 10 tablet 0  . citalopram (CELEXA) 20 MG tablet Take 20 mg by mouth every evening.   11  . dexamethasone (DECADRON) 4 MG tablet Take 1 tablets by mouth once a day on the day after chemotherapy and then take 1 tablets two times a day for 2 days. Take with food. 30 tablet 1  . lidocaine-prilocaine (EMLA) cream Apply to affected area once 30 g 3  . loratadine (CLARITIN)  10 MG tablet Take 10 mg by mouth daily.    Marland Kitchen LORazepam (ATIVAN) 0.5 MG tablet Take 1 tablet (0.5 mg total) by mouth at bedtime. 30 tablet 0  . omeprazole (PRILOSEC) 40 MG capsule Take 1 capsule (40 mg total) by mouth daily. 30 capsule 3  . ondansetron (ZOFRAN) 8 MG tablet Take 1 tablet (8 mg total) by  mouth 2 (two) times daily as needed. Start on the third day after chemotherapy. 30 tablet 1  . prochlorperazine (COMPAZINE) 10 MG tablet Take 1 tablet (10 mg total) by mouth every 6 (six) hours as needed (Nausea or vomiting). 30 tablet 1  . RESTASIS 0.05 % ophthalmic emulsion   9  . UNABLE TO FIND 1 each by Other route continuous as needed. 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 1 each 1  . oxyCODONE-acetaminophen (ROXICET) 5-325 MG per tablet Take 1 tablet by mouth every 4 (four) hours as needed. (Patient not taking: Reported on 02/08/2015) 30 tablet 0  . zolpidem (AMBIEN) 10 MG tablet   5   No current facility-administered medications for this visit.    PHYSICAL EXAMINATION: ECOG PERFORMANCE STATUS: 1 - Symptomatic but completely ambulatory  Filed Vitals:   02/08/15 1310  BP: 126/67  Pulse: 86  Temp: 98.7 F (37.1 C)  Resp: 18   Filed Weights   02/08/15 1310  Weight: 184 lb (83.462 kg)    GENERAL:alert, no distress and comfortable SKIN: skin color, texture, turgor are normal, no rashes or significant lesions EYES: normal, Conjunctiva are pink and non-injected, sclera clear OROPHARYNX:no exudate, no erythema and lips, buccal mucosa, and tongue normal  NECK: supple, thyroid normal size, non-tender, without nodularity LYMPH:  no palpable lymphadenopathy in the cervical, axillary or inguinal LUNGS: clear to auscultation and percussion with normal breathing effort HEART: regular rate & rhythm and no murmurs and +1 bilateral lower extremity edema  ABDOMEN:abdomen soft, non-tender and normal bowel sounds Musculoskeletal:no cyanosis of digits and no clubbing  NEURO: alert & oriented x 3 with fluent speech, no focal motor/sensory deficits  LABORATORY DATA:  I have reviewed the data as listed   Chemistry      Component Value Date/Time   NA 138 02/08/2015 1226   NA 138 11/26/2014 1508   K 3.8 02/08/2015  1226   K 4.6 11/26/2014 1508   CL 103 11/26/2014 1508   CO2 24 02/08/2015 1226   CO2 25 11/26/2014 1508   BUN 13.0 02/08/2015 1226   BUN 17 11/26/2014 1508   CREATININE 0.7 02/08/2015 1226   CREATININE 0.83 11/26/2014 1508      Component Value Date/Time   CALCIUM 8.9 02/08/2015 1226   CALCIUM 9.3 11/26/2014 1508   ALKPHOS 100 02/08/2015 1226   ALKPHOS 90 11/26/2014 1508   AST 15 02/08/2015 1226   AST 17 11/26/2014 1508   ALT 17 02/08/2015 1226   ALT 15 11/26/2014 1508   BILITOT 0.27 02/08/2015 1226   BILITOT 0.3 11/26/2014 1508       Lab Results  Component Value Date   WBC 7.2 02/08/2015   HGB 9.3* 02/08/2015   HCT 27.5* 02/08/2015   MCV 86.5 02/08/2015   PLT 204 02/08/2015   NEUTROABS 6.2 02/08/2015     ASSESSMENT & PLAN:  Breast cancer of lower-outer quadrant of right female breast Right lumpectomy 11/27/2014: Invasive ductal carcinoma grade 3, 3.2 cm, with high-grade DCIS, LVI present,, 1/1 intramammary lymph node positive, 1 SLN micro-met,  stage IIB, T2 N1a ER 100%, PR 12%, HER-2 negative, Ki-67 51% Patient is enrolled on PREVENT clinical trial Lipitor versus placebo  Treatment plan: 1. Adjuvant chemotherapy with dose dense Adriamycin and Cytoxan 4 followed by Abraxane weekly 12 2. Adjuvant radiation therapy followed by 3. Anti-estrogen therapy  Current treatment: Adjuvant chemotherapy: Cycle 4 day 1 dose dense Adriamycin and Cytoxan Echocardiogram 12/11/2014: EF 60-65%  Chemo toxicities:  1. Nausea: managed with zofran and compazine 2. Constipation: managed with miralax 3. Heartburn: likely the result of steroids. Prescribed prilosec 60m daily.  4. neulasta bone pain: claritin x 7 days, advil PRN pain  5. Neutropenia: ANC 0.0, WBC 0.3. cipro 5034mBID x 5 days prescribed. Patient encouraged to maintain good hand hygiene, avoid crowds and sick contacts, and to notify usKoreaf any fevers 100.5For greater  [facility], identifying a lesion in the right breast. I will get a mammogram and Ultrasound to evaluate this.  PREVENT trial: No toxicities to the treatment liver function tests are normal, no myalgias  Return to clinic in 2 weeks for cycle 1 of abraxane. We briefly discussed this next regimen. She understands that oral dexamethasone and neulasta injections will be removed with this type of chemo.   No orders of the defined types were placed in this encounter.   The patient has a good understanding of the overall plan. she agrees with it. she will call with any problems that may develop before the next visit here.   HeLaurie PandaNP

## 2015-02-11 ENCOUNTER — Telehealth: Payer: Self-pay

## 2015-02-11 NOTE — Telephone Encounter (Signed)
Mammogram results dtd 01/29/15 rcvd from [facility].  Reviewed by Dr. Lindi Adie.  Sent to scan.

## 2015-02-21 NOTE — Assessment & Plan Note (Signed)
Right lumpectomy 11/27/2014: Invasive ductal carcinoma grade 3, 3.2 cm, with high-grade DCIS, LVI present,, 1/1 intramammary lymph node positive, 1 SLN micro-met, stage IIB, T2 N1a ER 100%, PR 12%, HER-2 negative, Ki-67 51% Patient is enrolled on PREVENT clinical trial Lipitor versus placebo  Treatment plan: 1. Adjuvant chemotherapy with dose dense Adriamycin and Cytoxan 4 followed by Abraxane weekly 12 2. Adjuvant radiation therapy followed by 3. Anti-estrogen therapy  Current treatment: Adjuvant chemotherapy: Cycle 4 day 1 dose dense Adriamycin and Cytoxan Echocardiogram 12/11/2014: EF 60-65%  Chemo toxicities:  1. Nausea: managed with zofran and compazine 2. Constipation: managed with miralax 3. Heartburn: likely the result of steroids. Prescribed prilosec 53m daily.  4. neulasta bone pain: claritin x 7 days, advil PRN pain  5. Neutropenia: ANC 0.0, WBC 0.3. cipro 50105mBID x 5 days prescribed. Patient encouraged to maintain good hand hygiene, avoid crowds and sick contacts, and to notify usKoreaf any fevers 100.5For greater  [facility] identifying a lesion in the right breast. I will get a mammogram and Ultrasound to evaluate this.  PREVENT trial: No toxicities to the treatment liver function tests are normal, no myalgias  Return to clinic in 2 weeks for a cycle 1/12 Abraxane.

## 2015-02-22 ENCOUNTER — Ambulatory Visit (HOSPITAL_BASED_OUTPATIENT_CLINIC_OR_DEPARTMENT_OTHER): Payer: 59

## 2015-02-22 ENCOUNTER — Ambulatory Visit: Payer: 59

## 2015-02-22 ENCOUNTER — Other Ambulatory Visit (HOSPITAL_BASED_OUTPATIENT_CLINIC_OR_DEPARTMENT_OTHER): Payer: 59

## 2015-02-22 ENCOUNTER — Telehealth: Payer: Self-pay | Admitting: Hematology and Oncology

## 2015-02-22 ENCOUNTER — Encounter: Payer: Self-pay | Admitting: *Deleted

## 2015-02-22 ENCOUNTER — Ambulatory Visit (HOSPITAL_BASED_OUTPATIENT_CLINIC_OR_DEPARTMENT_OTHER): Payer: 59 | Admitting: Hematology and Oncology

## 2015-02-22 ENCOUNTER — Encounter: Payer: Self-pay | Admitting: Hematology and Oncology

## 2015-02-22 VITALS — BP 130/48 | HR 105 | Temp 98.7°F | Resp 18 | Ht 64.0 in | Wt 176.8 lb

## 2015-02-22 DIAGNOSIS — C50511 Malignant neoplasm of lower-outer quadrant of right female breast: Secondary | ICD-10-CM | POA: Diagnosis not present

## 2015-02-22 DIAGNOSIS — Z95828 Presence of other vascular implants and grafts: Secondary | ICD-10-CM

## 2015-02-22 DIAGNOSIS — Z006 Encounter for examination for normal comparison and control in clinical research program: Secondary | ICD-10-CM | POA: Diagnosis not present

## 2015-02-22 DIAGNOSIS — Z5111 Encounter for antineoplastic chemotherapy: Secondary | ICD-10-CM

## 2015-02-22 LAB — COMPREHENSIVE METABOLIC PANEL (CC13)
ALBUMIN: 3.7 g/dL (ref 3.5–5.0)
ALT: 24 U/L (ref 0–55)
ANION GAP: 9 meq/L (ref 3–11)
AST: 24 U/L (ref 5–34)
Alkaline Phosphatase: 113 U/L (ref 40–150)
BUN: 6.4 mg/dL — ABNORMAL LOW (ref 7.0–26.0)
CHLORIDE: 107 meq/L (ref 98–109)
CO2: 21 mEq/L — ABNORMAL LOW (ref 22–29)
CREATININE: 0.8 mg/dL (ref 0.6–1.1)
Calcium: 9.1 mg/dL (ref 8.4–10.4)
EGFR: 78 mL/min/{1.73_m2} — ABNORMAL LOW (ref >90)
GLUCOSE: 141 mg/dL — AB (ref 70–140)
Potassium: 3.5 mEq/L (ref 3.5–5.1)
SODIUM: 137 meq/L (ref 136–145)
Total Bilirubin: 0.63 mg/dL (ref 0.20–1.20)
Total Protein: 6.4 g/dL (ref 6.4–8.3)

## 2015-02-22 LAB — CBC WITH DIFFERENTIAL/PLATELET
BASO%: 0.4 % (ref 0.0–2.0)
BASOS ABS: 0 10*3/uL (ref 0.0–0.1)
EOS ABS: 0 10*3/uL (ref 0.0–0.5)
EOS%: 0 % (ref 0.0–7.0)
HEMATOCRIT: 26 % — AB (ref 34.8–46.6)
HGB: 8.7 g/dL — ABNORMAL LOW (ref 11.6–15.9)
LYMPH#: 0.3 10*3/uL — AB (ref 0.9–3.3)
LYMPH%: 5.1 % — ABNORMAL LOW (ref 14.0–49.7)
MCH: 29.4 pg (ref 25.1–34.0)
MCHC: 33.5 g/dL (ref 31.5–36.0)
MCV: 87.8 fL (ref 79.5–101.0)
MONO#: 0.8 10*3/uL (ref 0.1–0.9)
MONO%: 14.2 % — AB (ref 0.0–14.0)
NEUT#: 4.2 10*3/uL (ref 1.5–6.5)
NEUT%: 80.3 % — ABNORMAL HIGH (ref 38.4–76.8)
NRBC: 3 % — AB (ref 0–0)
Platelets: 141 10*3/uL — ABNORMAL LOW (ref 145–400)
RBC: 2.96 10*6/uL — AB (ref 3.70–5.45)
RDW: 18.1 % — ABNORMAL HIGH (ref 11.2–14.5)
WBC: 5.3 10*3/uL (ref 3.9–10.3)

## 2015-02-22 MED ORDER — SODIUM CHLORIDE 0.9 % IV SOLN
Freq: Once | INTRAVENOUS | Status: AC
  Administered 2015-02-22: 11:00:00 via INTRAVENOUS

## 2015-02-22 MED ORDER — PALONOSETRON HCL INJECTION 0.25 MG/5ML
INTRAVENOUS | Status: AC
  Filled 2015-02-22: qty 5

## 2015-02-22 MED ORDER — PALONOSETRON HCL INJECTION 0.25 MG/5ML
0.2500 mg | Freq: Once | INTRAVENOUS | Status: AC
  Administered 2015-02-22: 0.25 mg via INTRAVENOUS

## 2015-02-22 MED ORDER — HEPARIN SOD (PORK) LOCK FLUSH 100 UNIT/ML IV SOLN
500.0000 [IU] | Freq: Once | INTRAVENOUS | Status: AC | PRN
  Administered 2015-02-22: 500 [IU]
  Filled 2015-02-22: qty 5

## 2015-02-22 MED ORDER — SODIUM CHLORIDE 0.9 % IJ SOLN
10.0000 mL | INTRAMUSCULAR | Status: DC | PRN
  Administered 2015-02-22: 10 mL
  Filled 2015-02-22: qty 10

## 2015-02-22 MED ORDER — PACLITAXEL PROTEIN-BOUND CHEMO INJECTION 100 MG
75.0000 mg/m2 | Freq: Once | INTRAVENOUS | Status: AC
  Administered 2015-02-22: 150 mg via INTRAVENOUS
  Filled 2015-02-22: qty 30

## 2015-02-22 MED ORDER — SODIUM CHLORIDE 0.9 % IJ SOLN
10.0000 mL | INTRAMUSCULAR | Status: DC | PRN
  Administered 2015-02-22: 10 mL via INTRAVENOUS
  Filled 2015-02-22: qty 10

## 2015-02-22 NOTE — Patient Instructions (Signed)

## 2015-02-22 NOTE — Progress Notes (Signed)
Patient Care Team: Jonathon Jordan, MD as PCP - General (Family Medicine)  DIAGNOSIS: No matching staging information was found for the patient.  SUMMARY OF ONCOLOGIC HISTORY:   Breast cancer of lower-outer quadrant of right female breast   11/06/2008 Surgery Left breast lumpectomy: High-grade DCIS 2.8 cm, 18 lymph nodes negative, ER 98%, PR 81%, stage 0, patient refused antiestrogen therapy   10/18/2014 Initial Biopsy Right breast invasive ductal carcinoma with DCIS grade 3, lymphovascular invasion, ER 100%, PR 12%, HER-2 negative ratio 1.4, Ki-67 51%, T2 N0 M0 stage II a clinical stage   10/24/2014 Breast MRI Right breast irregular enhancing mass 2.9 x 2.6 x 2.4 cm, left breast evidence of prior lumpectomy and left axillary dissection   11/27/2014 Surgery Right lumpectomy: Invasive ductal carcinoma grade 3, 3.2 cm, with high-grade DCIS, LVI present,, 1/1 intramammary lymph node positive, 1 SLN micro-met, stage IIB, T2 N1a   12/28/2014 -  Chemotherapy Adjuvant dose dense Adriamycin and Cytoxan X 4 followed by Abraxane weekly 12    CHIEF COMPLIANT: Cycle 1 Abraxane  INTERVAL HISTORY: Natalie Valdez is a 64 yr old currently on adjuvant chemo with Abraxane. After last cycle, she had profound nausea and has not been eating much food. Shes also very tired. She has no appetite and losing weight. Shes also feeling tremors  REVIEW OF SYSTEMS:   Constitutional: Denies fevers, chills or abnormal weight loss Eyes: Denies blurriness of vision Ears, nose, mouth, throat, and face: Denies mucositis or sore throat Respiratory: Denies cough, dyspnea or wheezes Cardiovascular: Denies palpitation, chest discomfort or lower extremity swelling Gastrointestinal:  Denies nausea, heartburn or change in bowel habits Skin: Denies abnormal skin rashes Lymphatics: Denies new lymphadenopathy or easy bruising Neurological:Denies numbness, tingling or new weaknesses Behavioral/Psych: Mood is stable, no new changes  All  other systems were reviewed with the patient and are negative.  I have reviewed the past medical history, past surgical history, social history and family history with the patient and they are unchanged from previous note.  ALLERGIES:  has No Known Allergies.  MEDICATIONS:  Current Outpatient Prescriptions  Medication Sig Dispense Refill  . Alum & Mag Hydroxide-Simeth (MAGIC MOUTHWASH W/LIDOCAINE) SOLN Take 5 mLs by mouth 3 (three) times daily as needed for mouth pain. 60 mL 1  . Atorvastatin Calcium (INVESTIGATIONAL ATORVASTATIN/PLACEBO) 40 MG tablet [facility] [id number] Take 1 tablet by mouth daily. Take 2 doses (these doses must be 12 hours apart) prior to first chemotherapy treatment. Then take 1 tablet daily by mouth with or without food. 180 tablet 0  . celecoxib (CELEBREX) 200 MG capsule Take 200 mg by mouth 1 day or 1 dose.    . Cholecalciferol ([employer] VIT D 5000 HIGH-POTENCY PO) Take by mouth.    . citalopram (CELEXA) 20 MG tablet Take 20 mg by mouth every evening.   11  . dexamethasone (DECADRON) 4 MG tablet Take 1 tablets by mouth once a day on the day after chemotherapy and then take 1 tablets two times a day for 2 days. Take with food. 30 tablet 1  . lidocaine-prilocaine (EMLA) cream Apply to affected area once 30 g 3  . loratadine (CLARITIN) 10 MG tablet Take 10 mg by mouth daily.    Marland Kitchen LORazepam (ATIVAN) 0.5 MG tablet Take 1 tablet (0.5 mg total) by mouth at bedtime. 30 tablet 0  . omeprazole (PRILOSEC) 40 MG capsule Take 1 capsule (40 mg total) by mouth daily. 30 capsule 3  . ondansetron (ZOFRAN) 8 MG  tablet Take 1 tablet (8 mg total) by mouth 2 (two) times daily as needed. Start on the third day after chemotherapy. 30 tablet 1  . oxyCODONE-acetaminophen (ROXICET) 5-325 MG per tablet Take 1 tablet by mouth every 4 (four) hours as needed. 30 tablet 0  . prochlorperazine (COMPAZINE) 10 MG tablet Take 1 tablet (10 mg total) by mouth every 6 (six) hours as needed (Nausea or vomiting). 30  tablet 1  . RESTASIS 0.05 % ophthalmic emulsion   9  . UNABLE TO FIND 1 each by Other route continuous as needed. 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 1 each 1  . zolpidem (AMBIEN) 10 MG tablet   5   No current facility-administered medications for this visit.    PHYSICAL EXAMINATION: ECOG PERFORMANCE STATUS: 2  GENERAL:alert, no distress and comfortable SKIN: skin color, texture, turgor are normal, no rashes or significant lesions EYES: normal, Conjunctiva are pink and non-injected, sclera clear OROPHARYNX:no exudate, no erythema and lips, buccal mucosa, and tongue normal  NECK: supple, thyroid normal size, non-tender, without nodularity LYMPH:  no palpable lymphadenopathy in the cervical, axillary or inguinal LUNGS: clear to auscultation and percussion with normal breathing effort HEART: regular rate & rhythm and no murmurs and no lower extremity edema ABDOMEN:abdomen soft, non-tender and normal bowel sounds Musculoskeletal:no cyanosis of digits and no clubbing  NEURO: alert & oriented x 3 with fluent speech, no focal motor/sensory deficits  LABORATORY DATA:  I have reviewed the data as listed   Chemistry      Component Value Date/Time   NA 137 02/22/2015 0819   NA 138 11/26/2014 1508   K 3.5 02/22/2015 0819   K 4.6 11/26/2014 1508   CL 103 11/26/2014 1508   CO2 21* 02/22/2015 0819   CO2 25 11/26/2014 1508   BUN 6.4* 02/22/2015 0819   BUN 17 11/26/2014 1508   CREATININE 0.8 02/22/2015 0819   CREATININE 0.83 11/26/2014 1508      Component Value Date/Time   CALCIUM 9.1 02/22/2015 0819   CALCIUM 9.3 11/26/2014 1508   ALKPHOS 113 02/22/2015 0819   ALKPHOS 90 11/26/2014 1508   AST 24 02/22/2015 0819   AST 17 11/26/2014 1508   ALT 24 02/22/2015 0819   ALT 15 11/26/2014 1508   BILITOT 0.63 02/22/2015 0819   BILITOT 0.3 11/26/2014 1508       Lab Results  Component Value Date   WBC 5.3  02/22/2015   HGB 8.7* 02/22/2015   HCT 26.0* 02/22/2015   MCV 87.8 02/22/2015   PLT 141* 02/22/2015   NEUTROABS 4.2 02/22/2015     ASSESSMENT & PLAN:  Breast cancer of lower-outer quadrant of right female breast Right lumpectomy 11/27/2014: Invasive ductal carcinoma grade 3, 3.2 cm, with high-grade DCIS, LVI present,, 1/1 intramammary lymph node positive, 1 SLN micro-met, stage IIB, T2 N1a ER 100%, PR 12%, HER-2 negative, Ki-67 51% Patient is enrolled on PREVENT clinical trial Lipitor versus placebo  Treatment plan: 1. Adjuvant chemotherapy with dose dense Adriamycin and Cytoxan 4 followed by Abraxane weekly 12 2. Adjuvant radiation therapy followed by 3. Anti-estrogen therapy  Current treatment: Adjuvant chemotherapy: Cycle 1/12 Abraxane (decreased dose to 75 mg/m2) Echocardiogram 12/11/2014: EF 60-65%  Chemo toxicities:  1. Nausea: managed with zofran and compazine 2. Constipation: managed with miralax 3. Heartburn: likely the result of steroids. Prescribed prilosec 37m daily.  4. neulasta bone pain: claritin x 7 days, advil PRN pain  5.  Neutropenia: ANC 0.0, WBC 0.3. cipro 562m BID x 5 days prescribed. Patient encouraged to maintain good hand hygiene, avoid crowds and sick contacts, and to notify [place] any fevers 100.5For greater  [facility] The report had an abnormal reading, identifying a lesion in the right breast. mammogram and Ultrasound: Normal  PREVENT trial: No toxicities to the treatment liver function tests are normal, no myalgias  Return to clinic in 1 week for a cycle 2/12 Abraxane.   No orders of the defined types were placed in this encounter.   The patient has a good understanding of the overall plan. she agrees with it. she will call with any problems that may develop before the next visit here.   GRulon Eisenmenger MD

## 2015-02-22 NOTE — Telephone Encounter (Signed)
Appointments made and avs printed for patient °

## 2015-02-22 NOTE — Patient Instructions (Signed)
[facility] Discharge Instructions for Patients Receiving Chemotherapy  Today you received the following chemotherapy agents:  Abraxane  To help prevent nausea and vomiting after your treatment, we encourage you to take your nausea medication as ordered per MD.   If you develop nausea and vomiting that is not controlled by your nausea medication, call the clinic.   BELOW ARE SYMPTOMS THAT SHOULD BE REPORTED IMMEDIATELY:  *FEVER GREATER THAN 100.5 F  *CHILLS WITH OR WITHOUT FEVER  NAUSEA AND VOMITING THAT IS NOT CONTROLLED WITH YOUR NAUSEA MEDICATION  *UNUSUAL SHORTNESS OF BREATH  *UNUSUAL BRUISING OR BLEEDING  TENDERNESS IN MOUTH AND THROAT WITH OR WITHOUT PRESENCE OF ULCERS  *URINARY PROBLEMS  *BOWEL PROBLEMS  UNUSUAL RASH Items with * indicate a potential emergency and should be followed up as soon as possible.  Feel free to call the clinic you have any questions or concerns. The clinic phone number is [phone].  Please show the CHEMO ALERT CARD at check-in to the Emergency Department and triage nurse.   

## 2015-02-25 ENCOUNTER — Telehealth: Payer: Self-pay

## 2015-02-25 NOTE — Telephone Encounter (Signed)
Spoke with patient - she reports she is "doing good".  Denies vomiting, diarrhea, constipation, temperatures.  Advised pt to call clinic with any problems or concerns.  Pt voiced understanding.

## 2015-02-28 ENCOUNTER — Other Ambulatory Visit: Payer: Self-pay | Admitting: *Deleted

## 2015-02-28 DIAGNOSIS — C50511 Malignant neoplasm of lower-outer quadrant of right female breast: Secondary | ICD-10-CM

## 2015-03-01 ENCOUNTER — Other Ambulatory Visit (HOSPITAL_BASED_OUTPATIENT_CLINIC_OR_DEPARTMENT_OTHER): Payer: 59

## 2015-03-01 ENCOUNTER — Encounter: Payer: Self-pay | Admitting: Nurse Practitioner

## 2015-03-01 ENCOUNTER — Other Ambulatory Visit: Payer: Self-pay | Admitting: *Deleted

## 2015-03-01 ENCOUNTER — Telehealth: Payer: Self-pay | Admitting: Nurse Practitioner

## 2015-03-01 ENCOUNTER — Other Ambulatory Visit: Payer: 59

## 2015-03-01 ENCOUNTER — Ambulatory Visit: Payer: 59

## 2015-03-01 ENCOUNTER — Ambulatory Visit (HOSPITAL_BASED_OUTPATIENT_CLINIC_OR_DEPARTMENT_OTHER): Payer: 59

## 2015-03-01 ENCOUNTER — Ambulatory Visit (HOSPITAL_BASED_OUTPATIENT_CLINIC_OR_DEPARTMENT_OTHER): Payer: 59 | Admitting: Nurse Practitioner

## 2015-03-01 ENCOUNTER — Encounter: Payer: Self-pay | Admitting: *Deleted

## 2015-03-01 VITALS — BP 124/57 | HR 109 | Temp 99.5°F | Resp 18 | Ht 64.0 in | Wt 177.6 lb

## 2015-03-01 VITALS — BP 126/64 | HR 118 | Temp 98.6°F | Resp 20

## 2015-03-01 DIAGNOSIS — C773 Secondary and unspecified malignant neoplasm of axilla and upper limb lymph nodes: Secondary | ICD-10-CM

## 2015-03-01 DIAGNOSIS — Z5111 Encounter for antineoplastic chemotherapy: Secondary | ICD-10-CM

## 2015-03-01 DIAGNOSIS — C50511 Malignant neoplasm of lower-outer quadrant of right female breast: Secondary | ICD-10-CM | POA: Diagnosis not present

## 2015-03-01 DIAGNOSIS — Z170 Estrogen receptor positive status [ER+]: Secondary | ICD-10-CM | POA: Diagnosis not present

## 2015-03-01 DIAGNOSIS — D6481 Anemia due to antineoplastic chemotherapy: Secondary | ICD-10-CM

## 2015-03-01 DIAGNOSIS — C50911 Malignant neoplasm of unspecified site of right female breast: Secondary | ICD-10-CM

## 2015-03-01 DIAGNOSIS — T451X5A Adverse effect of antineoplastic and immunosuppressive drugs, initial encounter: Secondary | ICD-10-CM

## 2015-03-01 LAB — COMPREHENSIVE METABOLIC PANEL (CC13)
ALK PHOS: 91 U/L (ref 40–150)
ALT: 30 U/L (ref 0–55)
ANION GAP: 10 meq/L (ref 3–11)
AST: 21 U/L (ref 5–34)
Albumin: 3.4 g/dL — ABNORMAL LOW (ref 3.5–5.0)
BILIRUBIN TOTAL: 0.55 mg/dL (ref 0.20–1.20)
BUN: 7.3 mg/dL (ref 7.0–26.0)
CALCIUM: 9 mg/dL (ref 8.4–10.4)
CHLORIDE: 105 meq/L (ref 98–109)
CO2: 22 meq/L (ref 22–29)
CREATININE: 0.8 mg/dL (ref 0.6–1.1)
EGFR: 78 mL/min/{1.73_m2} — ABNORMAL LOW (ref >90)
GLUCOSE: 166 mg/dL — AB (ref 70–140)
POTASSIUM: 3.8 meq/L (ref 3.5–5.1)
Sodium: 137 mEq/L (ref 136–145)
Total Protein: 6.3 g/dL — ABNORMAL LOW (ref 6.4–8.3)

## 2015-03-01 LAB — CBC WITH DIFFERENTIAL/PLATELET
BASO%: 1.2 % (ref 0.0–2.0)
Basophils Absolute: 0.1 10*3/uL (ref 0.0–0.1)
EOS%: 0.5 % (ref 0.0–7.0)
Eosinophils Absolute: 0 10*3/uL (ref 0.0–0.5)
HCT: 25 % — ABNORMAL LOW (ref 34.8–46.6)
HGB: 8.2 g/dL — ABNORMAL LOW (ref 11.6–15.9)
LYMPH%: 12.9 % — AB (ref 14.0–49.7)
MCH: 29.8 pg (ref 25.1–34.0)
MCHC: 32.8 g/dL (ref 31.5–36.0)
MCV: 90.9 fL (ref 79.5–101.0)
MONO#: 0.7 10*3/uL (ref 0.1–0.9)
MONO%: 12.8 % (ref 0.0–14.0)
NEUT%: 72.6 % (ref 38.4–76.8)
NEUTROS ABS: 4.2 10*3/uL (ref 1.5–6.5)
Platelets: 291 10*3/uL (ref 145–400)
RBC: 2.75 10*6/uL — ABNORMAL LOW (ref 3.70–5.45)
RDW: 20.3 % — ABNORMAL HIGH (ref 11.2–14.5)
WBC: 5.7 10*3/uL (ref 3.9–10.3)
lymph#: 0.7 10*3/uL — ABNORMAL LOW (ref 0.9–3.3)

## 2015-03-01 MED ORDER — PACLITAXEL PROTEIN-BOUND CHEMO INJECTION 100 MG
75.0000 mg/m2 | Freq: Once | INTRAVENOUS | Status: AC
  Administered 2015-03-01: 150 mg via INTRAVENOUS
  Filled 2015-03-01: qty 30

## 2015-03-01 MED ORDER — PALONOSETRON HCL INJECTION 0.25 MG/5ML
0.2500 mg | Freq: Once | INTRAVENOUS | Status: AC
Start: 2015-03-01 — End: 2015-03-01
  Administered 2015-03-01: 0.25 mg via INTRAVENOUS

## 2015-03-01 MED ORDER — HEPARIN SOD (PORK) LOCK FLUSH 100 UNIT/ML IV SOLN
500.0000 [IU] | Freq: Once | INTRAVENOUS | Status: AC | PRN
  Administered 2015-03-01: 500 [IU]
  Filled 2015-03-01: qty 5

## 2015-03-01 MED ORDER — SODIUM CHLORIDE 0.9 % IV SOLN
Freq: Once | INTRAVENOUS | Status: AC
  Administered 2015-03-01: 13:00:00 via INTRAVENOUS

## 2015-03-01 MED ORDER — SODIUM CHLORIDE 0.9 % IJ SOLN
10.0000 mL | INTRAMUSCULAR | Status: DC | PRN
  Administered 2015-03-01: 10 mL via INTRAVENOUS
  Filled 2015-03-01: qty 10

## 2015-03-01 MED ORDER — SODIUM CHLORIDE 0.9 % IJ SOLN
10.0000 mL | INTRAMUSCULAR | Status: DC | PRN
  Administered 2015-03-01: 10 mL
  Filled 2015-03-01: qty 10

## 2015-03-01 MED ORDER — HEPARIN SOD (PORK) LOCK FLUSH 100 UNIT/ML IV SOLN
500.0000 [IU] | Freq: Once | INTRAVENOUS | Status: AC
  Administered 2015-03-01: 500 [IU] via INTRAVENOUS
  Filled 2015-03-01: qty 5

## 2015-03-01 MED ORDER — PALONOSETRON HCL INJECTION 0.25 MG/5ML
INTRAVENOUS | Status: AC
  Filled 2015-03-01: qty 5

## 2015-03-01 NOTE — Progress Notes (Signed)
Patient Care Team: Jonathon Jordan, MD as PCP - General (Family Medicine)  DIAGNOSIS: No matching staging information was found for the patient.  SUMMARY OF ONCOLOGIC HISTORY:   Breast cancer of lower-outer quadrant of right female breast   11/06/2008 Surgery Left breast lumpectomy: High-grade DCIS 2.8 cm, 18 lymph nodes negative, ER 98%, PR 81%, stage 0, patient refused antiestrogen therapy   10/18/2014 Initial Biopsy Right breast invasive ductal carcinoma with DCIS grade 3, lymphovascular invasion, ER 100%, PR 12%, HER-2 negative ratio 1.4, Ki-67 51%, T2 N0 M0 stage II a clinical stage   10/24/2014 Breast MRI Right breast irregular enhancing mass 2.9 x 2.6 x 2.4 cm, left breast evidence of prior lumpectomy and left axillary dissection   11/27/2014 Surgery Right lumpectomy: Invasive ductal carcinoma grade 3, 3.2 cm, with high-grade DCIS, LVI present,, 1/1 intramammary lymph node positive, 1 SLN micro-met, stage IIB, T2 N1a   12/28/2014 -  Chemotherapy Adjuvant dose dense Adriamycin and Cytoxan X 4 followed by Abraxane weekly 12    CHIEF COMPLIANT: Cycle 2 Abraxane  INTERVAL HISTORY: Natalie Valdez is a 64 yr old currently on adjuvant chemo with Abraxane. She has had more depressive episode and has been crying more. Because she is under stress her head tremor is more pronounced. She can stop it if she concentrates. Her appetite is down secondary to taste changes. She denies nausea or vomiting. She continues to work part time despite her fatigue. She has some shortness of breath with exertion. She has heartburn, managed with omeprazole.   REVIEW OF SYSTEMS:   Constitutional: Denies fevers, chills or abnormal weight loss Eyes: Denies blurriness of vision Ears, nose, mouth, throat, and face: Denies mucositis or sore throat Respiratory: Denies cough, dyspnea or wheezes Cardiovascular: Denies palpitation, chest discomfort or lower extremity swelling Gastrointestinal:  Denies nausea or change in bowel  habits Skin: Denies abnormal skin rashes Lymphatics: Denies new lymphadenopathy or easy bruising Neurological:Denies numbness, tingling or new weaknesses Behavioral/Psych: Mood is stable, no new changes  All other systems were reviewed with the patient and are negative.  I have reviewed the past medical history, past surgical history, social history and family history with the patient and they are unchanged from previous note.  ALLERGIES:  has No Known Allergies.  MEDICATIONS:  Current Outpatient Prescriptions  Medication Sig Dispense Refill  . Atorvastatin Calcium (INVESTIGATIONAL ATORVASTATIN/PLACEBO) 40 MG tablet [facility] [id number] Take 1 tablet by mouth daily. Take 2 doses (these doses must be 12 hours apart) prior to first chemotherapy treatment. Then take 1 tablet daily by mouth with or without food. 180 tablet 0  . celecoxib (CELEBREX) 200 MG capsule Take 200 mg by mouth 1 day or 1 dose.    . Cholecalciferol ([employer]S VIT D 5000 HIGH-POTENCY PO) Take by mouth.    . citalopram (CELEXA) 20 MG tablet Take 20 mg by mouth every evening.   11  . loratadine (CLARITIN) 10 MG tablet Take 10 mg by mouth daily.    Marland Kitchen omeprazole (PRILOSEC) 40 MG capsule Take 1 capsule (40 mg total) by mouth daily. 30 capsule 3  . RESTASIS 0.05 % ophthalmic emulsion   9  . UNABLE TO FIND 1 each by Other route continuous as needed. 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 1 each 1  . zolpidem (AMBIEN) 10 MG tablet   5  . Alum & Mag Hydroxide-Simeth (MAGIC MOUTHWASH W/LIDOCAINE) SOLN Take 5 mLs by mouth 3 (  three) times daily as needed for mouth pain. (Patient not taking: Reported on 03/01/2015) 60 mL 1  . oxyCODONE-acetaminophen (ROXICET) 5-325 MG per tablet Take 1 tablet by mouth every 4 (four) hours as needed. (Patient not taking: Reported on 03/01/2015) 30 tablet 0   No current facility-administered medications for this visit.    Facility-Administered Medications Ordered in Other Visits  Medication Dose Route Frequency Provider Last Rate Last Dose  . heparin lock flush 100 unit/mL  500 Units Intracatheter Once PRN Nicholas Lose, MD      . PACLitaxel-protein bound (ABRAXANE) chemo infusion 150 mg  75 mg/m2 (Treatment Plan Actual) Intravenous Once Nicholas Lose, MD      . sodium chloride 0.9 % injection 10 mL  10 mL Intracatheter PRN Nicholas Lose, MD        PHYSICAL EXAMINATION: ECOG PERFORMANCE STATUS: 2  GENERAL:alert, no distress and comfortable SKIN: skin color, texture, turgor are normal, no rashes or significant lesions EYES: normal, Conjunctiva are pink and non-injected, sclera clear OROPHARYNX:no exudate, no erythema and lips, buccal mucosa, and tongue normal  NECK: supple, thyroid normal size, non-tender, without nodularity LYMPH:  no palpable lymphadenopathy in the cervical, axillary or inguinal LUNGS: clear to auscultation and percussion with normal breathing effort HEART: regular rate & rhythm and no murmurs and no lower extremity edema ABDOMEN:abdomen soft, non-tender and normal bowel sounds Musculoskeletal:no cyanosis of digits and no clubbing  NEURO: alert & oriented x 3 with fluent speech, no focal motor/sensory deficits  LABORATORY DATA:  I have reviewed the data as listed   Chemistry      Component Value Date/Time   NA 137 03/01/2015 1019   NA 138 11/26/2014 1508   K 3.8 03/01/2015 1019   K 4.6 11/26/2014 1508   CL 103 11/26/2014 1508   CO2 22 03/01/2015 1019   CO2 25 11/26/2014 1508   BUN 7.3 03/01/2015 1019   BUN 17 11/26/2014 1508   CREATININE 0.8 03/01/2015 1019   CREATININE 0.83 11/26/2014 1508      Component Value Date/Time   CALCIUM 9.0 03/01/2015 1019   CALCIUM 9.3 11/26/2014 1508   ALKPHOS 91 03/01/2015 1019   ALKPHOS 90 11/26/2014 1508   AST 21 03/01/2015 1019   AST 17 11/26/2014 1508   ALT 30 03/01/2015 1019   ALT 15 11/26/2014 1508   BILITOT 0.55 03/01/2015 1019    BILITOT 0.3 11/26/2014 1508       Lab Results  Component Value Date   WBC 5.7 03/01/2015   HGB 8.2* 03/01/2015   HCT 25.0* 03/01/2015   MCV 90.9 03/01/2015   PLT 291 03/01/2015   NEUTROABS 4.2 03/01/2015     ASSESSMENT & PLAN:  Breast cancer of lower-outer quadrant of right female breast Right lumpectomy 11/27/2014: Invasive ductal carcinoma grade 3, 3.2 cm, with high-grade DCIS, LVI present,, 1/1 intramammary lymph node positive, 1 SLN micro-met, stage IIB, T2 N1a ER 100%, PR 12%, HER-2 negative, Ki-67 51% Patient is enrolled on PREVENT clinical trial Lipitor versus placebo  Treatment plan: 1. Adjuvant chemotherapy with dose dense Adriamycin and Cytoxan 4 followed by Abraxane weekly 12 2. Adjuvant radiation therapy followed by 3. Anti-estrogen therapy  Current treatment: Adjuvant chemotherapy: Cycle 1/12 Abraxane (decreased dose to 75 mg/m2) Echocardiogram 12/11/2014: EF 60-65%  Chemo toxicities:  1. Nausea: managed with zofran and compazine 2. Constipation: managed with miralax 3. Heartburn: likely the result of steroids. Prescribed prilosec 66m daily.  4. neulasta bone pain: claritin x 7 days, advil PRN pain  5. Neutropenia: resolved 6. Depression: continue celexa 7. Treatment related anemia: hgb 8.2 today. Will recheck labs on 8/10 and transfuse prior to cycle 3 if necessary.  Heart MRI done on 12/20/14 at [facility]. The report had an abnormal reading, identifying a lesion in the right breast. mammogram and Ultrasound: Normal  PREVENT trial: No toxicities to the treatment liver function tests are normal, no myalgias  Return to clinic in 1 week for a cycle 3/12 Abraxane.  No orders of the defined types were placed in this encounter.   The patient has a good understanding of the overall plan. she agrees with it. she will call with any problems that may develop before the next visit here.   Laurie Panda, NP

## 2015-03-01 NOTE — Telephone Encounter (Signed)
Appointments made and avs printed for patient °

## 2015-03-01 NOTE — Patient Instructions (Signed)
[facility] Discharge Instructions for Patients Receiving Chemotherapy  Today you received the following chemotherapy agents: abraxane  To help prevent nausea and vomiting after your treatment, we encourage you to take your nausea medication.  Take it as often as prescribed.     If you develop nausea and vomiting that is not controlled by your nausea medication, call the clinic. If it is after clinic hours your family physician or the after hours number for the clinic or go to the Emergency Department.   BELOW ARE SYMPTOMS THAT SHOULD BE REPORTED IMMEDIATELY:  *FEVER GREATER THAN 100.5 F  *CHILLS WITH OR WITHOUT FEVER  NAUSEA AND VOMITING THAT IS NOT CONTROLLED WITH YOUR NAUSEA MEDICATION  *UNUSUAL SHORTNESS OF BREATH  *UNUSUAL BRUISING OR BLEEDING  TENDERNESS IN MOUTH AND THROAT WITH OR WITHOUT PRESENCE OF ULCERS  *URINARY PROBLEMS  *BOWEL PROBLEMS  UNUSUAL RASH Items with * indicate a potential emergency and should be followed up as soon as possible.  Feel free to call the clinic you have any questions or concerns. The clinic phone number is [phone].   I have been informed and understand all the instructions given to me. I know to contact the clinic, my physician, or go to the Emergency Department if any problems should occur. I do not have any questions at this time, but understand that I may call the clinic during office hours   should I have any questions or need assistance in obtaining follow up care.    __________________________________________  _____________  __________ Signature of Patient or Authorized Representative            Date                   Time    __________________________________________ Nurse's Signature   

## 2015-03-04 ENCOUNTER — Other Ambulatory Visit: Payer: Self-pay | Admitting: Hematology and Oncology

## 2015-03-04 NOTE — Telephone Encounter (Signed)
Active treatment  

## 2015-03-06 ENCOUNTER — Ambulatory Visit: Payer: 59

## 2015-03-06 ENCOUNTER — Other Ambulatory Visit (HOSPITAL_BASED_OUTPATIENT_CLINIC_OR_DEPARTMENT_OTHER): Payer: 59

## 2015-03-06 ENCOUNTER — Ambulatory Visit (HOSPITAL_COMMUNITY)
Admission: RE | Admit: 2015-03-06 | Discharge: 2015-03-06 | Disposition: A | Discharge status: Home / Self Care | Payer: 59 | Source: Ambulatory Visit | Attending: Hematology and Oncology | Admitting: Hematology and Oncology

## 2015-03-06 ENCOUNTER — Ambulatory Visit (HOSPITAL_BASED_OUTPATIENT_CLINIC_OR_DEPARTMENT_OTHER): Payer: 59

## 2015-03-06 ENCOUNTER — Other Ambulatory Visit: Payer: Self-pay | Admitting: Nurse Practitioner

## 2015-03-06 VITALS — BP 106/60 | HR 94 | Temp 98.3°F | Resp 18

## 2015-03-06 DIAGNOSIS — Z006 Encounter for examination for normal comparison and control in clinical research program: Secondary | ICD-10-CM | POA: Diagnosis not present

## 2015-03-06 DIAGNOSIS — C50511 Malignant neoplasm of lower-outer quadrant of right female breast: Secondary | ICD-10-CM | POA: Diagnosis present

## 2015-03-06 DIAGNOSIS — Z95828 Presence of other vascular implants and grafts: Secondary | ICD-10-CM

## 2015-03-06 LAB — CBC WITH DIFFERENTIAL/PLATELET
BASO%: 0.7 % (ref 0.0–2.0)
BASOS ABS: 0 10*3/uL (ref 0.0–0.1)
EOS%: 1.2 % (ref 0.0–7.0)
Eosinophils Absolute: 0.1 10*3/uL (ref 0.0–0.5)
HEMATOCRIT: 23.7 % — AB (ref 34.8–46.6)
HGB: 7.8 g/dL — ABNORMAL LOW (ref 11.6–15.9)
LYMPH#: 0.2 10*3/uL — AB (ref 0.9–3.3)
LYMPH%: 5.8 % — ABNORMAL LOW (ref 14.0–49.7)
MCH: 30.1 pg (ref 25.1–34.0)
MCHC: 32.9 g/dL (ref 31.5–36.0)
MCV: 91.5 fL (ref 79.5–101.0)
MONO#: 0.4 10*3/uL (ref 0.1–0.9)
MONO%: 8.9 % (ref 0.0–14.0)
NEUT#: 3.5 10*3/uL (ref 1.5–6.5)
NEUT%: 83.4 % — AB (ref 38.4–76.8)
PLATELETS: 235 10*3/uL (ref 145–400)
RBC: 2.59 10*6/uL — ABNORMAL LOW (ref 3.70–5.45)
RDW: 21 % — ABNORMAL HIGH (ref 11.2–14.5)
WBC: 4.2 10*3/uL (ref 3.9–10.3)
nRBC: 1 % — ABNORMAL HIGH (ref 0–0)

## 2015-03-06 LAB — ABO/RH: ABO/RH(D): A POS

## 2015-03-06 LAB — PREPARE RBC (CROSSMATCH)

## 2015-03-06 LAB — HOLD TUBE, BLOOD BANK

## 2015-03-06 MED ORDER — DIPHENHYDRAMINE HCL 25 MG PO CAPS
25.0000 mg | ORAL_CAPSULE | Freq: Once | ORAL | Status: AC
  Administered 2015-03-06: 25 mg via ORAL

## 2015-03-06 MED ORDER — HEPARIN SOD (PORK) LOCK FLUSH 100 UNIT/ML IV SOLN
500.0000 [IU] | Freq: Every day | INTRAVENOUS | Status: AC | PRN
  Administered 2015-03-06: 500 [IU]
  Filled 2015-03-06: qty 5

## 2015-03-06 MED ORDER — ACETAMINOPHEN 325 MG PO TABS
650.0000 mg | ORAL_TABLET | Freq: Once | ORAL | Status: AC
  Administered 2015-03-06: 650 mg via ORAL

## 2015-03-06 MED ORDER — SODIUM CHLORIDE 0.9 % IJ SOLN
10.0000 mL | INTRAMUSCULAR | Status: AC | PRN
  Administered 2015-03-06: 10 mL
  Filled 2015-03-06: qty 10

## 2015-03-06 MED ORDER — DIPHENHYDRAMINE HCL 25 MG PO CAPS
ORAL_CAPSULE | ORAL | Status: AC
  Filled 2015-03-06: qty 1

## 2015-03-06 MED ORDER — ACETAMINOPHEN 325 MG PO TABS
ORAL_TABLET | ORAL | Status: AC
  Filled 2015-03-06: qty 2

## 2015-03-06 MED ORDER — SODIUM CHLORIDE 0.9 % IV SOLN
250.0000 mL | Freq: Once | INTRAVENOUS | Status: AC
  Administered 2015-03-06: 250 mL via INTRAVENOUS

## 2015-03-06 MED ORDER — SODIUM CHLORIDE 0.9 % IJ SOLN
10.0000 mL | INTRAMUSCULAR | Status: DC | PRN
  Administered 2015-03-06: 10 mL via INTRAVENOUS
  Filled 2015-03-06: qty 10

## 2015-03-06 NOTE — Patient Instructions (Signed)

## 2015-03-06 NOTE — Patient Instructions (Signed)

## 2015-03-07 LAB — TYPE AND SCREEN
ABO/RH(D): A POS
ANTIBODY SCREEN: NEGATIVE
UNIT DIVISION: 0
Unit division: 0

## 2015-03-08 ENCOUNTER — Ambulatory Visit: Payer: 59 | Admitting: Nutrition

## 2015-03-08 ENCOUNTER — Other Ambulatory Visit (HOSPITAL_BASED_OUTPATIENT_CLINIC_OR_DEPARTMENT_OTHER): Payer: 59

## 2015-03-08 ENCOUNTER — Ambulatory Visit: Payer: 59

## 2015-03-08 ENCOUNTER — Telehealth: Payer: Self-pay | Admitting: Hematology and Oncology

## 2015-03-08 ENCOUNTER — Encounter: Payer: Self-pay | Admitting: Nurse Practitioner

## 2015-03-08 ENCOUNTER — Other Ambulatory Visit: Payer: 59

## 2015-03-08 ENCOUNTER — Ambulatory Visit (HOSPITAL_BASED_OUTPATIENT_CLINIC_OR_DEPARTMENT_OTHER): Payer: 59 | Admitting: Nurse Practitioner

## 2015-03-08 ENCOUNTER — Ambulatory Visit (HOSPITAL_BASED_OUTPATIENT_CLINIC_OR_DEPARTMENT_OTHER): Payer: 59

## 2015-03-08 VITALS — BP 119/66 | HR 98 | Temp 99.3°F | Resp 18 | Ht 64.0 in | Wt 174.7 lb

## 2015-03-08 DIAGNOSIS — C50511 Malignant neoplasm of lower-outer quadrant of right female breast: Secondary | ICD-10-CM

## 2015-03-08 DIAGNOSIS — C773 Secondary and unspecified malignant neoplasm of axilla and upper limb lymph nodes: Secondary | ICD-10-CM

## 2015-03-08 DIAGNOSIS — Z5111 Encounter for antineoplastic chemotherapy: Secondary | ICD-10-CM

## 2015-03-08 DIAGNOSIS — Z170 Estrogen receptor positive status [ER+]: Secondary | ICD-10-CM | POA: Diagnosis not present

## 2015-03-08 DIAGNOSIS — Z95828 Presence of other vascular implants and grafts: Secondary | ICD-10-CM

## 2015-03-08 LAB — COMPREHENSIVE METABOLIC PANEL (CC13)
ALK PHOS: 80 U/L (ref 40–150)
ALT: 17 U/L (ref 0–55)
AST: 17 U/L (ref 5–34)
Albumin: 3.3 g/dL — ABNORMAL LOW (ref 3.5–5.0)
Anion Gap: 11 mEq/L (ref 3–11)
BILIRUBIN TOTAL: 0.78 mg/dL (ref 0.20–1.20)
BUN: 11.9 mg/dL (ref 7.0–26.0)
CO2: 22 mEq/L (ref 22–29)
Calcium: 9.1 mg/dL (ref 8.4–10.4)
Chloride: 105 mEq/L (ref 98–109)
Creatinine: 0.7 mg/dL (ref 0.6–1.1)
EGFR: 88 mL/min/{1.73_m2} — AB (ref >90)
GLUCOSE: 136 mg/dL (ref 70–140)
Potassium: 3.6 mEq/L (ref 3.5–5.1)
Sodium: 138 mEq/L (ref 136–145)
Total Protein: 6.3 g/dL — ABNORMAL LOW (ref 6.4–8.3)

## 2015-03-08 LAB — CBC WITH DIFFERENTIAL/PLATELET
BASO%: 0.7 % (ref 0.0–2.0)
Basophils Absolute: 0 10*3/uL (ref 0.0–0.1)
EOS%: 1.8 % (ref 0.0–7.0)
Eosinophils Absolute: 0.1 10*3/uL (ref 0.0–0.5)
HCT: 30.6 % — ABNORMAL LOW (ref 34.8–46.6)
HEMOGLOBIN: 10.3 g/dL — AB (ref 11.6–15.9)
LYMPH#: 0.2 10*3/uL — AB (ref 0.9–3.3)
LYMPH%: 3.7 % — ABNORMAL LOW (ref 14.0–49.7)
MCH: 29.9 pg (ref 25.1–34.0)
MCHC: 33.7 g/dL (ref 31.5–36.0)
MCV: 89 fL (ref 79.5–101.0)
MONO#: 0.8 10*3/uL (ref 0.1–0.9)
MONO%: 19.2 % — ABNORMAL HIGH (ref 0.0–14.0)
NEUT%: 74.6 % (ref 38.4–76.8)
NEUTROS ABS: 3.2 10*3/uL (ref 1.5–6.5)
NRBC: 3 % — AB (ref 0–0)
Platelets: 223 10*3/uL (ref 145–400)
RBC: 3.44 10*6/uL — AB (ref 3.70–5.45)
RDW: 19.3 % — ABNORMAL HIGH (ref 11.2–14.5)
WBC: 4.3 10*3/uL (ref 3.9–10.3)

## 2015-03-08 MED ORDER — PALONOSETRON HCL INJECTION 0.25 MG/5ML
0.2500 mg | Freq: Once | INTRAVENOUS | Status: AC
  Administered 2015-03-08: 0.25 mg via INTRAVENOUS

## 2015-03-08 MED ORDER — SODIUM CHLORIDE 0.9 % IJ SOLN
10.0000 mL | INTRAMUSCULAR | Status: DC | PRN
Start: 2015-03-08 — End: 2015-03-08
  Administered 2015-03-08: 10 mL
  Filled 2015-03-08: qty 10

## 2015-03-08 MED ORDER — PACLITAXEL PROTEIN-BOUND CHEMO INJECTION 100 MG
75.0000 mg/m2 | Freq: Once | INTRAVENOUS | Status: AC
  Administered 2015-03-08: 150 mg via INTRAVENOUS
  Filled 2015-03-08: qty 30

## 2015-03-08 MED ORDER — PALONOSETRON HCL INJECTION 0.25 MG/5ML
INTRAVENOUS | Status: AC
  Filled 2015-03-08: qty 5

## 2015-03-08 MED ORDER — SODIUM CHLORIDE 0.9 % IJ SOLN
10.0000 mL | INTRAMUSCULAR | Status: DC | PRN
  Administered 2015-03-08: 10 mL via INTRAVENOUS
  Filled 2015-03-08: qty 10

## 2015-03-08 MED ORDER — HEPARIN SOD (PORK) LOCK FLUSH 100 UNIT/ML IV SOLN
500.0000 [IU] | Freq: Once | INTRAVENOUS | Status: AC | PRN
  Administered 2015-03-08: 500 [IU]
  Filled 2015-03-08: qty 5

## 2015-03-08 MED ORDER — SODIUM CHLORIDE 0.9 % IV SOLN
Freq: Once | INTRAVENOUS | Status: AC
  Administered 2015-03-08: 11:00:00 via INTRAVENOUS

## 2015-03-08 NOTE — Patient Instructions (Signed)
[facility] Discharge Instructions for Patients Receiving Chemotherapy  Today you received the following chemotherapy agents Abraxane  To help prevent nausea and vomiting after your treatment, we encourage you to take your nausea medication as needed   If you develop nausea and vomiting that is not controlled by your nausea medication, call the clinic.   BELOW ARE SYMPTOMS THAT SHOULD BE REPORTED IMMEDIATELY:  *FEVER GREATER THAN 100.5 F  *CHILLS WITH OR WITHOUT FEVER  NAUSEA AND VOMITING THAT IS NOT CONTROLLED WITH YOUR NAUSEA MEDICATION  *UNUSUAL SHORTNESS OF BREATH  *UNUSUAL BRUISING OR BLEEDING  TENDERNESS IN MOUTH AND THROAT WITH OR WITHOUT PRESENCE OF ULCERS  *URINARY PROBLEMS  *BOWEL PROBLEMS  UNUSUAL RASH Items with * indicate a potential emergency and should be followed up as soon as possible.  Feel free to call the clinic you have any questions or concerns. The clinic phone number is [phone].  Please show the CHEMO ALERT CARD at check-in to the Emergency Department and triage nurse.   

## 2015-03-08 NOTE — Patient Instructions (Signed)

## 2015-03-08 NOTE — Telephone Encounter (Signed)
Gave avs & calendar for August °

## 2015-03-08 NOTE — Progress Notes (Signed)
Patient Care Team: Jonathon Jordan, MD as PCP - General (Family Medicine)  DIAGNOSIS: No matching staging information was found for the patient.  SUMMARY OF ONCOLOGIC HISTORY:   Breast cancer of lower-outer quadrant of right female breast   11/06/2008 Surgery Left breast lumpectomy: High-grade DCIS 2.8 cm, 18 lymph nodes negative, ER 98%, PR 81%, stage 0, patient refused antiestrogen therapy   10/18/2014 Initial Biopsy Right breast invasive ductal carcinoma with DCIS grade 3, lymphovascular invasion, ER 100%, PR 12%, HER-2 negative ratio 1.4, Ki-67 51%, T2 N0 M0 stage II a clinical stage   10/24/2014 Breast MRI Right breast irregular enhancing mass 2.9 x 2.6 x 2.4 cm, left breast evidence of prior lumpectomy and left axillary dissection   11/27/2014 Surgery Right lumpectomy: Invasive ductal carcinoma grade 3, 3.2 cm, with high-grade DCIS, LVI present,, 1/1 intramammary lymph node positive, 1 SLN micro-met, stage IIB, T2 N1a   12/28/2014 -  Chemotherapy Adjuvant dose dense Adriamycin and Cytoxan X 4 followed by Abraxane weekly 12    CHIEF COMPLIANT: Cycle 3 Abraxane  INTERVAL HISTORY: Natalie Valdez is a 64 yr old currently on adjuvant chemo with Abraxane. She is here to start cycle 3 of Abraxane. She tolerated cycle 2 well. She returned this Wednesday for labs, because I suspected she would become anemic this week. She was given 2 units of blood for an hgb of 7.8, and now has more energy. Her mood is improved. She denies fevers or chills. She has some mind nausea, managed with compazine. She denies bowel or bladder changes. Her appetite is fine despite taste changes. She had tinging to her left foot last night, but this resolved on its own. Because she is under stress her head tremor is more pronounced. She can stop it if she concentrates.   REVIEW OF SYSTEMS:   Constitutional: Denies fevers, chills or abnormal weight loss Eyes: Denies blurriness of vision Ears, nose, mouth, throat, and face: Denies  mucositis or sore throat Respiratory: Denies cough, dyspnea or wheezes Cardiovascular: Denies palpitation, chest discomfort or lower extremity swelling Gastrointestinal:  Denies nausea or change in bowel habits Skin: Denies abnormal skin rashes Lymphatics: Denies new lymphadenopathy or easy bruising Neurological:Denies numbness, tingling or new weaknesses Behavioral/Psych: Mood is stable, no new changes  All other systems were reviewed with the patient and are negative.  I have reviewed the past medical history, past surgical history, social history and family history with the patient and they are unchanged from previous note.  ALLERGIES:  has No Known Allergies.  MEDICATIONS:  Current Outpatient Prescriptions  Medication Sig Dispense Refill  . Alum & Mag Hydroxide-Simeth (MAGIC MOUTHWASH W/LIDOCAINE) SOLN Take 5 mLs by mouth 3 (three) times daily as needed for mouth pain. (Patient not taking: Reported on 03/01/2015) 60 mL 1  . Atorvastatin Calcium (INVESTIGATIONAL ATORVASTATIN/PLACEBO) 40 MG tablet [facility] [id number] Take 1 tablet by mouth daily. Take 2 doses (these doses must be 12 hours apart) prior to first chemotherapy treatment. Then take 1 tablet daily by mouth with or without food. 180 tablet 0  . celecoxib (CELEBREX) 200 MG capsule Take 200 mg by mouth 1 day or 1 dose.    . Cholecalciferol ([employer] VIT D 5000 HIGH-POTENCY PO) Take by mouth.    . citalopram (CELEXA) 20 MG tablet Take 20 mg by mouth every evening.   11  . loratadine (CLARITIN) 10 MG tablet Take 10 mg by mouth daily.    Marland Kitchen LORazepam (ATIVAN) 0.5 MG tablet TAKE 1 TABLET  BY MOUTH AT BEDTIME 30 tablet 0  . omeprazole (PRILOSEC) 40 MG capsule Take 1 capsule (40 mg total) by mouth daily. 30 capsule 3  . oxyCODONE-acetaminophen (ROXICET) 5-325 MG per tablet Take 1 tablet by mouth every 4 (four) hours as needed. (Patient not taking: Reported on 03/01/2015) 30 tablet 0  . RESTASIS 0.05 % ophthalmic emulsion   9  . UNABLE TO FIND 1  each by Other route continuous as needed. 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 1 each 1  . zolpidem (AMBIEN) 10 MG tablet   5   No current facility-administered medications for this visit.   Facility-Administered Medications Ordered in Other Visits  Medication Dose Route Frequency Provider Last Rate Last Dose  . sodium chloride 0.9 % injection 10 mL  10 mL Intravenous PRN Nicholas Lose, MD   10 mL at 03/08/15 0952    PHYSICAL EXAMINATION: ECOG PERFORMANCE STATUS: 2  GENERAL:alert, no distress and comfortable SKIN: skin color, texture, turgor are normal, no rashes or significant lesions EYES: normal, Conjunctiva are pink and non-injected, sclera clear OROPHARYNX:no exudate, no erythema and lips, buccal mucosa, and tongue normal  NECK: supple, thyroid normal size, non-tender, without nodularity LYMPH:  no palpable lymphadenopathy in the cervical, axillary or inguinal LUNGS: clear to auscultation and percussion with normal breathing effort HEART: regular rate & rhythm and no murmurs and no lower extremity edema ABDOMEN:abdomen soft, non-tender and normal bowel sounds Musculoskeletal:no cyanosis of digits and no clubbing  NEURO: alert & oriented x 3 with fluent speech, no focal motor/sensory deficits  LABORATORY DATA:  I have reviewed the data as listed   Chemistry      Component Value Date/Time   NA 137 03/01/2015 1019   NA 138 11/26/2014 1508   K 3.8 03/01/2015 1019   K 4.6 11/26/2014 1508   CL 103 11/26/2014 1508   CO2 22 03/01/2015 1019   CO2 25 11/26/2014 1508   BUN 7.3 03/01/2015 1019   BUN 17 11/26/2014 1508   CREATININE 0.8 03/01/2015 1019   CREATININE 0.83 11/26/2014 1508      Component Value Date/Time   CALCIUM 9.0 03/01/2015 1019   CALCIUM 9.3 11/26/2014 1508   ALKPHOS 91 03/01/2015 1019   ALKPHOS 90 11/26/2014 1508   AST 21 03/01/2015 1019   AST 17 11/26/2014 1508   ALT 30  03/01/2015 1019   ALT 15 11/26/2014 1508   BILITOT 0.55 03/01/2015 1019   BILITOT 0.3 11/26/2014 1508       Lab Results  Component Value Date   WBC 4.3 03/08/2015   HGB 10.3* 03/08/2015   HCT 30.6* 03/08/2015   MCV 89.0 03/08/2015   PLT 223 03/08/2015   NEUTROABS 3.2 03/08/2015     ASSESSMENT & PLAN:  Breast cancer of lower-outer quadrant of right female breast Right lumpectomy 11/27/2014: Invasive ductal carcinoma grade 3, 3.2 cm, with high-grade DCIS, LVI present,, 1/1 intramammary lymph node positive, 1 SLN micro-met, stage IIB, T2 N1a ER 100%, PR 12%, HER-2 negative, Ki-67 51% Patient is enrolled on PREVENT clinical trial Lipitor versus placebo  Treatment plan: 1. Adjuvant chemotherapy with dose dense Adriamycin and Cytoxan 4 followed by Abraxane weekly 12 2. Adjuvant radiation therapy followed by 3. Anti-estrogen therapy  Current treatment: Adjuvant chemotherapy: Cycle 1/12 Abraxane (decreased dose to 75 mg/m2) Echocardiogram 12/11/2014: EF 60-65%  Chemo toxicities:  1. Nausea: managed with zofran and compazine 2. Constipation: managed with miralax 3. Heartburn: likely  the result of steroids. Prescribed prilosec 1m daily.  4. neulasta bone pain: claritin x 7 days, advil PRN pain  5. Neutropenia: resolved 6. Depression: continue celexa 7. Treatment related anemia: hgb up to 10.3 today after 2 units of blood last week.  [facility] 12/20/[facility] The report had an abnormal reading, identifying a lesion in the right breast. mammogram and Ultrasound: Normal  PREVENT trial: No toxicities to the treatment liver function tests are normal, no myalgias  Return to clinic in 2 weeks with cycle 5/12 Abraxane.  No orders of the defined types were placed in this encounter.   The patient has a good understanding of the overall plan. she agrees with it. she will call with any problems that may develop before the next visit here.   HLaurie Panda  NP

## 2015-03-08 NOTE — Progress Notes (Signed)
64 year old female receiving chemotherapy for breast cancer.  Past medical history includes headaches, arthritis, depression, and postop nausea vomiting.  Medications include Celebrex, vitamin D, Celexa, Ativan and Prilosec.  Labs include glucose 166 and albumin 3.4 on August 5.  Height: 64 inches. Weight: 174.7 pounds. Usual body weight: 185-190 pounds. BMI: 29.97.  Patient reports severe taste alterations including metallic taste and foods tasting like baking soda. Patient has had decreased appetite and inability to eat. Patient also describes nausea with the smell of food. Patient appears to tolerate cold bland foods.  Nutrition diagnosis:  Unintended weight loss related to nausea and taste alterations as evidenced by approximate 10 pound weight loss from usual body weight.  Intervention:  Educated patient and husband on strategies for improving oral intake with taste alterations. Recommended patient rinse mouth prior to eating. Encouraged patient to consume small frequent meals and snacks. Recommended patient try [employer] breakfast essentials and provided coupons. Questions were answered.  Teach back method used.  Contact information was given.  Monitoring, evaluation, goals: Patient will tolerate calories and protein to maintain lean body mass.  Next visit: Friday, August 26 during infusion.  **Disclaimer: This note was dictated with voice recognition software. Similar sounding words can inadvertently be transcribed and this note may contain transcription errors which may not have been corrected upon publication of note.**

## 2015-03-11 ENCOUNTER — Encounter: Payer: Self-pay | Admitting: *Deleted

## 2015-03-15 ENCOUNTER — Ambulatory Visit (HOSPITAL_BASED_OUTPATIENT_CLINIC_OR_DEPARTMENT_OTHER): Payer: 59

## 2015-03-15 ENCOUNTER — Other Ambulatory Visit (HOSPITAL_BASED_OUTPATIENT_CLINIC_OR_DEPARTMENT_OTHER): Payer: 59

## 2015-03-15 ENCOUNTER — Ambulatory Visit: Payer: 59

## 2015-03-15 VITALS — BP 121/65 | HR 96 | Temp 97.4°F | Resp 18

## 2015-03-15 DIAGNOSIS — C50511 Malignant neoplasm of lower-outer quadrant of right female breast: Secondary | ICD-10-CM

## 2015-03-15 DIAGNOSIS — Z95828 Presence of other vascular implants and grafts: Secondary | ICD-10-CM

## 2015-03-15 DIAGNOSIS — Z5111 Encounter for antineoplastic chemotherapy: Secondary | ICD-10-CM

## 2015-03-15 LAB — COMPREHENSIVE METABOLIC PANEL (CC13)
ALBUMIN: 3.3 g/dL — AB (ref 3.5–5.0)
ALK PHOS: 79 U/L (ref 40–150)
ALT: 22 U/L (ref 0–55)
AST: 18 U/L (ref 5–34)
Anion Gap: 11 mEq/L (ref 3–11)
BILIRUBIN TOTAL: 0.64 mg/dL (ref 0.20–1.20)
BUN: 13.2 mg/dL (ref 7.0–26.0)
CALCIUM: 9.7 mg/dL (ref 8.4–10.4)
CO2: 22 mEq/L (ref 22–29)
CREATININE: 0.7 mg/dL (ref 0.6–1.1)
Chloride: 106 mEq/L (ref 98–109)
EGFR: >90 mL/min/{1.73_m2} (ref >90)
GLUCOSE: 108 mg/dL (ref 70–140)
POTASSIUM: 3.7 meq/L (ref 3.5–5.1)
SODIUM: 138 meq/L (ref 136–145)
TOTAL PROTEIN: 6.4 g/dL (ref 6.4–8.3)

## 2015-03-15 LAB — CBC WITH DIFFERENTIAL/PLATELET
BASO%: 0.5 % (ref 0.0–2.0)
BASOS ABS: 0 10*3/uL (ref 0.0–0.1)
EOS ABS: 0.1 10*3/uL (ref 0.0–0.5)
EOS%: 2.9 % (ref 0.0–7.0)
HEMATOCRIT: 30.6 % — AB (ref 34.8–46.6)
HEMOGLOBIN: 10.2 g/dL — AB (ref 11.6–15.9)
LYMPH#: 0.2 10*3/uL — AB (ref 0.9–3.3)
LYMPH%: 5 % — ABNORMAL LOW (ref 14.0–49.7)
MCH: 30.4 pg (ref 25.1–34.0)
MCHC: 33.3 g/dL (ref 31.5–36.0)
MCV: 91.3 fL (ref 79.5–101.0)
MONO#: 0.7 10*3/uL (ref 0.1–0.9)
MONO%: 18 % — ABNORMAL HIGH (ref 0.0–14.0)
NEUT%: 73.6 % (ref 38.4–76.8)
NEUTROS ABS: 2.8 10*3/uL (ref 1.5–6.5)
Platelets: 271 10*3/uL (ref 145–400)
RBC: 3.35 10*6/uL — ABNORMAL LOW (ref 3.70–5.45)
RDW: 19.2 % — AB (ref 11.2–14.5)
WBC: 3.8 10*3/uL — AB (ref 3.9–10.3)

## 2015-03-15 MED ORDER — SODIUM CHLORIDE 0.9 % IV SOLN
Freq: Once | INTRAVENOUS | Status: AC
  Administered 2015-03-15: 10:00:00 via INTRAVENOUS

## 2015-03-15 MED ORDER — PALONOSETRON HCL INJECTION 0.25 MG/5ML
INTRAVENOUS | Status: AC
  Filled 2015-03-15: qty 5

## 2015-03-15 MED ORDER — PACLITAXEL PROTEIN-BOUND CHEMO INJECTION 100 MG
75.0000 mg/m2 | Freq: Once | INTRAVENOUS | Status: AC
  Administered 2015-03-15: 150 mg via INTRAVENOUS
  Filled 2015-03-15: qty 30

## 2015-03-15 MED ORDER — PALONOSETRON HCL INJECTION 0.25 MG/5ML
0.2500 mg | Freq: Once | INTRAVENOUS | Status: AC
  Administered 2015-03-15: 0.25 mg via INTRAVENOUS

## 2015-03-15 MED ORDER — HEPARIN SOD (PORK) LOCK FLUSH 100 UNIT/ML IV SOLN
500.0000 [IU] | Freq: Once | INTRAVENOUS | Status: AC | PRN
  Administered 2015-03-15: 500 [IU]
  Filled 2015-03-15: qty 5

## 2015-03-15 MED ORDER — SODIUM CHLORIDE 0.9 % IJ SOLN
10.0000 mL | INTRAMUSCULAR | Status: DC | PRN
  Administered 2015-03-15: 10 mL
  Filled 2015-03-15: qty 10

## 2015-03-15 MED ORDER — SODIUM CHLORIDE 0.9 % IJ SOLN
10.0000 mL | INTRAMUSCULAR | Status: DC | PRN
  Administered 2015-03-15: 10 mL via INTRAVENOUS
  Filled 2015-03-15: qty 10

## 2015-03-15 NOTE — Patient Instructions (Signed)

## 2015-03-15 NOTE — Patient Instructions (Signed)
[facility] Discharge Instructions for Patients Receiving Chemotherapy  Today you received the following chemotherapy agents Abraxane  To help prevent nausea and vomiting after your treatment, we encourage you to take your nausea medication as needed   If you develop nausea and vomiting that is not controlled by your nausea medication, call the clinic.   BELOW ARE SYMPTOMS THAT SHOULD BE REPORTED IMMEDIATELY:  *FEVER GREATER THAN 100.5 F  *CHILLS WITH OR WITHOUT FEVER  NAUSEA AND VOMITING THAT IS NOT CONTROLLED WITH YOUR NAUSEA MEDICATION  *UNUSUAL SHORTNESS OF BREATH  *UNUSUAL BRUISING OR BLEEDING  TENDERNESS IN MOUTH AND THROAT WITH OR WITHOUT PRESENCE OF ULCERS  *URINARY PROBLEMS  *BOWEL PROBLEMS  UNUSUAL RASH Items with * indicate a potential emergency and should be followed up as soon as possible.  Feel free to call the clinic you have any questions or concerns. The clinic phone number is [phone].  Please show the CHEMO ALERT CARD at check-in to the Emergency Department and triage nurse.   

## 2015-03-21 ENCOUNTER — Other Ambulatory Visit: Payer: Self-pay

## 2015-03-21 DIAGNOSIS — C50511 Malignant neoplasm of lower-outer quadrant of right female breast: Secondary | ICD-10-CM

## 2015-03-22 ENCOUNTER — Ambulatory Visit (HOSPITAL_BASED_OUTPATIENT_CLINIC_OR_DEPARTMENT_OTHER): Payer: 59 | Admitting: Hematology and Oncology

## 2015-03-22 ENCOUNTER — Ambulatory Visit (HOSPITAL_BASED_OUTPATIENT_CLINIC_OR_DEPARTMENT_OTHER): Payer: 59

## 2015-03-22 ENCOUNTER — Encounter: Payer: Self-pay | Admitting: Hematology and Oncology

## 2015-03-22 ENCOUNTER — Ambulatory Visit: Payer: 59

## 2015-03-22 ENCOUNTER — Telehealth: Payer: Self-pay | Admitting: Hematology and Oncology

## 2015-03-22 ENCOUNTER — Ambulatory Visit: Payer: 59 | Admitting: Nutrition

## 2015-03-22 ENCOUNTER — Other Ambulatory Visit (HOSPITAL_BASED_OUTPATIENT_CLINIC_OR_DEPARTMENT_OTHER): Payer: 59

## 2015-03-22 VITALS — BP 113/59 | HR 100 | Temp 99.5°F | Resp 18 | Ht 64.0 in | Wt 172.2 lb

## 2015-03-22 DIAGNOSIS — D701 Agranulocytosis secondary to cancer chemotherapy: Secondary | ICD-10-CM

## 2015-03-22 DIAGNOSIS — Z95828 Presence of other vascular implants and grafts: Secondary | ICD-10-CM

## 2015-03-22 DIAGNOSIS — T451X5A Adverse effect of antineoplastic and immunosuppressive drugs, initial encounter: Secondary | ICD-10-CM

## 2015-03-22 DIAGNOSIS — D6481 Anemia due to antineoplastic chemotherapy: Secondary | ICD-10-CM

## 2015-03-22 DIAGNOSIS — C50511 Malignant neoplasm of lower-outer quadrant of right female breast: Secondary | ICD-10-CM

## 2015-03-22 DIAGNOSIS — Z5111 Encounter for antineoplastic chemotherapy: Secondary | ICD-10-CM

## 2015-03-22 DIAGNOSIS — Z170 Estrogen receptor positive status [ER+]: Secondary | ICD-10-CM

## 2015-03-22 LAB — COMPREHENSIVE METABOLIC PANEL (CC13)
ALBUMIN: 3.1 g/dL — AB (ref 3.5–5.0)
ALK PHOS: 75 U/L (ref 40–150)
ALT: 30 U/L (ref 0–55)
AST: 21 U/L (ref 5–34)
Anion Gap: 10 mEq/L (ref 3–11)
BILIRUBIN TOTAL: 0.52 mg/dL (ref 0.20–1.20)
BUN: 11.4 mg/dL (ref 7.0–26.0)
CALCIUM: 9.3 mg/dL (ref 8.4–10.4)
CO2: 23 mEq/L (ref 22–29)
Chloride: 103 mEq/L (ref 98–109)
Creatinine: 0.7 mg/dL (ref 0.6–1.1)
GLUCOSE: 127 mg/dL (ref 70–140)
POTASSIUM: 3.9 meq/L (ref 3.5–5.1)
SODIUM: 136 meq/L (ref 136–145)
TOTAL PROTEIN: 6 g/dL — AB (ref 6.4–8.3)

## 2015-03-22 LAB — CBC WITH DIFFERENTIAL/PLATELET
BASO%: 0.5 % (ref 0.0–2.0)
Basophils Absolute: 0 10*3/uL (ref 0.0–0.1)
EOS%: 1.1 % (ref 0.0–7.0)
Eosinophils Absolute: 0.1 10*3/uL (ref 0.0–0.5)
HCT: 28.5 % — ABNORMAL LOW (ref 34.8–46.6)
HGB: 9.5 g/dL — ABNORMAL LOW (ref 11.6–15.9)
LYMPH%: 3.8 % — AB (ref 14.0–49.7)
MCH: 30.5 pg (ref 25.1–34.0)
MCHC: 33.2 g/dL (ref 31.5–36.0)
MCV: 91.7 fL (ref 79.5–101.0)
MONO#: 0.7 10*3/uL (ref 0.1–0.9)
MONO%: 10.1 % (ref 0.0–14.0)
NEUT%: 84.5 % — AB (ref 38.4–76.8)
NEUTROS ABS: 5.6 10*3/uL (ref 1.5–6.5)
PLATELETS: 271 10*3/uL (ref 145–400)
RBC: 3.11 10*6/uL — AB (ref 3.70–5.45)
RDW: 20.5 % — ABNORMAL HIGH (ref 11.2–14.5)
WBC: 6.7 10*3/uL (ref 3.9–10.3)
lymph#: 0.3 10*3/uL — ABNORMAL LOW (ref 0.9–3.3)

## 2015-03-22 MED ORDER — SODIUM CHLORIDE 0.9 % IJ SOLN
10.0000 mL | INTRAMUSCULAR | Status: DC | PRN
  Administered 2015-03-22: 10 mL via INTRAVENOUS
  Filled 2015-03-22: qty 10

## 2015-03-22 MED ORDER — SODIUM CHLORIDE 0.9 % IJ SOLN
10.0000 mL | INTRAMUSCULAR | Status: DC | PRN
  Administered 2015-03-22: 10 mL
  Filled 2015-03-22: qty 10

## 2015-03-22 MED ORDER — PALONOSETRON HCL INJECTION 0.25 MG/5ML
0.2500 mg | Freq: Once | INTRAVENOUS | Status: AC
  Administered 2015-03-22: 0.25 mg via INTRAVENOUS

## 2015-03-22 MED ORDER — PALONOSETRON HCL INJECTION 0.25 MG/5ML
INTRAVENOUS | Status: AC
  Filled 2015-03-22: qty 5

## 2015-03-22 MED ORDER — HEPARIN SOD (PORK) LOCK FLUSH 100 UNIT/ML IV SOLN
500.0000 [IU] | Freq: Once | INTRAVENOUS | Status: AC | PRN
  Administered 2015-03-22: 500 [IU]
  Filled 2015-03-22: qty 5

## 2015-03-22 MED ORDER — PACLITAXEL PROTEIN-BOUND CHEMO INJECTION 100 MG
75.0000 mg/m2 | Freq: Once | INTRAVENOUS | Status: AC
  Administered 2015-03-22: 150 mg via INTRAVENOUS
  Filled 2015-03-22: qty 30

## 2015-03-22 MED ORDER — SODIUM CHLORIDE 0.9 % IV SOLN
Freq: Once | INTRAVENOUS | Status: AC
  Administered 2015-03-22: 14:00:00 via INTRAVENOUS

## 2015-03-22 NOTE — Patient Instructions (Signed)

## 2015-03-22 NOTE — Telephone Encounter (Signed)
Appointments made and avs printed for patient °

## 2015-03-22 NOTE — Progress Notes (Signed)
Brief follow up completed with patient. Reports appetite is good and taste alterations improved. She is eating better now that in a "long time". Has been consuming CIB for additional calories and protein. Albumin of 3.1 is not a good indicator of nutritional status secondary to inflammatory stress response and 21 day half life.  Nutrition diagnosis:  Unintended weight loss continues.  Intervention: Continue strategies for increased calories and protein to meet needs for maintenance of lean body mass. Continue CIB. Continue to manage strategies for nutrition impact symptoms. Teach back method used.  Monitoring, evaluation, and goals: Patient will tolerate adequate calories and protein to meet needs to maintain lean body mass.  Next visit: Patient will contact me if she develops questions or concerns.  **Disclaimer: This note was dictated with voice recognition software. Similar sounding words can inadvertently be transcribed and this note may contain transcription errors which may not have been corrected upon publication of note.**

## 2015-03-22 NOTE — Patient Instructions (Signed)
[facility] Discharge Instructions for Patients Receiving Chemotherapy  Today you received the following chemotherapy agents Abraxane  To help prevent nausea and vomiting after your treatment, we encourage you to take your nausea medication as needed   If you develop nausea and vomiting that is not controlled by your nausea medication, call the clinic.   BELOW ARE SYMPTOMS THAT SHOULD BE REPORTED IMMEDIATELY:  *FEVER GREATER THAN 100.5 F  *CHILLS WITH OR WITHOUT FEVER  NAUSEA AND VOMITING THAT IS NOT CONTROLLED WITH YOUR NAUSEA MEDICATION  *UNUSUAL SHORTNESS OF BREATH  *UNUSUAL BRUISING OR BLEEDING  TENDERNESS IN MOUTH AND THROAT WITH OR WITHOUT PRESENCE OF ULCERS  *URINARY PROBLEMS  *BOWEL PROBLEMS  UNUSUAL RASH Items with * indicate a potential emergency and should be followed up as soon as possible.  Feel free to call the clinic you have any questions or concerns. The clinic phone number is [phone].  Please show the CHEMO ALERT CARD at check-in to the Emergency Department and triage nurse.   

## 2015-03-22 NOTE — Progress Notes (Signed)
Patient Care Team: Jonathon Jordan, MD as PCP - General (Family Medicine)  DIAGNOSIS: No matching staging information was found for the patient.  SUMMARY OF ONCOLOGIC HISTORY:   Breast cancer of lower-outer quadrant of right female breast   11/06/2008 Surgery Left breast lumpectomy: High-grade DCIS 2.8 cm, 18 lymph nodes negative, ER 98%, PR 81%, stage 0, patient refused antiestrogen therapy   10/18/2014 Initial Biopsy Right breast invasive ductal carcinoma with DCIS grade 3, lymphovascular invasion, ER 100%, PR 12%, HER-2 negative ratio 1.4, Ki-67 51%, T2 N0 M0 stage II a clinical stage   10/24/2014 Breast MRI Right breast irregular enhancing mass 2.9 x 2.6 x 2.4 cm, left breast evidence of prior lumpectomy and left axillary dissection   11/27/2014 Surgery Right lumpectomy: Invasive ductal carcinoma grade 3, 3.2 cm, with high-grade DCIS, LVI present,, 1/1 intramammary lymph node positive, 1 SLN micro-met, stage IIB, T2 N1a   12/28/2014 -  Chemotherapy Adjuvant dose dense Adriamycin and Cytoxan X 4 followed by Abraxane weekly 12    CHIEF COMPLIANT:  Abraxane cycle 5/12  INTERVAL HISTORY: Natalie Valdez is a  64 year old with above-mentioned history of left breast cancer currently on adjuvant chemotherapy. She completed Adriamycin and Cytoxan and is currently in Abraxane. Today cycle 5. She reports occasionally she gets a temperature right after the chemotherapy that last 1-2 days. She does not feel sick and she gets better after resting. Denies any nausea vomiting. Denies any neuropathy.  REVIEW OF SYSTEMS:   Constitutional: Denies fevers, chills or abnormal weight loss Eyes: Denies blurriness of vision Ears, nose, mouth, throat, and face: Denies mucositis or sore throat Respiratory: Denies cough, dyspnea or wheezes Cardiovascular: Denies palpitation, chest discomfort or lower extremity swelling Gastrointestinal:  Denies nausea, heartburn or change in bowel habits Skin: Denies abnormal skin  rashes Lymphatics: Denies new lymphadenopathy or easy bruising Neurological:Denies numbness, tingling or new weaknesses Behavioral/Psych: Mood is stable, no new changes  Breast:  denies any pain or lumps or nodules in either breasts All other systems were reviewed with the patient and are negative.  I have reviewed the past medical history, past surgical history, social history and family history with the patient and they are unchanged from previous note.  ALLERGIES:  has No Known Allergies.  MEDICATIONS:  Current Outpatient Prescriptions  Medication Sig Dispense Refill  . Alum & Mag Hydroxide-Simeth (MAGIC MOUTHWASH W/LIDOCAINE) SOLN Take 5 mLs by mouth 3 (three) times daily as needed for mouth pain. (Patient not taking: Reported on 03/01/2015) 60 mL 1  . Atorvastatin Calcium (INVESTIGATIONAL ATORVASTATIN/PLACEBO) 40 MG tablet [facility] [id number] Take 1 tablet by mouth daily. Take 2 doses (these doses must be 12 hours apart) prior to first chemotherapy treatment. Then take 1 tablet daily by mouth with or without food. 180 tablet 0  . celecoxib (CELEBREX) 200 MG capsule Take 200 mg by mouth 1 day or 1 dose.    . Cholecalciferol ([employer] VIT D 5000 HIGH-POTENCY PO) Take by mouth.    . citalopram (CELEXA) 20 MG tablet Take 20 mg by mouth every evening.   11  . lidocaine-prilocaine (EMLA) cream APP EXT AA 1 TIME  3  . loratadine (CLARITIN) 10 MG tablet Take 10 mg by mouth daily.    Marland Kitchen LORazepam (ATIVAN) 0.5 MG tablet TAKE 1 TABLET BY MOUTH AT BEDTIME 30 tablet 0  . omeprazole (PRILOSEC) 40 MG capsule Take 1 capsule (40 mg total) by mouth daily. 30 capsule 3  . oxyCODONE-acetaminophen (ROXICET) 5-325 MG per tablet  Take 1 tablet by mouth every 4 (four) hours as needed. (Patient not taking: Reported on 03/01/2015) 30 tablet 0  . RESTASIS 0.05 % ophthalmic emulsion   9  . UNABLE TO FIND 1 each by Other route continuous as needed. Dispense per medical necessity cranial prosthesis due to alopecia induced by  chemotherapy for breast cancer diagnosis. 1 each 1  . zolpidem (AMBIEN) 10 MG tablet   5   No current facility-administered medications for this visit.    PHYSICAL EXAMINATION: ECOG PERFORMANCE STATUS: 1 - Symptomatic but completely ambulatory  Filed Vitals:   03/22/15 1226  BP: 113/59  Pulse: 100  Temp: 99.5 F (37.5 C)  Resp: 18   Filed Weights   03/22/15 1226  Weight: 172 lb 3.2 oz (78.109 kg)    GENERAL:alert, no distress and comfortable SKIN: skin color, texture, turgor are normal, no rashes or significant lesions EYES: normal, Conjunctiva are pink and non-injected, sclera clear OROPHARYNX:no exudate, no erythema and lips, buccal mucosa, and tongue normal  NECK: supple, thyroid normal size, non-tender, without nodularity LYMPH:  no palpable lymphadenopathy in the cervical, axillary or inguinal LUNGS: clear to auscultation and percussion with normal breathing effort HEART: regular rate & rhythm and no murmurs and no lower extremity edema ABDOMEN:abdomen soft, non-tender and normal bowel sounds Musculoskeletal:no cyanosis of digits and no clubbing  NEURO: alert & oriented x 3 with fluent speech, no focal motor/sensory deficits  LABORATORY DATA:  I have reviewed the data as listed   Chemistry      Component Value Date/Time   NA 136 03/22/2015 1202   NA 138 11/26/2014 1508   K 3.9 03/22/2015 1202   K 4.6 11/26/2014 1508   CL 103 11/26/2014 1508   CO2 23 03/22/2015 1202   CO2 25 11/26/2014 1508   BUN 11.4 03/22/2015 1202   BUN 17 11/26/2014 1508   CREATININE 0.7 03/22/2015 1202   CREATININE 0.83 11/26/2014 1508      Component Value Date/Time   CALCIUM 9.3 03/22/2015 1202   CALCIUM 9.3 11/26/2014 1508   ALKPHOS 75 03/22/2015 1202   ALKPHOS 90 11/26/2014 1508   AST 21 03/22/2015 1202   AST 17 11/26/2014 1508   ALT 30 03/22/2015 1202   ALT 15 11/26/2014 1508   BILITOT 0.52 03/22/2015 1202   BILITOT 0.3 11/26/2014 1508       Lab Results  Component Value  Date   WBC 6.7 03/22/2015   HGB 9.5* 03/22/2015   HCT 28.5* 03/22/2015   MCV 91.7 03/22/2015   PLT 271 03/22/2015   NEUTROABS 5.6 03/22/2015   ASSESSMENT & PLAN:  Right lumpectomy 11/27/2014: Invasive ductal carcinoma grade 3, 3.2 cm, with high-grade DCIS, LVI present,, 1/1 intramammary lymph node positive, 1 SLN micro-met, stage IIB, T2 N1a ER 100%, PR 12%, HER-2 negative, Ki-67 51% Patient is enrolled on PREVENT clinical trial Lipitor versus placebo  Treatment plan: 1. Adjuvant chemotherapy with dose dense Adriamycin and Cytoxan 4 followed by Abraxane weekly 12 2. Adjuvant radiation therapy followed by 3. Anti-estrogen therapy  Current treatment: Adjuvant chemotherapy: Cycle 5/12 Abraxane (decreased dose to 75 mg/m2) Echocardiogram 12/11/2014: EF 60-65%  Chemo toxicities:  1. Nausea: managed with zofran and compazine 2. Constipation: managed with miralax 3. Heartburn: likely the result of steroids. Prescribed prilosec 34m daily.  4. neulasta bone pain: claritin x 7 days, advil PRN pain  5. Neutropenia: resolved 6. Depression: continue celexa 7. Treatment related anemia: hgb up to 10.3 today after 2 units of blood  last week.  Heart [facility] done on 12/20/14 at [facility]. The report had an abnormal reading, identifying a lesion in the right breast. mammogram and Ultrasound: Normal  PREVENT trial: No toxicities to the treatment liver function tests are normal, no myalgias  Return to clinic in 2 weeks with cycle 7/12 Abraxane.  No orders of the defined types were placed in this encounter.   The patient has a good understanding of the overall plan. she agrees with it. she will call with any problems that may develop before the next visit here.   Rulon Eisenmenger, MD

## 2015-03-27 ENCOUNTER — Encounter (HOSPITAL_COMMUNITY): Payer: Self-pay

## 2015-03-27 ENCOUNTER — Emergency Department (HOSPITAL_COMMUNITY)
Admission: EM | Admit: 2015-03-27 | Discharge: 2015-03-27 | Disposition: A | Discharge status: Home / Self Care | Payer: 59 | Attending: Emergency Medicine | Admitting: Emergency Medicine

## 2015-03-27 ENCOUNTER — Telehealth: Payer: Self-pay | Admitting: *Deleted

## 2015-03-27 DIAGNOSIS — Z23 Encounter for immunization: Secondary | ICD-10-CM | POA: Diagnosis not present

## 2015-03-27 DIAGNOSIS — Z79899 Other long term (current) drug therapy: Secondary | ICD-10-CM | POA: Diagnosis not present

## 2015-03-27 DIAGNOSIS — W19XXXA Unspecified fall, initial encounter: Secondary | ICD-10-CM

## 2015-03-27 DIAGNOSIS — R011 Cardiac murmur, unspecified: Secondary | ICD-10-CM | POA: Diagnosis not present

## 2015-03-27 DIAGNOSIS — Z853 Personal history of malignant neoplasm of breast: Secondary | ICD-10-CM | POA: Insufficient documentation

## 2015-03-27 DIAGNOSIS — S40812A Abrasion of left upper arm, initial encounter: Secondary | ICD-10-CM | POA: Insufficient documentation

## 2015-03-27 DIAGNOSIS — Z791 Long term (current) use of non-steroidal anti-inflammatories (NSAID): Secondary | ICD-10-CM | POA: Diagnosis not present

## 2015-03-27 DIAGNOSIS — Y998 Other external cause status: Secondary | ICD-10-CM | POA: Insufficient documentation

## 2015-03-27 DIAGNOSIS — S01511A Laceration without foreign body of lip, initial encounter: Secondary | ICD-10-CM | POA: Diagnosis not present

## 2015-03-27 DIAGNOSIS — M1990 Unspecified osteoarthritis, unspecified site: Secondary | ICD-10-CM | POA: Insufficient documentation

## 2015-03-27 DIAGNOSIS — Y9389 Activity, other specified: Secondary | ICD-10-CM | POA: Insufficient documentation

## 2015-03-27 DIAGNOSIS — W01198A Fall on same level from slipping, tripping and stumbling with subsequent striking against other object, initial encounter: Secondary | ICD-10-CM | POA: Insufficient documentation

## 2015-03-27 DIAGNOSIS — Y92219 Unspecified school as the place of occurrence of the external cause: Secondary | ICD-10-CM | POA: Insufficient documentation

## 2015-03-27 DIAGNOSIS — S0993XA Unspecified injury of face, initial encounter: Secondary | ICD-10-CM | POA: Diagnosis present

## 2015-03-27 DIAGNOSIS — L089 Local infection of the skin and subcutaneous tissue, unspecified: Secondary | ICD-10-CM

## 2015-03-27 DIAGNOSIS — S0081XA Abrasion of other part of head, initial encounter: Secondary | ICD-10-CM

## 2015-03-27 HISTORY — DX: Malignant neoplasm of unspecified site of unspecified female breast: C50.919

## 2015-03-27 MED ORDER — TETANUS-DIPHTH-ACELL PERTUSSIS 5-2.5-18.5 LF-MCG/0.5 IM SUSP
0.5000 mL | Freq: Once | INTRAMUSCULAR | Status: AC
  Administered 2015-03-27: 0.5 mL via INTRAMUSCULAR
  Filled 2015-03-27: qty 0.5

## 2015-03-27 NOTE — Discharge Instructions (Signed)
Please have tooth rechecked by your dentist.  Use saline mouth rinse four times per day and after any oral intake.  Return for signs of infection such as increased pain, redness, or pus.  Return if any signs of head injury- headache, weakness, vision change.

## 2015-03-27 NOTE — ED Provider Notes (Signed)
CSN: [id number]     Arrival date & time 03/27/15  1044 History   First MD Initiated Contact with Patient 03/27/15 1105     Chief Complaint  Patient presents with  . Fall  . Facial Injury     (Consider location/radiation/quality/duration/timing/severity/associated sxs/prior Treatment) HPI 64 year old female who is being treated for breast cancer presents today after trip and fall at her son's school. She states that she tripped on a curb and fell to the ground striking her face on the pavement. She has some abrasion to the left upper lip. She felt like her tooth had some tenderness. Her bite appears normal. She denies any loss of consciousness is not on any blood thinners. She denies any pain besides abrasion on her face and abrasion to her left upper arm. Past Medical History  Diagnosis Date  . Breast cancer, left breast 11/06/2008    breast  . Generalized headaches   . Arthritis   . Breast cancer, right breast 10/18/2014  . PONV (postoperative nausea and vomiting)     needs scop patch  . Heart murmur     dx 2005-never had echo  . Breast cancer    Past Surgical History  Procedure Laterality Date  . Cesarean section    . Dilation and curettage of uterus  1999  . Breast lumpectomy  2010    lumpectomy-lt-snbx  . Breast lumpectomy  2010    lt re-excision br  . Abdominal hysterectomy  2004    BSO  . Breast enhancement surgery  2010  . Breast lumpectomy with radioactive seed and sentinel lymph node biopsy Right 11/27/2014    Procedure: BREAST LUMPECTOMY WITH RADIOACTIVE SEED AND SENTINEL LYMPH NODE MAPPING;  Surgeon: Erroll Luna, MD;  Location: [place];  Service: General;  Laterality: Right;  . Portacath placement Right 12/18/2014    Procedure: INSERTION PORT-A-CATH WITH [place] AND FLOURO;  Surgeon: Erroll Luna, MD;  Location: [place];  Service: General;  Laterality: Right;   Family History  Problem Relation Age of Onset  . Breast cancer Mother     . Ovarian cancer Paternal Grandmother    Social History  Substance Use Topics  . Smoking status: Never Smoker   . Smokeless tobacco: Never Used  . Alcohol Use: Yes     Comment: very little - monthly   OB History    No data available     Review of Systems  All other systems reviewed and are negative.     Allergies  Review of patient's allergies indicates no known allergies.  Home Medications   Prior to Admission medications   Medication Sig Start Date End Date Taking? Authorizing Provider  Alum & Mag Hydroxide-Simeth (MAGIC MOUTHWASH W/LIDOCAINE) SOLN Take 5 mLs by mouth 3 (three) times daily as needed for mouth pain. 01/25/15  Yes Nicholas Lose, MD  Atorvastatin Calcium (INVESTIGATIONAL ATORVASTATIN/PLACEBO) 40 MG tablet [facility] [phone] Take 1 tablet by mouth daily. Take 2 doses (these doses must be 12 hours apart) prior to first chemotherapy treatment. Then take 1 tablet daily by mouth with or without food. 12/20/14  Yes Nicholas Lose, MD  celecoxib (CELEBREX) 200 MG capsule Take 200 mg by mouth 1 day or 1 dose.   Yes Historical Provider, MD  Cholecalciferol ([employer] VIT D 5000 HIGH-POTENCY PO) Take by mouth.   Yes Historical Provider, MD  citalopram (CELEXA) 20 MG tablet Take 20 mg by mouth every evening.  10/25/14  Yes Historical Provider, MD  Cyanocobalamin (B-12 PO) Take 1 tablet by mouth daily at 8 pm.   Yes Historical Provider, MD  lidocaine-prilocaine (EMLA) cream APP EXT AA 1 TIME 02/08/15  Yes Historical Provider, MD  LORazepam (ATIVAN) 0.5 MG tablet TAKE 1 TABLET BY MOUTH AT BEDTIME 03/04/15  Yes Nicholas Lose, MD  omeprazole (PRILOSEC) 40 MG capsule Take 1 capsule (40 mg total) by mouth daily. 01/04/15  Yes Laurie Panda, NP  prochlorperazine (COMPAZINE) 10 MG tablet Take 10 mg by mouth at bedtime. 03/04/15  Yes Historical Provider, MD  RESTASIS 0.05 % ophthalmic emulsion Place into both eyes 2 (two) times daily.  10/26/14  Yes Historical Provider, MD  zolpidem (AMBIEN) 10 MG  tablet  10/25/14  Yes Historical Provider, MD  loratadine (CLARITIN) 10 MG tablet Take 10 mg by mouth daily.    Historical Provider, MD  UNABLE TO FIND 1 each by Other route continuous as needed. 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 12/03/14   Nicholas Lose, MD   BP 122/73 mmHg  Pulse 102  Temp(Src) 98.3 F (36.8 C) (Oral)  Resp 16  SpO2 98%  LMP 11/07/2004 Physical Exam  Constitutional: She is oriented to person, place, and time. She appears well-developed and well-nourished.  HENT:  Head: Normocephalic.    Right Ear: External ear normal.  Left Ear: External ear normal.  Abrasion left upper lip. On the mucosa there is a 1 cm laceration. Tooth appears normal. There is no tenderness in the gumline. No tenderness palpation.  Eyes: Conjunctivae are normal. Pupils are equal, round, and reactive to light.  Neck: Normal range of motion. Neck supple.  Cardiovascular: Normal rate and regular rhythm.   Pulmonary/Chest: Effort normal and breath sounds normal.  Abdominal: Soft. Bowel sounds are normal.  Musculoskeletal:  Abrasion left upper arm with full active range of motion of shoulder and elbow and no crepitus or bony tenderness noted.  Neurological: She is alert and oriented to person, place, and time.  Skin: Skin is warm and dry.  Psychiatric: She has a normal mood and affect.  Nursing note and vitals reviewed.   ED Course  Procedures (including critical care time) Labs Review Labs Reviewed - No data to display  Imaging Review No results found. I have personally reviewed and evaluated these images and lab results as part of my medical decision-making.   EKG Interpretation None      MDM   Final diagnoses:  Lip laceration, initial encounter  Facial abrasion, initial encounter  Upper arm abrasion, infected, left, initial encounter  Fall, initial encounter       Pattricia Boss, MD 03/27/15  1701

## 2015-03-27 NOTE — ED Notes (Signed)
Patient tripped on the curb and fell on concrete. Patient has an abrasion to the face/mouth area and left shoulder. Patient is a cancer patient.

## 2015-03-27 NOTE — Telephone Encounter (Signed)
Received emergency page from operator to call pt ASAP.  Spoke with pt and was informed that pt fell at son's school this am.   Pt stated she " busted my lips pretty bad, and had scraped bruises on legs ".  Husband was concerned about infection.  Pt is under active chemotherapy.  Asked if pt hit her head, pt stated " Yes , a little ". Instructed pt to have husband take pt to ER now for further evaluation.  Pt voiced understanding.

## 2015-03-29 ENCOUNTER — Ambulatory Visit: Payer: 59

## 2015-03-29 ENCOUNTER — Other Ambulatory Visit (HOSPITAL_BASED_OUTPATIENT_CLINIC_OR_DEPARTMENT_OTHER): Payer: 59

## 2015-03-29 ENCOUNTER — Ambulatory Visit (HOSPITAL_BASED_OUTPATIENT_CLINIC_OR_DEPARTMENT_OTHER): Payer: 59

## 2015-03-29 VITALS — BP 99/62 | HR 93 | Temp 98.6°F | Resp 20

## 2015-03-29 DIAGNOSIS — Z5111 Encounter for antineoplastic chemotherapy: Secondary | ICD-10-CM | POA: Diagnosis not present

## 2015-03-29 DIAGNOSIS — C50511 Malignant neoplasm of lower-outer quadrant of right female breast: Secondary | ICD-10-CM

## 2015-03-29 DIAGNOSIS — Z95828 Presence of other vascular implants and grafts: Secondary | ICD-10-CM

## 2015-03-29 LAB — CBC WITH DIFFERENTIAL/PLATELET
BASO%: 0.2 % (ref 0.0–2.0)
Basophils Absolute: 0 10*3/uL (ref 0.0–0.1)
EOS ABS: 0.1 10*3/uL (ref 0.0–0.5)
EOS%: 3.1 % (ref 0.0–7.0)
HCT: 29.2 % — ABNORMAL LOW (ref 34.8–46.6)
HGB: 9.3 g/dL — ABNORMAL LOW (ref 11.6–15.9)
LYMPH%: 5.5 % — AB (ref 14.0–49.7)
MCH: 30.1 pg (ref 25.1–34.0)
MCHC: 31.8 g/dL (ref 31.5–36.0)
MCV: 94.5 fL (ref 79.5–101.0)
MONO#: 0.5 10*3/uL (ref 0.1–0.9)
MONO%: 10.8 % (ref 0.0–14.0)
NEUT%: 80.4 % — AB (ref 38.4–76.8)
NEUTROS ABS: 3.7 10*3/uL (ref 1.5–6.5)
Platelets: 225 10*3/uL (ref 145–400)
RBC: 3.09 10*6/uL — AB (ref 3.70–5.45)
RDW: 18.4 % — ABNORMAL HIGH (ref 11.2–14.5)
WBC: 4.6 10*3/uL (ref 3.9–10.3)
lymph#: 0.3 10*3/uL — ABNORMAL LOW (ref 0.9–3.3)

## 2015-03-29 LAB — COMPREHENSIVE METABOLIC PANEL (CC13)
ALT: 29 U/L (ref 0–55)
ANION GAP: 10 meq/L (ref 3–11)
AST: 20 U/L (ref 5–34)
Albumin: 3.1 g/dL — ABNORMAL LOW (ref 3.5–5.0)
Alkaline Phosphatase: 78 U/L (ref 40–150)
BUN: 12.5 mg/dL (ref 7.0–26.0)
CHLORIDE: 104 meq/L (ref 98–109)
CO2: 23 meq/L (ref 22–29)
Calcium: 9.3 mg/dL (ref 8.4–10.4)
Creatinine: 0.7 mg/dL (ref 0.6–1.1)
GLUCOSE: 103 mg/dL (ref 70–140)
POTASSIUM: 4.2 meq/L (ref 3.5–5.1)
SODIUM: 137 meq/L (ref 136–145)
TOTAL PROTEIN: 6 g/dL — AB (ref 6.4–8.3)
Total Bilirubin: 0.59 mg/dL (ref 0.20–1.20)

## 2015-03-29 MED ORDER — PALONOSETRON HCL INJECTION 0.25 MG/5ML
0.2500 mg | Freq: Once | INTRAVENOUS | Status: AC
  Administered 2015-03-29: 0.25 mg via INTRAVENOUS

## 2015-03-29 MED ORDER — PALONOSETRON HCL INJECTION 0.25 MG/5ML
INTRAVENOUS | Status: AC
  Filled 2015-03-29: qty 5

## 2015-03-29 MED ORDER — SODIUM CHLORIDE 0.9 % IJ SOLN
10.0000 mL | INTRAMUSCULAR | Status: DC | PRN
  Administered 2015-03-29: 10 mL
  Filled 2015-03-29: qty 10

## 2015-03-29 MED ORDER — SODIUM CHLORIDE 0.9 % IV SOLN
Freq: Once | INTRAVENOUS | Status: AC
  Administered 2015-03-29: 10:00:00 via INTRAVENOUS

## 2015-03-29 MED ORDER — HEPARIN SOD (PORK) LOCK FLUSH 100 UNIT/ML IV SOLN
500.0000 [IU] | Freq: Once | INTRAVENOUS | Status: AC | PRN
  Administered 2015-03-29: 500 [IU]
  Filled 2015-03-29: qty 5

## 2015-03-29 MED ORDER — PACLITAXEL PROTEIN-BOUND CHEMO INJECTION 100 MG
75.0000 mg/m2 | Freq: Once | INTRAVENOUS | Status: AC
  Administered 2015-03-29: 150 mg via INTRAVENOUS
  Filled 2015-03-29: qty 30

## 2015-03-29 MED ORDER — SODIUM CHLORIDE 0.9 % IJ SOLN
10.0000 mL | INTRAMUSCULAR | Status: DC | PRN
  Administered 2015-03-29: 10 mL via INTRAVENOUS
  Filled 2015-03-29: qty 10

## 2015-03-29 NOTE — Patient Instructions (Signed)

## 2015-03-29 NOTE — Patient Instructions (Signed)
[facility] Discharge Instructions for Patients Receiving Chemotherapy  Today you received the following chemotherapy agents abraxane  To help prevent nausea and vomiting after your treatment, we encourage you to take your nausea medication    If you develop nausea and vomiting that is not controlled by your nausea medication, call the clinic.   BELOW ARE SYMPTOMS THAT SHOULD BE REPORTED IMMEDIATELY:  *FEVER GREATER THAN 100.5 F  *CHILLS WITH OR WITHOUT FEVER  NAUSEA AND VOMITING THAT IS NOT CONTROLLED WITH YOUR NAUSEA MEDICATION  *UNUSUAL SHORTNESS OF BREATH  *UNUSUAL BRUISING OR BLEEDING  TENDERNESS IN MOUTH AND THROAT WITH OR WITHOUT PRESENCE OF ULCERS  *URINARY PROBLEMS  *BOWEL PROBLEMS  UNUSUAL RASH Items with * indicate a potential emergency and should be followed up as soon as possible.  Feel free to call the clinic you have any questions or concerns. The clinic phone number is [phone].  Please show the CHEMO ALERT CARD at check-in to the Emergency Department and triage nurse.   

## 2015-04-04 ENCOUNTER — Other Ambulatory Visit: Payer: Self-pay

## 2015-04-05 ENCOUNTER — Ambulatory Visit (HOSPITAL_BASED_OUTPATIENT_CLINIC_OR_DEPARTMENT_OTHER): Payer: 59

## 2015-04-05 ENCOUNTER — Telehealth: Payer: Self-pay | Admitting: Hematology and Oncology

## 2015-04-05 ENCOUNTER — Other Ambulatory Visit (HOSPITAL_BASED_OUTPATIENT_CLINIC_OR_DEPARTMENT_OTHER): Payer: 59

## 2015-04-05 ENCOUNTER — Encounter: Payer: Self-pay | Admitting: Hematology and Oncology

## 2015-04-05 ENCOUNTER — Ambulatory Visit (HOSPITAL_BASED_OUTPATIENT_CLINIC_OR_DEPARTMENT_OTHER): Payer: 59 | Admitting: Hematology and Oncology

## 2015-04-05 VITALS — BP 158/85 | HR 101 | Temp 98.8°F | Resp 18 | Ht 64.0 in | Wt 166.9 lb

## 2015-04-05 DIAGNOSIS — T451X5A Adverse effect of antineoplastic and immunosuppressive drugs, initial encounter: Secondary | ICD-10-CM

## 2015-04-05 DIAGNOSIS — C773 Secondary and unspecified malignant neoplasm of axilla and upper limb lymph nodes: Secondary | ICD-10-CM

## 2015-04-05 DIAGNOSIS — Z452 Encounter for adjustment and management of vascular access device: Secondary | ICD-10-CM | POA: Diagnosis not present

## 2015-04-05 DIAGNOSIS — Z5111 Encounter for antineoplastic chemotherapy: Secondary | ICD-10-CM | POA: Diagnosis not present

## 2015-04-05 DIAGNOSIS — D6481 Anemia due to antineoplastic chemotherapy: Secondary | ICD-10-CM

## 2015-04-05 DIAGNOSIS — Z95828 Presence of other vascular implants and grafts: Secondary | ICD-10-CM

## 2015-04-05 DIAGNOSIS — C50511 Malignant neoplasm of lower-outer quadrant of right female breast: Secondary | ICD-10-CM

## 2015-04-05 DIAGNOSIS — Z170 Estrogen receptor positive status [ER+]: Secondary | ICD-10-CM | POA: Diagnosis not present

## 2015-04-05 LAB — COMPREHENSIVE METABOLIC PANEL (CC13)
ALK PHOS: 86 U/L (ref 40–150)
ALT: 35 U/L (ref 0–55)
AST: 27 U/L (ref 5–34)
Albumin: 3.4 g/dL — ABNORMAL LOW (ref 3.5–5.0)
Anion Gap: 9 mEq/L (ref 3–11)
BUN: 10.8 mg/dL (ref 7.0–26.0)
CO2: 24 meq/L (ref 22–29)
Calcium: 9.5 mg/dL (ref 8.4–10.4)
Chloride: 106 mEq/L (ref 98–109)
Creatinine: 0.7 mg/dL (ref 0.6–1.1)
EGFR: >90 mL/min/{1.73_m2} (ref >90)
Glucose: 100 mg/dl (ref 70–140)
Potassium: 3.9 mEq/L (ref 3.5–5.1)
SODIUM: 140 meq/L (ref 136–145)
TOTAL PROTEIN: 6.2 g/dL — AB (ref 6.4–8.3)
Total Bilirubin: 0.95 mg/dL (ref 0.20–1.20)

## 2015-04-05 LAB — CBC WITH DIFFERENTIAL/PLATELET
BASO%: 0.7 % (ref 0.0–2.0)
Basophils Absolute: 0 10*3/uL (ref 0.0–0.1)
EOS ABS: 0.1 10*3/uL (ref 0.0–0.5)
EOS%: 2.1 % (ref 0.0–7.0)
HCT: 28.5 % — ABNORMAL LOW (ref 34.8–46.6)
HGB: 9.2 g/dL — ABNORMAL LOW (ref 11.6–15.9)
LYMPH%: 10.8 % — ABNORMAL LOW (ref 14.0–49.7)
MCH: 30.6 pg (ref 25.1–34.0)
MCHC: 32.3 g/dL (ref 31.5–36.0)
MCV: 94.7 fL (ref 79.5–101.0)
MONO#: 0.3 10*3/uL (ref 0.1–0.9)
MONO%: 11.2 % (ref 0.0–14.0)
NEUT%: 75.2 % (ref 38.4–76.8)
NEUTROS ABS: 2.2 10*3/uL (ref 1.5–6.5)
NRBC: 1 % — AB (ref 0–0)
Platelets: 245 10*3/uL (ref 145–400)
RBC: 3.01 10*6/uL — AB (ref 3.70–5.45)
RDW: 17.7 % — AB (ref 11.2–14.5)
WBC: 2.9 10*3/uL — AB (ref 3.9–10.3)
lymph#: 0.3 10*3/uL — ABNORMAL LOW (ref 0.9–3.3)

## 2015-04-05 MED ORDER — ALTEPLASE 2 MG IJ SOLR
2.0000 mg | Freq: Once | INTRAMUSCULAR | Status: AC | PRN
  Administered 2015-04-05: 2 mg
  Filled 2015-04-05: qty 2

## 2015-04-05 MED ORDER — PALONOSETRON HCL INJECTION 0.25 MG/5ML
0.2500 mg | Freq: Once | INTRAVENOUS | Status: AC
  Administered 2015-04-05: 0.25 mg via INTRAVENOUS

## 2015-04-05 MED ORDER — PACLITAXEL PROTEIN-BOUND CHEMO INJECTION 100 MG
75.0000 mg/m2 | Freq: Once | INTRAVENOUS | Status: AC
  Administered 2015-04-05: 150 mg via INTRAVENOUS
  Filled 2015-04-05: qty 30

## 2015-04-05 MED ORDER — SODIUM CHLORIDE 0.9 % IJ SOLN
10.0000 mL | INTRAMUSCULAR | Status: DC | PRN
  Administered 2015-04-05: 10 mL
  Filled 2015-04-05: qty 10

## 2015-04-05 MED ORDER — SODIUM CHLORIDE 0.9 % IV SOLN
Freq: Once | INTRAVENOUS | Status: AC
  Administered 2015-04-05: 12:00:00 via INTRAVENOUS

## 2015-04-05 MED ORDER — PALONOSETRON HCL INJECTION 0.25 MG/5ML
INTRAVENOUS | Status: AC
  Filled 2015-04-05: qty 5

## 2015-04-05 MED ORDER — SODIUM CHLORIDE 0.9 % IJ SOLN
10.0000 mL | INTRAMUSCULAR | Status: DC | PRN
  Administered 2015-04-05: 10 mL via INTRAVENOUS
  Filled 2015-04-05: qty 10

## 2015-04-05 MED ORDER — HEPARIN SOD (PORK) LOCK FLUSH 100 UNIT/ML IV SOLN
500.0000 [IU] | Freq: Once | INTRAVENOUS | Status: AC | PRN
  Administered 2015-04-05: 500 [IU]
  Filled 2015-04-05: qty 5

## 2015-04-05 NOTE — Patient Instructions (Signed)
[facility] Discharge Instructions for Patients Receiving Chemotherapy  Today you received the following chemotherapy agents :  Abraxane.  To help prevent nausea and vomiting after your treatment, we encourage you to take your nausea medication as prescribed by your physician.   If you develop nausea and vomiting that is not controlled by your nausea medication, call the clinic.   BELOW ARE SYMPTOMS THAT SHOULD BE REPORTED IMMEDIATELY:  *FEVER GREATER THAN 100.5 F  *CHILLS WITH OR WITHOUT FEVER  NAUSEA AND VOMITING THAT IS NOT CONTROLLED WITH YOUR NAUSEA MEDICATION  *UNUSUAL SHORTNESS OF BREATH  *UNUSUAL BRUISING OR BLEEDING  TENDERNESS IN MOUTH AND THROAT WITH OR WITHOUT PRESENCE OF ULCERS  *URINARY PROBLEMS  *BOWEL PROBLEMS  UNUSUAL RASH Items with * indicate a potential emergency and should be followed up as soon as possible.  Feel free to call the clinic you have any questions or concerns. The clinic phone number is [phone].  Please show the CHEMO ALERT CARD at check-in to the Emergency Department and triage nurse.   

## 2015-04-05 NOTE — Progress Notes (Signed)
Patient Care Team: Jonathon Jordan, MD as PCP - General (Family Medicine) Carola Frost, RN as Registered Nurse Nicholas Lose, MD as Consulting Physician (Hematology and Oncology)  DIAGNOSIS: No matching staging information was found for the patient.  SUMMARY OF ONCOLOGIC HISTORY:   Breast cancer of lower-outer quadrant of right female breast   11/06/2008 Surgery Left breast lumpectomy: High-grade DCIS 2.8 cm, 18 lymph nodes negative, ER 98%, PR 81%, stage 0, patient refused antiestrogen therapy   10/18/2014 Initial Biopsy Right breast invasive ductal carcinoma with DCIS grade 3, lymphovascular invasion, ER 100%, PR 12%, HER-2 negative ratio 1.4, Ki-67 51%, T2 N0 M0 stage II a clinical stage   10/24/2014 Breast MRI Right breast irregular enhancing mass 2.9 x 2.6 x 2.4 cm, left breast evidence of prior lumpectomy and left axillary dissection   11/27/2014 Surgery Right lumpectomy: Invasive ductal carcinoma grade 3, 3.2 cm, with high-grade DCIS, LVI present,, 1/1 intramammary lymph node positive, 1 SLN micro-met, stage IIB, T2 N1a   12/28/2014 -  Chemotherapy Adjuvant dose dense Adriamycin and Cytoxan X 4 followed by Abraxane weekly 12    CHIEF COMPLIANT: cycle 7/12 Abraxane  INTERVAL HISTORY: Natalie Valdez is a 64 year old with above-mentioned history of right breast cancer and left breast DCIS. She is currently on adjuvant chemotherapy. She is receiving cycle 7 of Abraxane today. She denies any nausea or vomiting. Her major complaint is lack of taste as a result of that decreased appetite. She is able to drink lots of milk with protein powder. Everything tastes salty to her. Denies any tingling or numbness of her hands or feet. She has been working part-time but has to come back home and sleep. She is barely doing anything beyond that at this time.  REVIEW OF SYSTEMS:   Constitutional: Denies fevers, chills or abnormal weight loss Eyes: Denies blurriness of vision Ears, nose, mouth, throat, and  face: Denies mucositis or sore throat Respiratory: Denies cough, dyspnea or wheezes Cardiovascular: Denies palpitation, chest discomfort , chronic lower extremity swelling Gastrointestinal:  Denies nausea, heartburn or change in bowel habits Skin: Denies abnormal skin rashes Lymphatics: Denies new lymphadenopathy or easy bruising Neurological:Denies numbness, tingling or new weaknesses Behavioral/Psych: Mood is stable, no new changes  Breast:  denies any pain or lumps or nodules in either breasts All other systems were reviewed with the patient and are negative.  I have reviewed the past medical history, past surgical history, social history and family history with the patient and they are unchanged from previous note.  ALLERGIES:  has No Known Allergies.  MEDICATIONS:  Current Outpatient Prescriptions  Medication Sig Dispense Refill  . Alum & Mag Hydroxide-Simeth (MAGIC MOUTHWASH W/LIDOCAINE) SOLN Take 5 mLs by mouth 3 (three) times daily as needed for mouth pain. 60 mL 1  . Atorvastatin Calcium (INVESTIGATIONAL ATORVASTATIN/PLACEBO) 40 MG tablet [facility] [id number] Take 1 tablet by mouth daily. Take 2 doses (these doses must be 12 hours apart) prior to first chemotherapy treatment. Then take 1 tablet daily by mouth with or without food. 180 tablet 0  . celecoxib (CELEBREX) 200 MG capsule Take 200 mg by mouth 1 day or 1 dose.    . Cholecalciferol ([employer]S VIT D 5000 HIGH-POTENCY PO) Take by mouth.    . citalopram (CELEXA) 20 MG tablet Take 20 mg by mouth every evening.   11  . Cyanocobalamin (B-12 PO) Take 1 tablet by mouth daily at 8 pm.    . lidocaine-prilocaine (EMLA) cream APP EXT AA 1  TIME  3  . loratadine (CLARITIN) 10 MG tablet Take 10 mg by mouth daily.    Marland Kitchen LORazepam (ATIVAN) 0.5 MG tablet TAKE 1 TABLET BY MOUTH AT BEDTIME 30 tablet 0  . omeprazole (PRILOSEC) 40 MG capsule Take 1 capsule (40 mg total) by mouth daily. 30 capsule 3  . prochlorperazine (COMPAZINE) 10 MG tablet Take 10  mg by mouth at bedtime.  1  . RESTASIS 0.05 % ophthalmic emulsion Place into both eyes 2 (two) times daily.   9  . UNABLE TO FIND 1 each by Other route continuous as needed. 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 1 each 1  . zolpidem (AMBIEN) 10 MG tablet   5   No current facility-administered medications for this visit.    PHYSICAL EXAMINATION: ECOG PERFORMANCE STATUS: 1 - Symptomatic but completely ambulatory  Filed Vitals:   04/05/15 0949  BP: 113/66  Pulse: 73  Temp: 98.4 F (36.9 C)  Resp: 17   Filed Weights   04/05/15 0943 04/05/15 0949  Weight: 166 lb 14.4 oz (75.705 kg) 215 lb 11.2 oz (97.841 kg)    GENERAL:alert, no distress and comfortable SKIN: skin color, texture, turgor are normal, no rashes or significant lesions EYES: normal, Conjunctiva are pink and non-injected, sclera clear OROPHARYNX:no exudate, no erythema and lips, buccal mucosa, and tongue normal  NECK: supple, thyroid normal size, non-tender, without nodularity LYMPH:  no palpable lymphadenopathy in the cervical, axillary or inguinal LUNGS: clear to auscultation and percussion with normal breathing effort HEART: regular rate & rhythm and no murmurs and no lower extremity edema ABDOMEN:abdomen soft, non-tender and normal bowel sounds Musculoskeletal:no cyanosis of digits and no clubbing  NEURO: alert & oriented x 3 with fluent speech, no focal motor/sensory deficits  LABORATORY DATA:  I have reviewed the data as listed   Chemistry      Component Value Date/Time   NA 137 03/29/2015 0903   NA 138 11/26/2014 1508   K 4.2 03/29/2015 0903   K 4.6 11/26/2014 1508   CL 103 11/26/2014 1508   CO2 23 03/29/2015 0903   CO2 25 11/26/2014 1508   BUN 12.5 03/29/2015 0903   BUN 17 11/26/2014 1508   CREATININE 0.7 03/29/2015 0903   CREATININE 0.83 11/26/2014 1508      Component Value Date/Time   CALCIUM 9.3 03/29/2015 0903    CALCIUM 9.3 11/26/2014 1508   ALKPHOS 78 03/29/2015 0903   ALKPHOS 90 11/26/2014 1508   AST 20 03/29/2015 0903   AST 17 11/26/2014 1508   ALT 29 03/29/2015 0903   ALT 15 11/26/2014 1508   BILITOT 0.59 03/29/2015 0903   BILITOT 0.3 11/26/2014 1508       Lab Results  Component Value Date   WBC 4.6 03/29/2015   HGB 9.3* 03/29/2015   HCT 29.2* 03/29/2015   MCV 94.5 03/29/2015   PLT 225 03/29/2015   NEUTROABS 3.7 03/29/2015   ASSESSMENT & PLAN:  Breast cancer of lower-outer quadrant of right female breast Right lumpectomy 11/27/2014: Invasive ductal carcinoma grade 3, 3.2 cm, with high-grade DCIS, LVI present,, 1/1 intramammary lymph node positive, 1 SLN micro-met, stage IIB, T2 N1a ER 100%, PR 12%, HER-2 negative, Ki-67 51% Patient is enrolled on PREVENT clinical trial Lipitor versus placebo  Treatment plan: 1. Adjuvant chemotherapy with dose dense Adriamycin and Cytoxan 4 followed by Abraxane weekly 12 2. Adjuvant radiation therapy followed by 3. Anti-estrogen therapy  Current treatment:  Adjuvant chemotherapy: Cycle 7/12 Abraxane (decreased dose to 75 mg/m2) Echocardiogram 12/11/2014: EF 60-65%  Chemo toxicities:  1. Nausea: managed with zofran and compazine 2. Constipation: managed with miralax 3. Heartburn: likely the result of steroids. takes prilosec 23m daily.  4. neulasta bone pain: claritin x 7 days, advil PRN pain  5. Neutropenia: resolved 6. Depression: continue celexa 7. Treatment related anemia: awaiting today's blood work  Recent fall: Patient denies neuropathy. She had a fall and scabbed left side of the lip. Apparently patient has a tendency to fall throughout her life. She does not complain of neuropathy. So I do not believe it is related to her chemotherapy.  [facility] The report had an abnormal reading, identifying a lesion in the right breast. mammogram and Ultrasound: Normal  PREVENT trial: No toxicities to the  treatment liver function tests are normal, no myalgias  Return to clinic in 2 weeks with cycle 9/12 Abraxane.   No orders of the defined types were placed in this encounter.   The patient has a good understanding of the overall plan. she agrees with it. she will call with any problems that may develop before the next visit here.   GRulon Eisenmenger MD

## 2015-04-05 NOTE — Progress Notes (Signed)
Pt in for labs via [place] with infusion to follow. Pt accessed without any difficulties, flushes well. No blood return was noted. Attempted multiple maneuvers with no blood return. Called and informed Terri, RN with Dr. Lindi Adie. Terri placed cath-flo in pac, labs pending MD orders for possible peripheral stick.

## 2015-04-05 NOTE — Addendum Note (Signed)
Addended by: Prentiss Bells on: 04/05/2015 10:27 AM   Modules accepted: Medications

## 2015-04-05 NOTE — Progress Notes (Signed)
TPA instilled by this RN in flush room at 937.  Return checked at 1007 - no blood return.  Patient arrived to treatment.  Charge nurse notified that port is not returning blood and TPA dwelling.

## 2015-04-05 NOTE — Patient Instructions (Signed)

## 2015-04-05 NOTE — Progress Notes (Signed)
Labs reviewed with Dr. Lindi Adie; OK to proceed with treatment with WBC 2.9

## 2015-04-05 NOTE — Telephone Encounter (Signed)
Gave avs & calendar for September. Sent message to schedule chemo for 09/16

## 2015-04-05 NOTE — Assessment & Plan Note (Signed)
Right lumpectomy 11/27/2014: Invasive ductal carcinoma grade 3, 3.2 cm, with high-grade DCIS, LVI present,, 1/1 intramammary lymph node positive, 1 SLN micro-met, stage IIB, T2 N1a ER 100%, PR 12%, HER-2 negative, Ki-67 51% Patient is enrolled on PREVENT clinical trial Lipitor versus placebo  Treatment plan: 1. Adjuvant chemotherapy with dose dense Adriamycin and Cytoxan 4 followed by Abraxane weekly 12 2. Adjuvant radiation therapy followed by 3. Anti-estrogen therapy  Current treatment: Adjuvant chemotherapy: Cycle 7/12 Abraxane (decreased dose to 75 mg/m2) Echocardiogram 12/11/2014: EF 60-65%  Chemo toxicities:  1. Nausea: managed with zofran and compazine 2. Constipation: managed with miralax 3. Heartburn: likely the result of steroids. Prescribed prilosec 12m daily.  4. neulasta bone pain: claritin x 7 days, advil PRN pain  5. Neutropenia: resolved 6. Depression: continue celexa 7. Treatment related anemia: hgb up to 10.3 today after 2 units of blood last week.  [facility] had an abnormal reading, identifying a lesion in the right breast. mammogram and Ultrasound: Normal  PREVENT trial: No toxicities to the treatment liver function tests are normal, no myalgias  Return to clinic in 2 weeks with cycle 9/12 Abraxane.

## 2015-04-09 ENCOUNTER — Other Ambulatory Visit: Payer: Self-pay

## 2015-04-09 DIAGNOSIS — C50511 Malignant neoplasm of lower-outer quadrant of right female breast: Secondary | ICD-10-CM

## 2015-04-09 MED ORDER — PROCHLORPERAZINE MALEATE 10 MG PO TABS: 10.0000 mg | ORAL_TABLET | Freq: Four times a day (QID) | ORAL | Status: DC | PRN

## 2015-04-10 ENCOUNTER — Other Ambulatory Visit: Payer: Self-pay | Admitting: Hematology and Oncology

## 2015-04-10 NOTE — Telephone Encounter (Signed)
On active treatment

## 2015-04-12 ENCOUNTER — Ambulatory Visit: Payer: 59

## 2015-04-12 ENCOUNTER — Other Ambulatory Visit (HOSPITAL_BASED_OUTPATIENT_CLINIC_OR_DEPARTMENT_OTHER): Payer: 59

## 2015-04-12 ENCOUNTER — Ambulatory Visit (HOSPITAL_BASED_OUTPATIENT_CLINIC_OR_DEPARTMENT_OTHER): Payer: 59

## 2015-04-12 DIAGNOSIS — C50511 Malignant neoplasm of lower-outer quadrant of right female breast: Secondary | ICD-10-CM

## 2015-04-12 DIAGNOSIS — Z5111 Encounter for antineoplastic chemotherapy: Secondary | ICD-10-CM | POA: Diagnosis not present

## 2015-04-12 DIAGNOSIS — Z95828 Presence of other vascular implants and grafts: Secondary | ICD-10-CM

## 2015-04-12 LAB — CBC WITH DIFFERENTIAL/PLATELET
BASO%: 0.6 % (ref 0.0–2.0)
BASOS ABS: 0 10*3/uL (ref 0.0–0.1)
EOS ABS: 0.1 10*3/uL (ref 0.0–0.5)
EOS%: 2.4 % (ref 0.0–7.0)
HEMATOCRIT: 27.5 % — AB (ref 34.8–46.6)
HGB: 8.8 g/dL — ABNORMAL LOW (ref 11.6–15.9)
LYMPH#: 0.5 10*3/uL — AB (ref 0.9–3.3)
LYMPH%: 15.2 % (ref 14.0–49.7)
MCH: 31.2 pg (ref 25.1–34.0)
MCHC: 32 g/dL (ref 31.5–36.0)
MCV: 97.5 fL (ref 79.5–101.0)
MONO#: 0.4 10*3/uL (ref 0.1–0.9)
MONO%: 10.9 % (ref 0.0–14.0)
NEUT#: 2.3 10*3/uL (ref 1.5–6.5)
NEUT%: 70.9 % (ref 38.4–76.8)
PLATELETS: 234 10*3/uL (ref 145–400)
RBC: 2.82 10*6/uL — ABNORMAL LOW (ref 3.70–5.45)
RDW: 17.2 % — ABNORMAL HIGH (ref 11.2–14.5)
WBC: 3.3 10*3/uL — ABNORMAL LOW (ref 3.9–10.3)

## 2015-04-12 LAB — COMPREHENSIVE METABOLIC PANEL (CC13)
ALBUMIN: 3.4 g/dL — AB (ref 3.5–5.0)
ALK PHOS: 87 U/L (ref 40–150)
ALT: 23 U/L (ref 0–55)
ANION GAP: 8 meq/L (ref 3–11)
AST: 22 U/L (ref 5–34)
BUN: 11.8 mg/dL (ref 7.0–26.0)
CALCIUM: 9.3 mg/dL (ref 8.4–10.4)
CO2: 24 mEq/L (ref 22–29)
CREATININE: 0.7 mg/dL (ref 0.6–1.1)
Chloride: 110 mEq/L — ABNORMAL HIGH (ref 98–109)
EGFR: >90 mL/min/{1.73_m2} (ref >90)
Glucose: 124 mg/dl (ref 70–140)
Potassium: 4.1 mEq/L (ref 3.5–5.1)
Sodium: 142 mEq/L (ref 136–145)
Total Bilirubin: 0.6 mg/dL (ref 0.20–1.20)
Total Protein: 6.1 g/dL — ABNORMAL LOW (ref 6.4–8.3)

## 2015-04-12 MED ORDER — PACLITAXEL PROTEIN-BOUND CHEMO INJECTION 100 MG
75.0000 mg/m2 | Freq: Once | INTRAVENOUS | Status: AC
  Administered 2015-04-12: 150 mg via INTRAVENOUS
  Filled 2015-04-12: qty 30

## 2015-04-12 MED ORDER — SODIUM CHLORIDE 0.9 % IV SOLN
Freq: Once | INTRAVENOUS | Status: AC
  Administered 2015-04-12: 09:00:00 via INTRAVENOUS

## 2015-04-12 MED ORDER — PALONOSETRON HCL INJECTION 0.25 MG/5ML
0.2500 mg | Freq: Once | INTRAVENOUS | Status: AC
  Administered 2015-04-12: 0.25 mg via INTRAVENOUS

## 2015-04-12 MED ORDER — PALONOSETRON HCL INJECTION 0.25 MG/5ML
INTRAVENOUS | Status: AC
  Filled 2015-04-12: qty 5

## 2015-04-12 MED ORDER — SODIUM CHLORIDE 0.9 % IJ SOLN
10.0000 mL | INTRAMUSCULAR | Status: DC | PRN
  Administered 2015-04-12: 10 mL
  Filled 2015-04-12: qty 10

## 2015-04-12 MED ORDER — SODIUM CHLORIDE 0.9 % IJ SOLN
10.0000 mL | INTRAMUSCULAR | Status: DC | PRN
  Administered 2015-04-12: 10 mL via INTRAVENOUS
  Filled 2015-04-12: qty 10

## 2015-04-12 MED ORDER — HEPARIN SOD (PORK) LOCK FLUSH 100 UNIT/ML IV SOLN
500.0000 [IU] | Freq: Once | INTRAVENOUS | Status: AC | PRN
  Administered 2015-04-12: 500 [IU]
  Filled 2015-04-12: qty 5

## 2015-04-12 NOTE — Patient Instructions (Signed)

## 2015-04-12 NOTE — Patient Instructions (Signed)
[facility] Discharge Instructions for Patients Receiving Chemotherapy  Today you received the following chemotherapy agents :  Abraxane.  To help prevent nausea and vomiting after your treatment, we encourage you to take your nausea medication as prescribed by your physician.   If you develop nausea and vomiting that is not controlled by your nausea medication, call the clinic.   BELOW ARE SYMPTOMS THAT SHOULD BE REPORTED IMMEDIATELY:  *FEVER GREATER THAN 100.5 F  *CHILLS WITH OR WITHOUT FEVER  NAUSEA AND VOMITING THAT IS NOT CONTROLLED WITH YOUR NAUSEA MEDICATION  *UNUSUAL SHORTNESS OF BREATH  *UNUSUAL BRUISING OR BLEEDING  TENDERNESS IN MOUTH AND THROAT WITH OR WITHOUT PRESENCE OF ULCERS  *URINARY PROBLEMS  *BOWEL PROBLEMS  UNUSUAL RASH Items with * indicate a potential emergency and should be followed up as soon as possible.  Feel free to call the clinic you have any questions or concerns. The clinic phone number is [phone].  Please show the CHEMO ALERT CARD at check-in to the Emergency Department and triage nurse.   

## 2015-04-18 NOTE — Assessment & Plan Note (Signed)
Right lumpectomy 11/27/2014: Invasive ductal carcinoma grade 3, 3.2 cm, with high-grade DCIS, LVI present,, 1/1 intramammary lymph node positive, 1 SLN micro-met, stage IIB, T2 N1a ER 100%, PR 12%, HER-2 negative, Ki-67 51% Patient is enrolled on PREVENT clinical trial Lipitor versus placebo  Treatment plan: 1. Adjuvant chemotherapy with dose dense Adriamycin and Cytoxan 4 followed by Abraxane weekly 12 2. Adjuvant radiation therapy followed by 3. Anti-estrogen therapy  Current treatment: Adjuvant chemotherapy: Cycle 9/12 Abraxane (decreased dose to 75 mg/m2) Echocardiogram 12/11/2014: EF 60-65%  Chemo toxicities:  1. Nausea: managed with zofran and compazine 2. Constipation: managed with miralax 3. Heartburn: likely the result of steroids. takes prilosec 3m daily.  4. neulasta bone pain: claritin x 7 days, advil PRN pain  5. Neutropenia: resolved 6. Depression: continue celexa 7. Treatment related anemia: awaiting today's blood work  Recent fall: Patient denies neuropathy. She had a fall and scabbed left side of the lip. Apparently patient has a tendency to fall throughout her life. She does not complain of neuropathy. So I do not believe it is related to her chemotherapy.  [facility] had an abnormal reading, identifying a lesion in the right breast. mammogram and Ultrasound: Normal  PREVENT trial: No toxicities to the treatment liver function tests are normal, no myalgias  Return to clinic in 2 weeks with cycle 11/12 Abraxane.

## 2015-04-19 ENCOUNTER — Encounter: Payer: Self-pay | Admitting: Hematology and Oncology

## 2015-04-19 ENCOUNTER — Ambulatory Visit: Payer: 59

## 2015-04-19 ENCOUNTER — Ambulatory Visit (HOSPITAL_BASED_OUTPATIENT_CLINIC_OR_DEPARTMENT_OTHER): Payer: 59 | Admitting: Hematology and Oncology

## 2015-04-19 ENCOUNTER — Other Ambulatory Visit (HOSPITAL_BASED_OUTPATIENT_CLINIC_OR_DEPARTMENT_OTHER): Payer: 59

## 2015-04-19 ENCOUNTER — Telehealth: Payer: Self-pay | Admitting: Hematology and Oncology

## 2015-04-19 ENCOUNTER — Ambulatory Visit (HOSPITAL_BASED_OUTPATIENT_CLINIC_OR_DEPARTMENT_OTHER): Payer: 59

## 2015-04-19 VITALS — BP 130/68 | HR 81 | Temp 98.0°F | Resp 18 | Ht 64.0 in | Wt 165.1 lb

## 2015-04-19 DIAGNOSIS — D701 Agranulocytosis secondary to cancer chemotherapy: Secondary | ICD-10-CM

## 2015-04-19 DIAGNOSIS — R12 Heartburn: Secondary | ICD-10-CM | POA: Diagnosis not present

## 2015-04-19 DIAGNOSIS — C50511 Malignant neoplasm of lower-outer quadrant of right female breast: Secondary | ICD-10-CM

## 2015-04-19 DIAGNOSIS — C773 Secondary and unspecified malignant neoplasm of axilla and upper limb lymph nodes: Secondary | ICD-10-CM | POA: Diagnosis not present

## 2015-04-19 DIAGNOSIS — Z95828 Presence of other vascular implants and grafts: Secondary | ICD-10-CM

## 2015-04-19 DIAGNOSIS — D6481 Anemia due to antineoplastic chemotherapy: Secondary | ICD-10-CM | POA: Diagnosis not present

## 2015-04-19 DIAGNOSIS — Z5111 Encounter for antineoplastic chemotherapy: Secondary | ICD-10-CM | POA: Diagnosis not present

## 2015-04-19 DIAGNOSIS — T451X5A Adverse effect of antineoplastic and immunosuppressive drugs, initial encounter: Secondary | ICD-10-CM

## 2015-04-19 LAB — COMPREHENSIVE METABOLIC PANEL (CC13)
ALK PHOS: 90 U/L (ref 40–150)
ALT: 64 U/L — ABNORMAL HIGH (ref 0–55)
AST: 34 U/L (ref 5–34)
Albumin: 3.6 g/dL (ref 3.5–5.0)
Anion Gap: 9 mEq/L (ref 3–11)
BILIRUBIN TOTAL: 0.56 mg/dL (ref 0.20–1.20)
BUN: 7.9 mg/dL (ref 7.0–26.0)
CO2: 25 mEq/L (ref 22–29)
Calcium: 9.5 mg/dL (ref 8.4–10.4)
Chloride: 108 mEq/L (ref 98–109)
Creatinine: 0.7 mg/dL (ref 0.6–1.1)
Glucose: 113 mg/dl (ref 70–140)
Potassium: 3.7 mEq/L (ref 3.5–5.1)
SODIUM: 142 meq/L (ref 136–145)
TOTAL PROTEIN: 6.3 g/dL — AB (ref 6.4–8.3)

## 2015-04-19 LAB — CBC WITH DIFFERENTIAL/PLATELET
BASO%: 0.6 % (ref 0.0–2.0)
BASOS ABS: 0 10*3/uL (ref 0.0–0.1)
EOS ABS: 0.1 10*3/uL (ref 0.0–0.5)
EOS%: 1.9 % (ref 0.0–7.0)
HEMATOCRIT: 28.9 % — AB (ref 34.8–46.6)
HEMOGLOBIN: 9.1 g/dL — AB (ref 11.6–15.9)
LYMPH#: 0.5 10*3/uL — AB (ref 0.9–3.3)
LYMPH%: 15.1 % (ref 14.0–49.7)
MCH: 30.5 pg (ref 25.1–34.0)
MCHC: 31.5 g/dL (ref 31.5–36.0)
MCV: 97 fL (ref 79.5–101.0)
MONO#: 0.4 10*3/uL (ref 0.1–0.9)
MONO%: 12.9 % (ref 0.0–14.0)
NEUT#: 2.2 10*3/uL (ref 1.5–6.5)
NEUT%: 69.5 % (ref 38.4–76.8)
Platelets: 276 10*3/uL (ref 145–400)
RBC: 2.98 10*6/uL — ABNORMAL LOW (ref 3.70–5.45)
RDW: 17.3 % — AB (ref 11.2–14.5)
WBC: 3.1 10*3/uL — ABNORMAL LOW (ref 3.9–10.3)

## 2015-04-19 MED ORDER — PACLITAXEL PROTEIN-BOUND CHEMO INJECTION 100 MG
75.0000 mg/m2 | Freq: Once | INTRAVENOUS | Status: AC
  Administered 2015-04-19: 150 mg via INTRAVENOUS
  Filled 2015-04-19: qty 30

## 2015-04-19 MED ORDER — SODIUM CHLORIDE 0.9 % IJ SOLN
10.0000 mL | INTRAMUSCULAR | Status: DC | PRN
  Administered 2015-04-19: 10 mL
  Filled 2015-04-19: qty 10

## 2015-04-19 MED ORDER — PALONOSETRON HCL INJECTION 0.25 MG/5ML
INTRAVENOUS | Status: AC
  Filled 2015-04-19: qty 5

## 2015-04-19 MED ORDER — HEPARIN SOD (PORK) LOCK FLUSH 100 UNIT/ML IV SOLN
500.0000 [IU] | Freq: Once | INTRAVENOUS | Status: AC | PRN
Start: 2015-04-19 — End: 2015-04-19
  Administered 2015-04-19: 500 [IU]
  Filled 2015-04-19: qty 5

## 2015-04-19 MED ORDER — PALONOSETRON HCL INJECTION 0.25 MG/5ML
0.2500 mg | Freq: Once | INTRAVENOUS | Status: AC
  Administered 2015-04-19: 0.25 mg via INTRAVENOUS

## 2015-04-19 MED ORDER — SODIUM CHLORIDE 0.9 % IJ SOLN
10.0000 mL | INTRAMUSCULAR | Status: DC | PRN
  Administered 2015-04-19: 10 mL via INTRAVENOUS
  Filled 2015-04-19: qty 10

## 2015-04-19 MED ORDER — SODIUM CHLORIDE 0.9 % IV SOLN
Freq: Once | INTRAVENOUS | Status: AC
  Administered 2015-04-19: 10:00:00 via INTRAVENOUS

## 2015-04-19 NOTE — Patient Instructions (Signed)
[facility] Discharge Instructions for Patients Receiving Chemotherapy  Today you received the following chemotherapy agents :  Abraxane.  To help prevent nausea and vomiting after your treatment, we encourage you to take your nausea medication as prescribed by your physician.   If you develop nausea and vomiting that is not controlled by your nausea medication, call the clinic.   BELOW ARE SYMPTOMS THAT SHOULD BE REPORTED IMMEDIATELY:  *FEVER GREATER THAN 100.5 F  *CHILLS WITH OR WITHOUT FEVER  NAUSEA AND VOMITING THAT IS NOT CONTROLLED WITH YOUR NAUSEA MEDICATION  *UNUSUAL SHORTNESS OF BREATH  *UNUSUAL BRUISING OR BLEEDING  TENDERNESS IN MOUTH AND THROAT WITH OR WITHOUT PRESENCE OF ULCERS  *URINARY PROBLEMS  *BOWEL PROBLEMS  UNUSUAL RASH Items with * indicate a potential emergency and should be followed up as soon as possible.  Feel free to call the clinic you have any questions or concerns. The clinic phone number is [phone].  Please show the CHEMO ALERT CARD at check-in to the Emergency Department and triage nurse.   

## 2015-04-19 NOTE — Patient Instructions (Signed)

## 2015-04-19 NOTE — Telephone Encounter (Signed)
Appointments made and avs will be printed in chemo  anne °

## 2015-04-19 NOTE — Progress Notes (Signed)
Patient Care Team: Jonathon Jordan, MD as PCP - General (Family Medicine) Carola Frost, RN as Registered Nurse Nicholas Lose, MD as Consulting Physician (Hematology and Oncology)  DIAGNOSIS: No matching staging information was found for the patient.  SUMMARY OF ONCOLOGIC HISTORY:   Breast cancer of lower-outer quadrant of right female breast   11/06/2008 Surgery Left breast lumpectomy: High-grade DCIS 2.8 cm, 18 lymph nodes negative, ER 98%, PR 81%, stage 0, patient refused antiestrogen therapy   10/18/2014 Initial Biopsy Right breast invasive ductal carcinoma with DCIS grade 3, lymphovascular invasion, ER 100%, PR 12%, HER-2 negative ratio 1.4, Ki-67 51%, T2 N0 M0 stage II a clinical stage   10/24/2014 Breast MRI Right breast irregular enhancing mass 2.9 x 2.6 x 2.4 cm, left breast evidence of prior lumpectomy and left axillary dissection   11/27/2014 Surgery Right lumpectomy: Invasive ductal carcinoma grade 3, 3.2 cm, with high-grade DCIS, LVI present,, 1/1 intramammary lymph node positive, 1 SLN micro-met, stage IIB, T2 N1a   12/28/2014 -  Chemotherapy Adjuvant dose dense Adriamycin and Cytoxan X 4 followed by Abraxane weekly 12    CHIEF COMPLIANT: Cycle 9 Abraxane  INTERVAL HISTORY: Natalie Valdez is a 34 over the above-mentioned history right breast cancer currently on adjuvant chemotherapy. She is currently on weekly Abraxane. She has been tolerating Abraxane fairly well. Denies any neuropathy. She had a few days of diarrhea. She has felt better and has been eating much more. The taste buds are starting to come back. Denies any nausea vomiting.  REVIEW OF SYSTEMS:   Constitutional: Denies fevers, chills or abnormal weight loss, complains of fatigue Eyes: Denies blurriness of vision Ears, nose, mouth, throat, and face: Denies mucositis or sore throat Respiratory: Denies cough, dyspnea or wheezes Cardiovascular: Denies palpitation, chest discomfort or lower extremity  swelling Gastrointestinal: Loose stools and diarrhea Skin: Denies abnormal skin rashes Lymphatics: Denies new lymphadenopathy or easy bruising Neurological:Denies numbness, tingling or new weaknesses Behavioral/Psych: Mood is stable, no new changes   All other systems were reviewed with the patient and are negative.  I have reviewed the past medical history, past surgical history, social history and family history with the patient and they are unchanged from previous note.  ALLERGIES:  has No Known Allergies.  MEDICATIONS:  Current Outpatient Prescriptions  Medication Sig Dispense Refill  . Alum & Mag Hydroxide-Simeth (MAGIC MOUTHWASH W/LIDOCAINE) SOLN Take 5 mLs by mouth 3 (three) times daily as needed for mouth pain. 60 mL 1  . Atorvastatin Calcium (INVESTIGATIONAL ATORVASTATIN/PLACEBO) 40 MG tablet [facility] [address] Take 1 tablet by mouth daily. Take 2 doses (these doses must be 12 hours apart) prior to first chemotherapy treatment. Then take 1 tablet daily by mouth with or without food. 180 tablet 0  . celecoxib (CELEBREX) 200 MG capsule Take 200 mg by mouth 1 day or 1 dose.    . Cholecalciferol ([employer] VIT D 5000 HIGH-POTENCY PO) Take by mouth.    . citalopram (CELEXA) 20 MG tablet Take 20 mg by mouth every evening.   11  . Cyanocobalamin (B-12 PO) Take 1 tablet by mouth daily at 8 pm.    . lidocaine-prilocaine (EMLA) cream APP EXT AA 1 TIME  3  . loratadine (CLARITIN) 10 MG tablet Take 10 mg by mouth daily.    Marland Kitchen LORazepam (ATIVAN) 0.5 MG tablet TAKE 1 TABLET BY MOUTH AT BEDTIME 30 tablet 0  . omeprazole (PRILOSEC) 40 MG capsule Take 1 capsule (40 mg total) by mouth daily. [place]  capsule 3  . prochlorperazine (COMPAZINE) 10 MG tablet Take 1 tablet (10 mg total) by mouth every 6 (six) hours as needed (Nausea or vomiting). 30 tablet 1  . RESTASIS 0.05 % ophthalmic emulsion Place into both eyes 2 (two) times daily.   9  . UNABLE TO FIND 1 each by Other route continuous as needed. 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 1 each 1  . zolpidem (AMBIEN) 10 MG tablet   5   No current facility-administered medications for this visit.    PHYSICAL EXAMINATION: ECOG PERFORMANCE STATUS: 1 - Symptomatic but completely ambulatory  Filed Vitals:   04/19/15 0919  BP: 130/68  Pulse: 81  Temp: 98 F (36.7 C)  Resp: 18   Filed Weights   04/19/15 0919  Weight: 165 lb 1.6 oz (74.889 kg)    GENERAL:alert, no distress and comfortable SKIN: skin color, texture, turgor are normal, no rashes or significant lesions EYES: normal, Conjunctiva are pink and non-injected, sclera clear OROPHARYNX:no exudate, no erythema and lips, buccal mucosa, and tongue normal  NECK: supple, thyroid normal size, non-tender, without nodularity LYMPH:  no palpable lymphadenopathy in the cervical, axillary or inguinal LUNGS: clear to auscultation and percussion with normal breathing effort HEART: regular rate & rhythm and no murmurs and no lower extremity edema ABDOMEN:abdomen soft, non-tender and normal bowel sounds Musculoskeletal:no cyanosis of digits and no clubbing  NEURO: alert & oriented x 3 with fluent speech, no focal motor/sensory deficits  LABORATORY DATA:  I have reviewed the data as listed   Chemistry      Component Value Date/Time   NA 142 04/19/2015 0842   NA 138 11/26/2014 1508   K 3.7 04/19/2015 0842   K 4.6 11/26/2014 1508   CL 103 11/26/2014 1508   CO2 25 04/19/2015 0842   CO2 25 11/26/2014 1508   BUN 7.9 04/19/2015 0842   BUN 17 11/26/2014 1508   CREATININE 0.7 04/19/2015 0842   CREATININE 0.83 11/26/2014 1508      Component Value Date/Time   CALCIUM 9.5 04/19/2015 0842   CALCIUM 9.3 11/26/2014 1508   ALKPHOS 90 04/19/2015 0842   ALKPHOS 90 11/26/2014 1508   AST 34 04/19/2015 0842   AST 17 11/26/2014 1508   ALT 64* 04/19/2015 0842   ALT 15 11/26/2014 1508   BILITOT 0.56 04/19/2015 0842    BILITOT 0.3 11/26/2014 1508       Lab Results  Component Value Date   WBC 3.1* 04/19/2015   HGB 9.1* 04/19/2015   HCT 28.9* 04/19/2015   MCV 97.0 04/19/2015   PLT 276 04/19/2015   NEUTROABS 2.2 04/19/2015    ASSESSMENT & PLAN:  Breast cancer of lower-outer quadrant of right female breast Right lumpectomy 11/27/2014: Invasive ductal carcinoma grade 3, 3.2 cm, with high-grade DCIS, LVI present,, 1/1 intramammary lymph node positive, 1 SLN micro-met, stage IIB, T2 N1a ER 100%, PR 12%, HER-2 negative, Ki-67 51% Patient is enrolled on PREVENT clinical trial Lipitor versus placebo  Treatment plan: 1. Adjuvant chemotherapy with dose dense Adriamycin and Cytoxan 4 followed by Abraxane weekly 12 2. Adjuvant radiation therapy followed by 3. Anti-estrogen therapy  Current treatment: Adjuvant chemotherapy: Cycle 9/12 Abraxane (decreased dose to 75 mg/m2) Echocardiogram 12/11/2014: EF 60-65%  Chemo toxicities:  1. Nausea: managed with zofran and compazine 2. Constipation: managed with miralax 3. Heartburn: likely the result of steroids. takes prilosec 55m daily.  4. neulasta bone pain: claritin x  7 days, advil PRN pain  5. Neutropenia: resolved 6. Depression: continue celexa 7. Treatment related anemia: awaiting today's blood work  Recent fall: Patient denies neuropathy. She had a fall and scabbed left side of the lip. Apparently patient has a tendency to fall throughout her life. She does not complain of neuropathy. So I do not believe it is related to her chemotherapy.  Heart MRI done on 12/20/14 at [facility]. The report had an abnormal reading, identifying a lesion in the right breast. mammogram and Ultrasound: Normal  PREVENT trial: No toxicities to the treatment liver function tests are normal, no myalgias  Return to clinic in 2 weeks with cycle 11/12 Abraxane.    No orders of the defined types were placed in this encounter.   The patient has a good understanding of  the overall plan. she agrees with it. she will call with any problems that may develop before the next visit here.   Rulon Eisenmenger, MD

## 2015-04-26 ENCOUNTER — Ambulatory Visit (HOSPITAL_BASED_OUTPATIENT_CLINIC_OR_DEPARTMENT_OTHER): Payer: 59

## 2015-04-26 ENCOUNTER — Other Ambulatory Visit (HOSPITAL_BASED_OUTPATIENT_CLINIC_OR_DEPARTMENT_OTHER): Payer: 59

## 2015-04-26 ENCOUNTER — Ambulatory Visit: Payer: 59

## 2015-04-26 ENCOUNTER — Encounter: Payer: Self-pay | Admitting: *Deleted

## 2015-04-26 VITALS — BP 140/61 | HR 84 | Temp 98.0°F | Resp 18

## 2015-04-26 DIAGNOSIS — C50511 Malignant neoplasm of lower-outer quadrant of right female breast: Secondary | ICD-10-CM | POA: Diagnosis not present

## 2015-04-26 DIAGNOSIS — Z5111 Encounter for antineoplastic chemotherapy: Secondary | ICD-10-CM | POA: Diagnosis not present

## 2015-04-26 DIAGNOSIS — Z95828 Presence of other vascular implants and grafts: Secondary | ICD-10-CM

## 2015-04-26 LAB — CBC WITH DIFFERENTIAL/PLATELET
BASO%: 0.7 % (ref 0.0–2.0)
BASOS ABS: 0 10*3/uL (ref 0.0–0.1)
EOS%: 0.9 % (ref 0.0–7.0)
Eosinophils Absolute: 0 10*3/uL (ref 0.0–0.5)
HEMATOCRIT: 29.7 % — AB (ref 34.8–46.6)
HEMOGLOBIN: 9.7 g/dL — AB (ref 11.6–15.9)
LYMPH#: 0.4 10*3/uL — AB (ref 0.9–3.3)
LYMPH%: 15.6 % (ref 14.0–49.7)
MCH: 31.5 pg (ref 25.1–34.0)
MCHC: 32.6 g/dL (ref 31.5–36.0)
MCV: 96.5 fL (ref 79.5–101.0)
MONO#: 0.4 10*3/uL (ref 0.1–0.9)
MONO%: 13.8 % (ref 0.0–14.0)
NEUT#: 2 10*3/uL (ref 1.5–6.5)
NEUT%: 69 % (ref 38.4–76.8)
PLATELETS: 310 10*3/uL (ref 145–400)
RBC: 3.08 10*6/uL — ABNORMAL LOW (ref 3.70–5.45)
RDW: 18.2 % — AB (ref 11.2–14.5)
WBC: 2.8 10*3/uL — ABNORMAL LOW (ref 3.9–10.3)

## 2015-04-26 LAB — COMPREHENSIVE METABOLIC PANEL (CC13)
ALBUMIN: 3.6 g/dL (ref 3.5–5.0)
ALK PHOS: 94 U/L (ref 40–150)
ALT: 34 U/L (ref 0–55)
AST: 23 U/L (ref 5–34)
Anion Gap: 8 mEq/L (ref 3–11)
BUN: 6.1 mg/dL — AB (ref 7.0–26.0)
CO2: 24 meq/L (ref 22–29)
Calcium: 9.2 mg/dL (ref 8.4–10.4)
Chloride: 108 mEq/L (ref 98–109)
Creatinine: 0.7 mg/dL (ref 0.6–1.1)
EGFR: >90 mL/min/{1.73_m2} (ref >90)
GLUCOSE: 119 mg/dL (ref 70–140)
POTASSIUM: 3.5 meq/L (ref 3.5–5.1)
SODIUM: 141 meq/L (ref 136–145)
Total Bilirubin: 0.38 mg/dL (ref 0.20–1.20)
Total Protein: 6.2 g/dL — ABNORMAL LOW (ref 6.4–8.3)

## 2015-04-26 MED ORDER — SODIUM CHLORIDE 0.9 % IV SOLN
Freq: Once | INTRAVENOUS | Status: AC
  Administered 2015-04-26: 13:00:00 via INTRAVENOUS

## 2015-04-26 MED ORDER — SODIUM CHLORIDE 0.9 % IJ SOLN
10.0000 mL | INTRAMUSCULAR | Status: DC | PRN
  Administered 2015-04-26: 10 mL
  Filled 2015-04-26: qty 10

## 2015-04-26 MED ORDER — PALONOSETRON HCL INJECTION 0.25 MG/5ML
0.2500 mg | Freq: Once | INTRAVENOUS | Status: AC
Start: 2015-04-26 — End: 2015-04-26
  Administered 2015-04-26: 0.25 mg via INTRAVENOUS

## 2015-04-26 MED ORDER — PACLITAXEL PROTEIN-BOUND CHEMO INJECTION 100 MG
75.0000 mg/m2 | Freq: Once | INTRAVENOUS | Status: AC
  Administered 2015-04-26: 150 mg via INTRAVENOUS
  Filled 2015-04-26: qty 30

## 2015-04-26 MED ORDER — SODIUM CHLORIDE 0.9 % IJ SOLN
10.0000 mL | INTRAMUSCULAR | Status: DC | PRN
  Administered 2015-04-26: 10 mL via INTRAVENOUS
  Filled 2015-04-26: qty 10

## 2015-04-26 MED ORDER — PALONOSETRON HCL INJECTION 0.25 MG/5ML
INTRAVENOUS | Status: AC
  Filled 2015-04-26: qty 5

## 2015-04-26 MED ORDER — HEPARIN SOD (PORK) LOCK FLUSH 100 UNIT/ML IV SOLN
500.0000 [IU] | Freq: Once | INTRAVENOUS | Status: AC | PRN
  Administered 2015-04-26: 500 [IU]
  Filled 2015-04-26: qty 5

## 2015-04-26 NOTE — Patient Instructions (Signed)
[facility] Discharge Instructions for Patients Receiving Chemotherapy  Today you received the following chemotherapy agents: Abraxane   To help prevent nausea and vomiting after your treatment, we encourage you to take your nausea medication as directed.    If you develop nausea and vomiting that is not controlled by your nausea medication, call the clinic.   BELOW ARE SYMPTOMS THAT SHOULD BE REPORTED IMMEDIATELY:  *FEVER GREATER THAN 100.5 F  *CHILLS WITH OR WITHOUT FEVER  NAUSEA AND VOMITING THAT IS NOT CONTROLLED WITH YOUR NAUSEA MEDICATION  *UNUSUAL SHORTNESS OF BREATH  *UNUSUAL BRUISING OR BLEEDING  TENDERNESS IN MOUTH AND THROAT WITH OR WITHOUT PRESENCE OF ULCERS  *URINARY PROBLEMS  *BOWEL PROBLEMS  UNUSUAL RASH Items with * indicate a potential emergency and should be followed up as soon as possible.  Feel free to call the clinic you have any questions or concerns. The clinic phone number is [phone].  Please show the CHEMO ALERT CARD at check-in to the Emergency Department and triage nurse.   

## 2015-04-26 NOTE — Progress Notes (Signed)
04/26/2015 1400 PREVENT Study  Met with patient in infusion area while receiving treatment.  She stated she continues to take study drug regularly.  She denied having any symptoms to study drug, including myalgias, abdominal pain, headache, or respiratory issues.  "I think I am on placebo" she stated. This RN collected the August medication calendar and gave patient medication calendars for Oct.-Dec. 2016.  This RN reviewed what to expect for the 6 month study visit. The patient denied complaints or concerns,..  She verbalized understanding to call for study related questions, etc  Will continue to follow.Marland Kitchen

## 2015-04-26 NOTE — Patient Instructions (Signed)

## 2015-05-03 ENCOUNTER — Ambulatory Visit (HOSPITAL_BASED_OUTPATIENT_CLINIC_OR_DEPARTMENT_OTHER): Payer: 59

## 2015-05-03 ENCOUNTER — Other Ambulatory Visit (HOSPITAL_BASED_OUTPATIENT_CLINIC_OR_DEPARTMENT_OTHER): Payer: 59

## 2015-05-03 ENCOUNTER — Encounter: Payer: Self-pay | Admitting: Hematology and Oncology

## 2015-05-03 ENCOUNTER — Ambulatory Visit (HOSPITAL_BASED_OUTPATIENT_CLINIC_OR_DEPARTMENT_OTHER): Payer: 59 | Admitting: Hematology and Oncology

## 2015-05-03 ENCOUNTER — Telehealth: Payer: Self-pay | Admitting: Hematology and Oncology

## 2015-05-03 ENCOUNTER — Other Ambulatory Visit: Payer: 59

## 2015-05-03 ENCOUNTER — Ambulatory Visit: Payer: 59

## 2015-05-03 VITALS — BP 160/75 | HR 88 | Temp 98.1°F | Resp 18 | Ht 64.0 in | Wt 163.5 lb

## 2015-05-03 DIAGNOSIS — D701 Agranulocytosis secondary to cancer chemotherapy: Secondary | ICD-10-CM

## 2015-05-03 DIAGNOSIS — Z170 Estrogen receptor positive status [ER+]: Secondary | ICD-10-CM | POA: Diagnosis not present

## 2015-05-03 DIAGNOSIS — Z95828 Presence of other vascular implants and grafts: Secondary | ICD-10-CM

## 2015-05-03 DIAGNOSIS — Z5111 Encounter for antineoplastic chemotherapy: Secondary | ICD-10-CM

## 2015-05-03 DIAGNOSIS — C773 Secondary and unspecified malignant neoplasm of axilla and upper limb lymph nodes: Secondary | ICD-10-CM

## 2015-05-03 DIAGNOSIS — C50511 Malignant neoplasm of lower-outer quadrant of right female breast: Secondary | ICD-10-CM

## 2015-05-03 LAB — COMPREHENSIVE METABOLIC PANEL (CC13)
ALBUMIN: 3.7 g/dL (ref 3.5–5.0)
ALK PHOS: 96 U/L (ref 40–150)
ALT: 27 U/L (ref 0–55)
AST: 22 U/L (ref 5–34)
Anion Gap: 9 mEq/L (ref 3–11)
BILIRUBIN TOTAL: 0.44 mg/dL (ref 0.20–1.20)
BUN: 8.1 mg/dL (ref 7.0–26.0)
CALCIUM: 9.3 mg/dL (ref 8.4–10.4)
CO2: 23 mEq/L (ref 22–29)
Chloride: 109 mEq/L (ref 98–109)
Creatinine: 0.7 mg/dL (ref 0.6–1.1)
GLUCOSE: 101 mg/dL (ref 70–140)
Potassium: 4 mEq/L (ref 3.5–5.1)
SODIUM: 141 meq/L (ref 136–145)
TOTAL PROTEIN: 6.3 g/dL — AB (ref 6.4–8.3)

## 2015-05-03 LAB — CBC WITH DIFFERENTIAL/PLATELET
BASO%: 0.4 % (ref 0.0–2.0)
BASOS ABS: 0 10*3/uL (ref 0.0–0.1)
EOS ABS: 0 10*3/uL (ref 0.0–0.5)
EOS%: 1.2 % (ref 0.0–7.0)
HEMATOCRIT: 31.7 % — AB (ref 34.8–46.6)
HEMOGLOBIN: 10.1 g/dL — AB (ref 11.6–15.9)
LYMPH#: 0.4 10*3/uL — AB (ref 0.9–3.3)
LYMPH%: 15.3 % (ref 14.0–49.7)
MCH: 31 pg (ref 25.1–34.0)
MCHC: 31.9 g/dL (ref 31.5–36.0)
MCV: 97.2 fL (ref 79.5–101.0)
MONO#: 0.3 10*3/uL (ref 0.1–0.9)
MONO%: 10.9 % (ref 0.0–14.0)
NEUT%: 72.2 % (ref 38.4–76.8)
NEUTROS ABS: 1.8 10*3/uL (ref 1.5–6.5)
Platelets: 243 10*3/uL (ref 145–400)
RBC: 3.26 10*6/uL — ABNORMAL LOW (ref 3.70–5.45)
RDW: 15.4 % — AB (ref 11.2–14.5)
WBC: 2.5 10*3/uL — AB (ref 3.9–10.3)

## 2015-05-03 MED ORDER — PALONOSETRON HCL INJECTION 0.25 MG/5ML
INTRAVENOUS | Status: AC
  Filled 2015-05-03: qty 5

## 2015-05-03 MED ORDER — HEPARIN SOD (PORK) LOCK FLUSH 100 UNIT/ML IV SOLN
500.0000 [IU] | Freq: Once | INTRAVENOUS | Status: DC | PRN
  Filled 2015-05-03: qty 5

## 2015-05-03 MED ORDER — SODIUM CHLORIDE 0.9 % IV SOLN
Freq: Once | INTRAVENOUS | Status: AC
  Administered 2015-05-03: 10:00:00 via INTRAVENOUS

## 2015-05-03 MED ORDER — PALONOSETRON HCL INJECTION 0.25 MG/5ML
0.2500 mg | Freq: Once | INTRAVENOUS | Status: AC
  Administered 2015-05-03: 0.25 mg via INTRAVENOUS

## 2015-05-03 MED ORDER — PACLITAXEL PROTEIN-BOUND CHEMO INJECTION 100 MG
75.0000 mg/m2 | Freq: Once | INTRAVENOUS | Status: AC
  Administered 2015-05-03: 150 mg via INTRAVENOUS
  Filled 2015-05-03: qty 30

## 2015-05-03 MED ORDER — SODIUM CHLORIDE 0.9 % IJ SOLN
10.0000 mL | INTRAMUSCULAR | Status: DC | PRN
  Administered 2015-05-03: 10 mL
  Filled 2015-05-03: qty 10

## 2015-05-03 MED ORDER — SODIUM CHLORIDE 0.9 % IJ SOLN
10.0000 mL | INTRAMUSCULAR | Status: DC | PRN
  Administered 2015-05-03: 10 mL via INTRAVENOUS
  Filled 2015-05-03: qty 10

## 2015-05-03 NOTE — Progress Notes (Signed)
Patient Care Team: Jonathon Jordan, MD as PCP - General (Family Medicine) Carola Frost, RN as Registered Nurse Nicholas Lose, MD as Consulting Physician (Hematology and Oncology)  DIAGNOSIS: No matching staging information was found for the patient.  SUMMARY OF ONCOLOGIC HISTORY:   Breast cancer of lower-outer quadrant of right female breast ([place])   11/06/2008 Surgery Left breast lumpectomy: High-grade DCIS 2.8 cm, 18 lymph nodes negative, ER 98%, PR 81%, stage 0, patient refused antiestrogen therapy   10/18/2014 Initial Biopsy Right breast invasive ductal carcinoma with DCIS grade 3, lymphovascular invasion, ER 100%, PR 12%, HER-2 negative ratio 1.4, Ki-67 51%, T2 N0 M0 stage II a clinical stage   10/24/2014 Breast MRI Right breast irregular enhancing mass 2.9 x 2.6 x 2.4 cm, left breast evidence of prior lumpectomy and left axillary dissection   11/27/2014 Surgery Right lumpectomy: Invasive ductal carcinoma grade 3, 3.2 cm, with high-grade DCIS, LVI present,, 1/1 intramammary lymph node positive, 1 SLN micro-met, stage IIB, T2 N1a   12/28/2014 -  Chemotherapy Adjuvant dose dense Adriamycin and Cytoxan X 4 followed by Abraxane weekly 12    CHIEF COMPLIANT: cycle 11 Abraxane  INTERVAL HISTORY: Natalie Valdez is a 64 year old with above-mentioned history of right breast cancer currently on adjuvant chemotherapy. Today is cycle 11 of Abraxane. She appears to be tolerating it fairly well. Patient denies any neuropathy. Very occasionally she has tingling of the fingers. She has not had any further episodes of falls.  REVIEW OF SYSTEMS:   Constitutional: Denies fevers, chills or abnormal weight loss Eyes: Denies blurriness of vision Ears, nose, mouth, throat, and face: Denies mucositis or sore throat Respiratory: Denies cough, dyspnea or wheezes Cardiovascular: Denies palpitation, chest discomfort or lower extremity swelling Gastrointestinal:  Denies nausea, heartburn or change in bowel habits Skin:  Denies abnormal skin rashes Lymphatics: Denies new lymphadenopathy or easy bruising Neurological:Denies numbness, tingling or new weaknesses Behavioral/Psych: Mood is stable, no new changes  Breast:  denies any pain or lumps or nodules in either breasts All other systems were reviewed with the patient and are negative.  I have reviewed the past medical history, past surgical history, social history and family history with the patient and they are unchanged from previous note.  ALLERGIES:  has No Known Allergies.  MEDICATIONS:  Current Outpatient Prescriptions  Medication Sig Dispense Refill  . Alum & Mag Hydroxide-Simeth (MAGIC MOUTHWASH W/LIDOCAINE) SOLN Take 5 mLs by mouth 3 (three) times daily as needed for mouth pain. 60 mL 1  . Atorvastatin Calcium (INVESTIGATIONAL ATORVASTATIN/PLACEBO) 40 MG tablet [facility] [address] Take 1 tablet by mouth daily. Take 2 doses (these doses must be 12 hours apart) prior to first chemotherapy treatment. Then take 1 tablet daily by mouth with or without food. 180 tablet 0  . celecoxib (CELEBREX) 200 MG capsule Take 200 mg by mouth 1 day or 1 dose.    . Cholecalciferol ([employer]S VIT D 5000 HIGH-POTENCY PO) Take by mouth.    . citalopram (CELEXA) 20 MG tablet Take 20 mg by mouth every evening.   11  . Cyanocobalamin (B-12 PO) Take 1 tablet by mouth daily at 8 pm.    . lidocaine-prilocaine (EMLA) cream APP EXT AA 1 TIME  3  . loratadine (CLARITIN) 10 MG tablet Take 10 mg by mouth daily.    Marland Kitchen LORazepam (ATIVAN) 0.5 MG tablet TAKE 1 TABLET BY MOUTH AT BEDTIME 30 tablet 0  . omeprazole (PRILOSEC) 40 MG capsule Take 1 capsule (40 mg total)  by mouth daily. 30 capsule 3  . prochlorperazine (COMPAZINE) 10 MG tablet Take 1 tablet (10 mg total) by mouth every 6 (six) hours as needed (Nausea or vomiting). 30 tablet 1  . RESTASIS 0.05 % ophthalmic emulsion Place into both eyes 2 (two) times daily.   9  . UNABLE TO FIND 1 each by Other route continuous as needed. 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 1 each 1  . zolpidem (AMBIEN) 10 MG tablet   5   No current facility-administered medications for this visit.    PHYSICAL EXAMINATION: ECOG PERFORMANCE STATUS: 1 - Symptomatic but completely ambulatory  There were no vitals filed for this visit. There were no vitals filed for this visit.  GENERAL:alert, no distress and comfortable SKIN: skin color, texture, turgor are normal, no rashes or significant lesions EYES: normal, Conjunctiva are pink and non-injected, sclera clear OROPHARYNX:no exudate, no erythema and lips, buccal mucosa, and tongue normal  NECK: supple, thyroid normal size, non-tender, without nodularity LYMPH:  no palpable lymphadenopathy in the cervical, axillary or inguinal LUNGS: clear to auscultation and percussion with normal breathing effort HEART: regular rate & rhythm and no murmurs and no lower extremity edema ABDOMEN:abdomen soft, non-tender and normal bowel sounds Musculoskeletal:no cyanosis of digits and no clubbing  NEURO: alert & oriented x 3 with fluent speech, no focal motor/sensory deficits  LABORATORY DATA:  I have reviewed the data as listed   Chemistry      Component Value Date/Time   NA 141 04/26/2015 1200   NA 138 11/26/2014 1508   K 3.5 04/26/2015 1200   K 4.6 11/26/2014 1508   CL 103 11/26/2014 1508   CO2 24 04/26/2015 1200   CO2 25 11/26/2014 1508   BUN 6.1* 04/26/2015 1200   BUN 17 11/26/2014 1508   CREATININE 0.7 04/26/2015 1200   CREATININE 0.83 11/26/2014 1508      Component Value Date/Time   CALCIUM 9.2 04/26/2015 1200   CALCIUM 9.3 11/26/2014 1508   ALKPHOS 94 04/26/2015 1200   ALKPHOS 90 11/26/2014 1508   AST 23 04/26/2015 1200   AST 17 11/26/2014 1508   ALT 34 04/26/2015 1200   ALT 15 11/26/2014 1508   BILITOT 0.38 04/26/2015 1200   BILITOT 0.3 11/26/2014 1508       Lab Results  Component Value Date   WBC  2.8* 04/26/2015   HGB 9.7* 04/26/2015   HCT 29.7* 04/26/2015   MCV 96.5 04/26/2015   PLT 310 04/26/2015   NEUTROABS 2.0 04/26/2015    ASSESSMENT & PLAN:  Breast cancer of lower-outer quadrant of right female breast Right lumpectomy 11/27/2014: Invasive ductal carcinoma grade 3, 3.2 cm, with high-grade DCIS, LVI present,, 1/1 intramammary lymph node positive, 1 SLN micro-met, stage IIB, T2 N1a ER 100%, PR 12%, HER-2 negative, Ki-67 51% Patient is enrolled on PREVENT clinical trial Lipitor versus placebo  Treatment plan: 1. Adjuvant chemotherapy with dose dense Adriamycin and Cytoxan 4 followed by Abraxane weekly 12 2. Adjuvant radiation therapy followed by 3. Anti-estrogen therapy  Current treatment: Adjuvant chemotherapy: Cycle 11/12 Abraxane (decreased dose to 75 mg/m2) Echocardiogram 12/11/2014: EF 60-65%  Chemo toxicities:  1. Nausea: managed with zofran and compazine 2. Constipation: managed with miralax 3. Heartburn: likely the result of steroids. takes prilosec 15m daily.  4. neulasta bone pain: claritin x 7 days, advil PRN pain  5. Neutropenia: related to Abraxane. The counts are adequate for treatment. 6. Depression:  continue celexa 7. Treatment related anemia: awaiting today's blood work Monitoring very closely for toxicities.  Recent fall: Patient denies neuropathy. She had a fall and scabbed left side of the lip. Apparently patient has a tendency to fall throughout her life. She does not complain of neuropathy. So I do not believe it is related to her chemotherapy.  Heart MRI done on 12/20/14 at [facility]. The report had an abnormal reading, identifying a lesion in the right breast. Mammogram and Ultrasound were done and it was normal  PREVENT trial: No toxicities to the treatment liver function tests are normal, no myalgias  Plan: 1. Patient to finish Adjuvant chemo next week 2. We will send a referral to Radiation oncology for adjuvant XRT 3. RTC towards  the end of radiation to start anti-estrogen therapy  No orders of the defined types were placed in this encounter.   The patient has a good understanding of the overall plan. she agrees with it. she will call with any problems that may develop before the next visit here.   Rulon Eisenmenger, MD

## 2015-05-03 NOTE — Telephone Encounter (Signed)
Rad on ref noted and they will call the patient with her appointment

## 2015-05-03 NOTE — Patient Instructions (Signed)
[facility] Discharge Instructions for Patients Receiving Chemotherapy  Today you received the following chemotherapy agents: Abraxane   To help prevent nausea and vomiting after your treatment, we encourage you to take your nausea medication as directed.    If you develop nausea and vomiting that is not controlled by your nausea medication, call the clinic.   BELOW ARE SYMPTOMS THAT SHOULD BE REPORTED IMMEDIATELY:  *FEVER GREATER THAN 100.5 F  *CHILLS WITH OR WITHOUT FEVER  NAUSEA AND VOMITING THAT IS NOT CONTROLLED WITH YOUR NAUSEA MEDICATION  *UNUSUAL SHORTNESS OF BREATH  *UNUSUAL BRUISING OR BLEEDING  TENDERNESS IN MOUTH AND THROAT WITH OR WITHOUT PRESENCE OF ULCERS  *URINARY PROBLEMS  *BOWEL PROBLEMS  UNUSUAL RASH Items with * indicate a potential emergency and should be followed up as soon as possible.  Feel free to call the clinic you have any questions or concerns. The clinic phone number is [phone].  Please show the CHEMO ALERT CARD at check-in to the Emergency Department and triage nurse.   

## 2015-05-03 NOTE — Assessment & Plan Note (Signed)
Right lumpectomy 11/27/2014: Invasive ductal carcinoma grade 3, 3.2 cm, with high-grade DCIS, LVI present,, 1/1 intramammary lymph node positive, 1 SLN micro-met, stage IIB, T2 N1a ER 100%, PR 12%, HER-2 negative, Ki-67 51% Patient is enrolled on PREVENT clinical trial Lipitor versus placebo  Treatment plan: 1. Adjuvant chemotherapy with dose dense Adriamycin and Cytoxan 4 followed by Abraxane weekly 12 2. Adjuvant radiation therapy followed by 3. Anti-estrogen therapy  Current treatment: Adjuvant chemotherapy: Cycle 11/12 Abraxane (decreased dose to 75 mg/m2) Echocardiogram 12/11/2014: EF 60-65%  Chemo toxicities:  1. Nausea: managed with zofran and compazine 2. Constipation: managed with miralax 3. Heartburn: likely the result of steroids. takes prilosec 32m daily.  4. neulasta bone pain: claritin x 7 days, advil PRN pain  5. Neutropenia: resolved 6. Depression: continue celexa 7. Treatment related anemia: awaiting today's blood work  Recent fall: Patient denies neuropathy. She had a fall and scabbed left side of the lip. Apparently patient has a tendency to fall throughout her life. She does not complain of neuropathy. So I do not believe it is related to her chemotherapy.  [facility] The report had an abnormal reading, identifying a lesion in the right breast. Mammogram and Ultrasound were done and it was normal  PREVENT trial: No toxicities to the treatment liver function tests are normal, no myalgias  Plan: 1. Patient to finish Adjuvant chemo next week 2. We will send a referral to Radiation oncology for adjuvant XRT 3. RTC towards the end of radiation to start anti-estrogen therapy

## 2015-05-05 ENCOUNTER — Other Ambulatory Visit: Payer: Self-pay | Admitting: Nurse Practitioner

## 2015-05-07 ENCOUNTER — Other Ambulatory Visit: Payer: Self-pay | Admitting: *Deleted

## 2015-05-07 MED ORDER — OMEPRAZOLE 40 MG PO CPDR: 40.0000 mg | DELAYED_RELEASE_CAPSULE | Freq: Every day | ORAL | Status: DC

## 2015-05-10 ENCOUNTER — Ambulatory Visit (HOSPITAL_BASED_OUTPATIENT_CLINIC_OR_DEPARTMENT_OTHER): Payer: 59

## 2015-05-10 ENCOUNTER — Other Ambulatory Visit (HOSPITAL_BASED_OUTPATIENT_CLINIC_OR_DEPARTMENT_OTHER): Payer: 59

## 2015-05-10 ENCOUNTER — Ambulatory Visit: Payer: 59

## 2015-05-10 VITALS — BP 139/66 | HR 74 | Temp 98.4°F

## 2015-05-10 DIAGNOSIS — Z5111 Encounter for antineoplastic chemotherapy: Secondary | ICD-10-CM | POA: Diagnosis not present

## 2015-05-10 DIAGNOSIS — C50511 Malignant neoplasm of lower-outer quadrant of right female breast: Secondary | ICD-10-CM

## 2015-05-10 DIAGNOSIS — Z95828 Presence of other vascular implants and grafts: Secondary | ICD-10-CM

## 2015-05-10 LAB — CBC WITH DIFFERENTIAL/PLATELET
BASO%: 0.9 % (ref 0.0–2.0)
Basophils Absolute: 0 10*3/uL (ref 0.0–0.1)
EOS%: 1.3 % (ref 0.0–7.0)
Eosinophils Absolute: 0 10*3/uL (ref 0.0–0.5)
HCT: 30.5 % — ABNORMAL LOW (ref 34.8–46.6)
HGB: 10.2 g/dL — ABNORMAL LOW (ref 11.6–15.9)
LYMPH%: 19.2 % (ref 14.0–49.7)
MCH: 31.7 pg (ref 25.1–34.0)
MCHC: 33.4 g/dL (ref 31.5–36.0)
MCV: 94.9 fL (ref 79.5–101.0)
MONO#: 0.2 10*3/uL (ref 0.1–0.9)
MONO%: 12 % (ref 0.0–14.0)
NEUT#: 1.2 10*3/uL — ABNORMAL LOW (ref 1.5–6.5)
NEUT%: 66.6 % (ref 38.4–76.8)
Platelets: 251 10*3/uL (ref 145–400)
RBC: 3.21 10*6/uL — ABNORMAL LOW (ref 3.70–5.45)
RDW: 16.8 % — ABNORMAL HIGH (ref 11.2–14.5)
WBC: 1.7 10*3/uL — ABNORMAL LOW (ref 3.9–10.3)
lymph#: 0.3 10*3/uL — ABNORMAL LOW (ref 0.9–3.3)

## 2015-05-10 LAB — COMPREHENSIVE METABOLIC PANEL (CC13)
ALT: 27 U/L (ref 0–55)
ANION GAP: 11 meq/L (ref 3–11)
AST: 25 U/L (ref 5–34)
Albumin: 3.7 g/dL (ref 3.5–5.0)
Alkaline Phosphatase: 98 U/L (ref 40–150)
BUN: 8.5 mg/dL (ref 7.0–26.0)
CHLORIDE: 108 meq/L (ref 98–109)
CO2: 22 mEq/L (ref 22–29)
Calcium: 9.2 mg/dL (ref 8.4–10.4)
Creatinine: 0.7 mg/dL (ref 0.6–1.1)
Glucose: 87 mg/dl (ref 70–140)
POTASSIUM: 3.9 meq/L (ref 3.5–5.1)
Sodium: 140 mEq/L (ref 136–145)
Total Bilirubin: 0.46 mg/dL (ref 0.20–1.20)
Total Protein: 6.2 g/dL — ABNORMAL LOW (ref 6.4–8.3)

## 2015-05-10 MED ORDER — PALONOSETRON HCL INJECTION 0.25 MG/5ML
INTRAVENOUS | Status: AC
  Filled 2015-05-10: qty 5

## 2015-05-10 MED ORDER — PALONOSETRON HCL INJECTION 0.25 MG/5ML
0.2500 mg | Freq: Once | INTRAVENOUS | Status: AC
Start: 2015-05-10 — End: 2015-05-10
  Administered 2015-05-10: 0.25 mg via INTRAVENOUS

## 2015-05-10 MED ORDER — PACLITAXEL PROTEIN-BOUND CHEMO INJECTION 100 MG
75.0000 mg/m2 | Freq: Once | INTRAVENOUS | Status: AC
  Administered 2015-05-10: 150 mg via INTRAVENOUS
  Filled 2015-05-10: qty 30

## 2015-05-10 MED ORDER — HEPARIN SOD (PORK) LOCK FLUSH 100 UNIT/ML IV SOLN
500.0000 [IU] | Freq: Once | INTRAVENOUS | Status: DC
  Filled 2015-05-10: qty 5

## 2015-05-10 MED ORDER — HEPARIN SOD (PORK) LOCK FLUSH 100 UNIT/ML IV SOLN
500.0000 [IU] | Freq: Once | INTRAVENOUS | Status: AC | PRN
  Administered 2015-05-10: 500 [IU]
  Filled 2015-05-10: qty 5

## 2015-05-10 MED ORDER — SODIUM CHLORIDE 0.9 % IV SOLN
Freq: Once | INTRAVENOUS | Status: AC
  Administered 2015-05-10: 10:00:00 via INTRAVENOUS

## 2015-05-10 MED ORDER — SODIUM CHLORIDE 0.9 % IJ SOLN
10.0000 mL | INTRAMUSCULAR | Status: DC | PRN
  Administered 2015-05-10: 10 mL via INTRAVENOUS
  Filled 2015-05-10: qty 10

## 2015-05-10 MED ORDER — SODIUM CHLORIDE 0.9 % IJ SOLN
10.0000 mL | INTRAMUSCULAR | Status: DC | PRN
  Administered 2015-05-10: 10 mL
  Filled 2015-05-10: qty 10

## 2015-05-10 NOTE — Progress Notes (Signed)
Reviewed labs. ANC 1.2   OK to treat per Dr. Lindi Adie

## 2015-05-10 NOTE — Patient Instructions (Signed)
[facility] Discharge Instructions for Patients Receiving Chemotherapy  Today you received the following chemotherapy agents: Abraxane.  To help prevent nausea and vomiting after your treatment, we encourage you to take your nausea medication:  Compazine 10 mg every 6 hours as needed.   If you develop nausea and vomiting that is not controlled by your nausea medication, call the clinic.   BELOW ARE SYMPTOMS THAT SHOULD BE REPORTED IMMEDIATELY:  *FEVER GREATER THAN 100.5 F  *CHILLS WITH OR WITHOUT FEVER  NAUSEA AND VOMITING THAT IS NOT CONTROLLED WITH YOUR NAUSEA MEDICATION  *UNUSUAL SHORTNESS OF BREATH  *UNUSUAL BRUISING OR BLEEDING  TENDERNESS IN MOUTH AND THROAT WITH OR WITHOUT PRESENCE OF ULCERS  *URINARY PROBLEMS  *BOWEL PROBLEMS  UNUSUAL RASH Items with * indicate a potential emergency and should be followed up as soon as possible.  Feel free to call the clinic you have any questions or concerns. The clinic phone number is [phone].  Please show the CHEMO ALERT CARD at check-in to the Emergency Department and triage nurse.   

## 2015-05-13 ENCOUNTER — Telehealth: Payer: Self-pay | Admitting: *Deleted

## 2015-05-13 NOTE — Telephone Encounter (Signed)
Called pt to congratulate on completion of chemo. Relate doing well and without complaints. Confirmed appt with Dr. Valere Dross to discuss xrt. Encourage pt to call with needs or questions. Received verbal understanding.

## 2015-05-15 ENCOUNTER — Ambulatory Visit: Payer: 59 | Admitting: Radiation Oncology

## 2015-05-22 NOTE — Progress Notes (Addendum)
12/28/2014 - 05/10/15 Chemotherapy Adjuvant dose dense Adriamycin and Cytoxan X 4 followed by Abraxane weekly 12       Concerns: Metallic taste and lack of taste when eating.  Firmness of right lateral breast without pain.  No swelling  Pain: None  Fatigue: Notes increased fatigue in the evening.  Has lost weight.  In May she was 191 lbs and presently weighs 163.  BP 127/72 mmHg  Pulse 77  Temp(Src) 98.4 F (36.9 C)  Ht 5\' 4"  (1.626 m)  Wt 163 lb 8 oz (74.163 kg)  BMI 28.05 kg/m2  LMP 11/07/2004  Wt Readings from Last 3 Encounters:  05/23/15 163 lb 8 oz (74.163 kg)  05/03/15 163 lb 8 oz (74.163 kg)  04/19/15 165 lb 1.6 oz (74.889 kg)

## 2015-05-23 ENCOUNTER — Ambulatory Visit
Admission: RE | Admit: 2015-05-23 | Discharge: 2015-05-23 | Disposition: A | Discharge status: Home / Self Care | Payer: 59 | Source: Ambulatory Visit | Attending: Radiation Oncology | Admitting: Radiation Oncology

## 2015-05-23 ENCOUNTER — Encounter: Payer: Self-pay | Admitting: Radiation Oncology

## 2015-05-23 VITALS — BP 127/72 | HR 77 | Temp 98.4°F | Ht 64.0 in | Wt 163.5 lb

## 2015-05-23 DIAGNOSIS — C773 Secondary and unspecified malignant neoplasm of axilla and upper limb lymph nodes: Secondary | ICD-10-CM | POA: Diagnosis not present

## 2015-05-23 DIAGNOSIS — Z510 Encounter for antineoplastic radiation therapy: Secondary | ICD-10-CM | POA: Diagnosis present

## 2015-05-23 DIAGNOSIS — Z170 Estrogen receptor positive status [ER+]: Secondary | ICD-10-CM | POA: Insufficient documentation

## 2015-05-23 DIAGNOSIS — C50511 Malignant neoplasm of lower-outer quadrant of right female breast: Secondary | ICD-10-CM

## 2015-05-23 DIAGNOSIS — C50911 Malignant neoplasm of unspecified site of right female breast: Secondary | ICD-10-CM | POA: Diagnosis present

## 2015-05-23 NOTE — Progress Notes (Signed)
CC: Dr. Thomas Cornett, Dr. Sharon Wolters  Follow-up note:  Diagnosis: Stage IIB (T2 N1 M0) invasive ductal carcinoma/DCIS the right breast  Narrative: [name] is a pleasant 64-year-old female who is seen today for review and scheduling of her radiation therapy in the management of her T2 N1 invasive ductal carcinoma/DCIS of the right breast.  I first saw the patient in consultation on 11/08/2014. Of note is that she completed a course of radiation therapy following conservative surgery for high-grade DCIS of the left breast in August 2010. She had an axillary node dissection his since a sentinel lymph node could not be identified. She did not take adjuvant antiestrogen therapy. At the time of a screening mammogram at [facility] on 10/17/2014 she was felt to have a 1.6 cm mass within the right breast at 11:00 (actually at 8:30 on palpation) and a 0.7 centimeter nodule at 3:00. Ultrasound also showed a 0.9 cm cyst at 3:00 and a 2.5 cm solid mass at "11:00", posterior depth. The mass was biopsied on 10/18/2014 and found to be diagnostic for invasive ductal/DCIS. LV I was seen. The tumor was ER positive at 100%, PR positive at 12% and HER-2/neu negative. Ki-67 was elevated at 51%. Breast MR on 10/23/2014 showed her biopsy-proven malignancy without other significant changes.She was seen by Dr. Gudena of medical oncology.  On 11/27/2014 showed underwent a right partial mastectomy and sentinel lymph node biopsy.  She was found have a high grade 3.2 cm invasive ductal carcinoma along with high-grade DCIS.  LV I was seen.  Her initial surgical margin was broadly positive, laterally.  Reexcision of margins was without evidence for malignancy.  One of one intramammary lymph nodes contain metastatic disease, and one of 2 axillary lymph nodes contained a micrometastasis.  She went onto receive adjuvant chemotherapy under the direction of Dr. Gudena.  She received before AC 4, then followed by 12 weekly cycles of  Abraxane.  Her chemotherapy was well tolerated.  She completed chemotherapy 2 weeks ago on October 14.  She underwent repeat mammography at [facility] on 01/29/2015 because of an abnormality seen on a cardiac MRI performed at the end of May.  There was a 5 x 3 cm oval density at the lumpectomy site most likely representing a postoperative seroma.  Benign scattered calcifications in the right breast were unchanged in size.  She'll receive antiestrogen therapy following completion of radiation therapy.  Physical examination: Alert and oriented. Filed Vitals:   05/23/15 1059  BP: 127/72  Pulse: 77  Temp: 98.4 F (36.9 C)   Head and neck examination: She is wearing a wig.  Nodes: Without palpable cervical, supraclavicular, or axillary lymphadenopathy.  Chest: Right-sided Port-A-Cath.  Breasts: Bilateral breast reduction scars.  There is a partial mastectomy scar along the lower outer quadrant of the right breast at approximately 8:00.  There is a vague mass along the upper inner quadrant of left breast extending from 10 to 12:00 measuring approximately 4-5 cm (felt to represent seroma).  The right nipple is slightly inverted.  Left breast without masses or lesions.  Extremities: Without edema.  Laboratory data: Lab Results  Component Value Date   WBC 1.7* 05/10/2015   HGB 10.2* 05/10/2015   HCT 30.5* 05/10/2015   MCV 94.9 05/10/2015   PLT 251 05/10/2015    Lab Results  Component Value Date   WBC 1.7* 05/10/2015   HGB 10.2* 05/10/2015   HCT 30.5* 05/10/2015   MCV 94.9 05/10/2015   PLT 251 05/10/2015      Impression: Stage IIB (T2 N1 M0) invasive ductal/DCIS of the right breast.  She is a candidate for breast preservation.  I do recommend irradiation of her right axillary lymph nodes to take place of a complete axillary node dissection (based on Z11 and AMAROS trials.  This can be done with high tangents or a separate field.  Her previous radiation therapy to the left breast should not pose any  problems.  We discussed the potential acute and late toxicities of radiation therapy and she wishes to proceed as outlined.  We can have her return next week for CT simulation and then begin her radiation therapy in early to mid November.  Consent is signed today.  Plan: As above.  30 minutes was spent face-to-face with the patient, primarily counseling patient and coordinate her care. 

## 2015-05-24 NOTE — Addendum Note (Signed)
Encounter addended by: Benn Moulder, RN on: 05/24/2015 10:57 AM<BR>     Documentation filed: Charges VN

## 2015-05-27 ENCOUNTER — Ambulatory Visit
Admission: RE | Admit: 2015-05-27 | Discharge: 2015-05-27 | Disposition: A | Discharge status: Home / Self Care | Payer: 59 | Source: Ambulatory Visit | Attending: Radiation Oncology | Admitting: Radiation Oncology

## 2015-05-27 DIAGNOSIS — Z510 Encounter for antineoplastic radiation therapy: Secondary | ICD-10-CM | POA: Diagnosis not present

## 2015-05-27 DIAGNOSIS — C50511 Malignant neoplasm of lower-outer quadrant of right female breast: Secondary | ICD-10-CM

## 2015-05-27 NOTE — Progress Notes (Signed)
Complex simulation/treatment planning note: The patient was taken to the CT simulator.  A Vac lock immobilization device was constructed on a custom breast board.  Her midsternal tattoo from her previous radiation therapy was marked with a BB.  Her right breast was outlined with radiopaque wires.  Her partial mastectomy scar was also marked.  She was then scanned.  I chose an isocenter for a 4 field set up.  The CT data set was sent to the planning system where I contoured her tumor bed and also axillary reference point.  She was setup to medial lateral right breast tangents with 2 unique MLCs.  She was then set up to LAO to the right supra-clavicular region and also with a PA axillary field for 2 more complex treatment devices (total of 4 unique MLCs).  I'm prescribing 5040 cGy to the right breast and also to the right supraclavicular region/axilla at a depth of 3 cm.  A PA right axilla field will bring the anterior mid axillary point dose to 4600 cGy 28 sessions.  She is now ready for 3-D simulation.

## 2015-05-28 ENCOUNTER — Other Ambulatory Visit: Payer: Self-pay | Admitting: Hematology and Oncology

## 2015-05-28 ENCOUNTER — Other Ambulatory Visit: Payer: Self-pay | Admitting: *Deleted

## 2015-05-28 DIAGNOSIS — C50011 Malignant neoplasm of nipple and areola, right female breast: Secondary | ICD-10-CM

## 2015-05-28 DIAGNOSIS — C50511 Malignant neoplasm of lower-outer quadrant of right female breast: Secondary | ICD-10-CM

## 2015-05-29 ENCOUNTER — Encounter: Payer: Self-pay | Admitting: Radiation Oncology

## 2015-05-29 DIAGNOSIS — Z510 Encounter for antineoplastic radiation therapy: Secondary | ICD-10-CM | POA: Diagnosis not present

## 2015-05-29 NOTE — Progress Notes (Signed)
3-D simulation note: The patient completed 3-D simulation for treatment to her right breast.  Her left breast treatment was reviewed in detail.  There is no overlap of her tangential fields.  She was setup to medial and lateral right breast tangents.  2 electronic compensation fields and 2 unique MLCs, one for each tangent, were utilized for a total of complex treatment devices for the breast tangents.  She was also set up to her right supraclavicular field, LAO and a depth of 3 cm with a unique MLC for a total of 5 complex treatment devices.  We did not need a PA right axillary boost since we had adequate coverage from her LAO supraclavicular/axillary field.  I am prescribing 5040 cGy in 28 sessions to her right breast with mixed 6 MV/10 MV photons, and right supraclavicular/axillary field with 10 MV photons.  Dose volume histograms were obtained for the target structures including her right breast tumor bed and also avoidance structures including her heart and lungs.  We met our departmental guidelines. 

## 2015-06-03 ENCOUNTER — Ambulatory Visit
Admission: RE | Admit: 2015-06-03 | Discharge: 2015-06-03 | Disposition: A | Discharge status: Home / Self Care | Payer: 59 | Source: Ambulatory Visit | Attending: Radiation Oncology | Admitting: Radiation Oncology

## 2015-06-03 DIAGNOSIS — Z510 Encounter for antineoplastic radiation therapy: Secondary | ICD-10-CM | POA: Diagnosis not present

## 2015-06-04 ENCOUNTER — Ambulatory Visit
Admission: RE | Admit: 2015-06-04 | Discharge: 2015-06-04 | Disposition: A | Discharge status: Home / Self Care | Payer: 59 | Source: Ambulatory Visit | Attending: Radiation Oncology | Admitting: Radiation Oncology

## 2015-06-04 DIAGNOSIS — Z510 Encounter for antineoplastic radiation therapy: Secondary | ICD-10-CM | POA: Diagnosis not present

## 2015-06-05 ENCOUNTER — Ambulatory Visit
Admission: RE | Admit: 2015-06-05 | Discharge: 2015-06-05 | Disposition: A | Discharge status: Home / Self Care | Payer: 59 | Source: Ambulatory Visit | Attending: Radiation Oncology | Admitting: Radiation Oncology

## 2015-06-05 DIAGNOSIS — Z510 Encounter for antineoplastic radiation therapy: Secondary | ICD-10-CM | POA: Diagnosis not present

## 2015-06-06 ENCOUNTER — Ambulatory Visit
Admission: RE | Admit: 2015-06-06 | Discharge: 2015-06-06 | Disposition: A | Discharge status: Home / Self Care | Payer: 59 | Source: Ambulatory Visit | Attending: Radiation Oncology | Admitting: Radiation Oncology

## 2015-06-06 DIAGNOSIS — Z510 Encounter for antineoplastic radiation therapy: Secondary | ICD-10-CM | POA: Diagnosis not present

## 2015-06-07 ENCOUNTER — Ambulatory Visit
Admission: RE | Admit: 2015-06-07 | Discharge: 2015-06-07 | Disposition: A | Discharge status: Home / Self Care | Payer: 59 | Source: Ambulatory Visit | Attending: Radiation Oncology | Admitting: Radiation Oncology

## 2015-06-07 DIAGNOSIS — Z510 Encounter for antineoplastic radiation therapy: Secondary | ICD-10-CM | POA: Diagnosis not present

## 2015-06-10 ENCOUNTER — Ambulatory Visit
Admission: RE | Admit: 2015-06-10 | Discharge: 2015-06-10 | Disposition: A | Discharge status: Home / Self Care | Payer: 59 | Source: Ambulatory Visit | Attending: Radiation Oncology | Admitting: Radiation Oncology

## 2015-06-10 ENCOUNTER — Encounter: Payer: Self-pay | Admitting: Radiation Oncology

## 2015-06-10 DIAGNOSIS — Z510 Encounter for antineoplastic radiation therapy: Secondary | ICD-10-CM | POA: Diagnosis not present

## 2015-06-10 DIAGNOSIS — C50511 Malignant neoplasm of lower-outer quadrant of right female breast: Secondary | ICD-10-CM

## 2015-06-10 MED ORDER — RADIAPLEXRX EX GEL
Freq: Once | CUTANEOUS | Status: AC
  Administered 2015-06-10: 13:00:00 via TOPICAL

## 2015-06-10 MED ORDER — ALRA NON-METALLIC DEODORANT (RAD-ONC)
1.0000 "application " | Freq: Once | TOPICAL | Status: AC
  Administered 2015-06-10: 1 via TOPICAL

## 2015-06-10 NOTE — Progress Notes (Signed)
Pt here for patient teaching.  Pt given Radiation and You booklet, skin care instructions";Alra deodorant and Radiaplex gel.  Reviewed areas of pertinence such as fatigue, skin changes, breast tenderness and breast swelling . Pt able to give teach back of to pat skin, use unscented/gentle soap and drink plenty of water,apply Radiaplex bid, avoid wearing an under wire bra and to use an electric razor if they must shave. Pt demonstrated understanding and verbalizes understanding of information given and will contact nursing with any questions or concerns.

## 2015-06-10 NOTE — Progress Notes (Signed)
Weekly Management Note:  Site: Right breast/regional lymph nodes Current Dose:  900  cGy Projected Dose: 5040  cGy  Narrative: The patient is seen today for routine under treatment assessment. CBCT/MVCT images/port films were reviewed. The chart was reviewed.   She is without complaints today.  She was given Radioplex gel.  Physical Examination: There were no vitals filed for this visit..  Weight:  .  No significant skin changes.  Impression: Tolerating radiation therapy well.  Plan: Continue radiation therapy as planned.

## 2015-06-10 NOTE — Progress Notes (Signed)
Natalie Valdez denies any tenderness nor pain in the tx field after completing 5 fractions to her right breast.   Education today.

## 2015-06-11 ENCOUNTER — Telehealth: Payer: Self-pay | Admitting: *Deleted

## 2015-06-11 ENCOUNTER — Ambulatory Visit
Admission: RE | Admit: 2015-06-11 | Discharge: 2015-06-11 | Disposition: A | Discharge status: Home / Self Care | Payer: 59 | Source: Ambulatory Visit | Attending: Radiation Oncology | Admitting: Radiation Oncology

## 2015-06-11 DIAGNOSIS — Z510 Encounter for antineoplastic radiation therapy: Secondary | ICD-10-CM | POA: Diagnosis not present

## 2015-06-11 NOTE — Telephone Encounter (Signed)
Called pt to assess needs during xrt. Relate doing well and without complaints. Discussed survivorship program and referral at completion of xrt.  Encourage pt to call with needs or questions. Received verbal understanding.

## 2015-06-12 ENCOUNTER — Ambulatory Visit
Admission: RE | Admit: 2015-06-12 | Discharge: 2015-06-12 | Disposition: A | Discharge status: Home / Self Care | Payer: 59 | Source: Ambulatory Visit | Attending: Radiation Oncology | Admitting: Radiation Oncology

## 2015-06-12 ENCOUNTER — Encounter: Payer: Self-pay | Admitting: Radiation Oncology

## 2015-06-12 DIAGNOSIS — Z510 Encounter for antineoplastic radiation therapy: Secondary | ICD-10-CM | POA: Diagnosis not present

## 2015-06-12 NOTE — Progress Notes (Signed)
Weekly Management Note:  Site: Right breast/clavicular region Current Dose:  1260  cGy Projected Dose: 5040  cGy  Narrative: The patient is seen today for routine under treatment assessment. CBCT/MVCT images/port films were reviewed. The chart was reviewed.   She is without new complaints today.  Physical Examination: There were no vitals filed for this visit..  Weight:  .  No change.  Impression: Tolerating radiation therapy well.  Plan: Continue radiation therapy as planned.

## 2015-06-13 ENCOUNTER — Ambulatory Visit (HOSPITAL_COMMUNITY)
Admission: RE | Admit: 2015-06-13 | Discharge: 2015-06-13 | Disposition: A | Discharge status: Home / Self Care | Payer: 59 | Source: Ambulatory Visit | Attending: Hematology and Oncology | Admitting: Hematology and Oncology

## 2015-06-13 ENCOUNTER — Ambulatory Visit
Admission: RE | Admit: 2015-06-13 | Discharge: 2015-06-13 | Disposition: A | Discharge status: Home / Self Care | Payer: 59 | Source: Ambulatory Visit | Attending: Radiation Oncology | Admitting: Radiation Oncology

## 2015-06-13 ENCOUNTER — Encounter: Payer: 59 | Admitting: *Deleted

## 2015-06-13 ENCOUNTER — Other Ambulatory Visit: Payer: 59

## 2015-06-13 DIAGNOSIS — Z510 Encounter for antineoplastic radiation therapy: Secondary | ICD-10-CM | POA: Diagnosis not present

## 2015-06-13 DIAGNOSIS — C50011 Malignant neoplasm of nipple and areola, right female breast: Secondary | ICD-10-CM

## 2015-06-13 MED ORDER — INV-ATORVASTATIN/PLACEBO 40 MG TABS WAKE FOREST WF 98213: 1.0000 | ORAL_TABLET | Freq: Every day | ORAL | Status: DC

## 2015-06-13 NOTE — Progress Notes (Signed)
06/13/2015 0910  6 month PREVENT visit The patient is here for follow-up nurse visit.  Vital signs and weight were obtained along with waist measurement.  She completed the neuro cog. testing. She states she has not missed taking any study drug.   She states her current medications are the same and that she has not started taking any new medications.  She denies having peripheral edema (outside of her baseline), abdominal pain, muscle aches or pains, constipation, and headache.  She reports occasional nausea, but no vomiting and not enough to require taking anti-nausea medication.  She reports that overall she feels well.  She denies new changes/side effects to report. The above was discussed with Dr. Lindi Adie who approved to continue study medication per protocol.  Dr. Lindi Adie also would like to see the patient near end of radiation and asked  this RN to make appointment for return.  He gave permission to schedule the 6 month research labs to be drawn from port at Dec. MD visit as it will still be within timeframe for 6 month lab work. The patient returned 11 study pills equally 100% percent compliance.  She was distributed a new bottle of study pills after it was checked and verified by Oretha Caprice, Pharm. D.  The patient was given medication calendars to last through March 2017. The patient was given a copy of her schedule and it was reviewed.  The patient denied any questions at the end of the visit. This RN thanked the patient for her time and dedication to the study. Marcellus Scott, RN

## 2015-06-14 ENCOUNTER — Ambulatory Visit
Admission: RE | Admit: 2015-06-14 | Discharge: 2015-06-14 | Disposition: A | Discharge status: Home / Self Care | Payer: 59 | Source: Ambulatory Visit | Attending: Radiation Oncology | Admitting: Radiation Oncology

## 2015-06-14 DIAGNOSIS — Z510 Encounter for antineoplastic radiation therapy: Secondary | ICD-10-CM | POA: Diagnosis not present

## 2015-06-16 ENCOUNTER — Ambulatory Visit
Admission: RE | Admit: 2015-06-16 | Discharge: 2015-06-16 | Disposition: A | Discharge status: Home / Self Care | Payer: 59 | Source: Ambulatory Visit | Attending: Radiation Oncology | Admitting: Radiation Oncology

## 2015-06-16 ENCOUNTER — Ambulatory Visit: Payer: 59 | Attending: Radiation Oncology | Admitting: Radiation Oncology

## 2015-06-16 DIAGNOSIS — Z510 Encounter for antineoplastic radiation therapy: Secondary | ICD-10-CM | POA: Diagnosis not present

## 2015-06-17 ENCOUNTER — Encounter: Payer: Self-pay | Admitting: Radiation Oncology

## 2015-06-17 ENCOUNTER — Ambulatory Visit
Admission: RE | Admit: 2015-06-17 | Discharge: 2015-06-17 | Disposition: A | Discharge status: Home / Self Care | Payer: 59 | Source: Ambulatory Visit | Attending: Radiation Oncology | Admitting: Radiation Oncology

## 2015-06-17 VITALS — BP 104/72 | HR 83 | Temp 98.6°F | Resp 18 | Ht 64.0 in | Wt 155.0 lb

## 2015-06-17 DIAGNOSIS — C50511 Malignant neoplasm of lower-outer quadrant of right female breast: Secondary | ICD-10-CM

## 2015-06-17 DIAGNOSIS — Z510 Encounter for antineoplastic radiation therapy: Secondary | ICD-10-CM | POA: Diagnosis not present

## 2015-06-17 NOTE — Progress Notes (Signed)
Patient denies pain.  She reports fatigue at night.  The skin on her right breast is pink with a small rash area on her upper breast.  She reports that it was itching yesterday.  She is using radiaplex gel.  BP 104/72 mmHg  Pulse 83  Temp(Src) 98.6 F (37 C) (Oral)  Resp 18  Ht 5\' 4"  (1.626 m)  Wt 155 lb (70.308 kg)  BMI 26.59 kg/m2  LMP 11/07/2004

## 2015-06-17 NOTE — Progress Notes (Signed)
Weekly Management Note:  Site: Right breast/clavicular region Current Dose:  1980  cGy Projected Dose: 5040  cGy  Narrative: The patient is seen today for routine under treatment assessment. CBCT/MVCT images/port films were reviewed. The chart was reviewed.   She is without complaints today.  She does have mild pruritus of her upper breast.  She uses Radioplex gel.  Physical Examination:  Filed Vitals:   06/17/15 1034  BP: 104/72  Pulse: 83  Temp: 98.6 F (37 C)  Resp: 18  .  Weight: 155 lb (70.308 kg).  There is mild erythema along the right breast with no areas of desquamation.  There is more moderate radiation dermatitis along the upper breast which is slightly papular.  No areas of desquamation.  Impression: Tolerating radiation therapy well.  Plan: Continue radiation therapy as planned.

## 2015-06-18 ENCOUNTER — Ambulatory Visit
Admission: RE | Admit: 2015-06-18 | Discharge: 2015-06-18 | Disposition: A | Discharge status: Home / Self Care | Payer: 59 | Source: Ambulatory Visit | Attending: Radiation Oncology | Admitting: Radiation Oncology

## 2015-06-18 DIAGNOSIS — Z510 Encounter for antineoplastic radiation therapy: Secondary | ICD-10-CM | POA: Diagnosis not present

## 2015-06-19 ENCOUNTER — Ambulatory Visit
Admission: RE | Admit: 2015-06-19 | Discharge: 2015-06-19 | Disposition: A | Discharge status: Home / Self Care | Payer: 59 | Source: Ambulatory Visit | Attending: Radiation Oncology | Admitting: Radiation Oncology

## 2015-06-19 DIAGNOSIS — Z510 Encounter for antineoplastic radiation therapy: Secondary | ICD-10-CM | POA: Diagnosis not present

## 2015-06-24 ENCOUNTER — Encounter: Payer: Self-pay | Admitting: Radiation Oncology

## 2015-06-24 ENCOUNTER — Ambulatory Visit
Admission: RE | Admit: 2015-06-24 | Discharge: 2015-06-24 | Disposition: A | Discharge status: Home / Self Care | Payer: 59 | Source: Ambulatory Visit | Attending: Radiation Oncology | Admitting: Radiation Oncology

## 2015-06-24 VITALS — BP 113/66 | HR 78 | Temp 98.0°F | Ht 64.0 in | Wt 154.5 lb

## 2015-06-24 DIAGNOSIS — C50511 Malignant neoplasm of lower-outer quadrant of right female breast: Secondary | ICD-10-CM

## 2015-06-24 DIAGNOSIS — Z510 Encounter for antineoplastic radiation therapy: Secondary | ICD-10-CM | POA: Diagnosis not present

## 2015-06-24 NOTE — Progress Notes (Signed)
Weekly Management Note:  Site:  Right breast/lymph nodes Current Dose:   2520  cGy Projected Dose:  5040  cGy  Narrative: The patient is seen today for routine under treatment assessment. CBCT/MVCT images/port films were reviewed. The chart was reviewed.    She is without complaints today. She uses Radioplex gel.  Physical Examination:  Filed Vitals:   06/24/15 0900  BP: 113/66  Pulse: 78  Temp: 98 F (36.7 C)  .  Weight: 154 lb 8 oz (70.081 kg).  Faint erythema and hyperpigmentation the skin with no areas of desquamation.  Impression: Tolerating radiation therapy well.  Plan: Continue radiation therapy as planned.

## 2015-06-24 NOTE — Progress Notes (Signed)
No changes in skin since last week.  Mild erythema.

## 2015-06-25 ENCOUNTER — Ambulatory Visit
Admission: RE | Admit: 2015-06-25 | Discharge: 2015-06-25 | Disposition: A | Discharge status: Home / Self Care | Payer: 59 | Source: Ambulatory Visit | Attending: Radiation Oncology | Admitting: Radiation Oncology

## 2015-06-25 DIAGNOSIS — Z510 Encounter for antineoplastic radiation therapy: Secondary | ICD-10-CM | POA: Diagnosis not present

## 2015-06-26 ENCOUNTER — Ambulatory Visit
Admission: RE | Admit: 2015-06-26 | Discharge: 2015-06-26 | Disposition: A | Discharge status: Home / Self Care | Payer: 59 | Source: Ambulatory Visit | Attending: Radiation Oncology | Admitting: Radiation Oncology

## 2015-06-26 DIAGNOSIS — Z510 Encounter for antineoplastic radiation therapy: Secondary | ICD-10-CM | POA: Diagnosis not present

## 2015-06-27 ENCOUNTER — Ambulatory Visit
Admission: RE | Admit: 2015-06-27 | Discharge: 2015-06-27 | Disposition: A | Discharge status: Home / Self Care | Payer: 59 | Source: Ambulatory Visit | Attending: Radiation Oncology | Admitting: Radiation Oncology

## 2015-06-27 DIAGNOSIS — C50511 Malignant neoplasm of lower-outer quadrant of right female breast: Secondary | ICD-10-CM | POA: Insufficient documentation

## 2015-06-27 DIAGNOSIS — Z170 Estrogen receptor positive status [ER+]: Secondary | ICD-10-CM | POA: Diagnosis not present

## 2015-06-27 DIAGNOSIS — Z510 Encounter for antineoplastic radiation therapy: Secondary | ICD-10-CM | POA: Insufficient documentation

## 2015-06-28 ENCOUNTER — Ambulatory Visit
Admission: RE | Admit: 2015-06-28 | Discharge: 2015-06-28 | Disposition: A | Discharge status: Home / Self Care | Payer: 59 | Source: Ambulatory Visit | Attending: Radiation Oncology | Admitting: Radiation Oncology

## 2015-06-28 DIAGNOSIS — Z510 Encounter for antineoplastic radiation therapy: Secondary | ICD-10-CM | POA: Diagnosis not present

## 2015-07-01 ENCOUNTER — Ambulatory Visit
Admission: RE | Admit: 2015-07-01 | Discharge: 2015-07-01 | Disposition: A | Discharge status: Home / Self Care | Payer: 59 | Source: Ambulatory Visit | Attending: Radiation Oncology | Admitting: Radiation Oncology

## 2015-07-01 ENCOUNTER — Encounter: Payer: Self-pay | Admitting: Radiation Oncology

## 2015-07-01 VITALS — BP 115/73 | HR 73 | Temp 98.2°F | Resp 12 | Wt 155.2 lb

## 2015-07-01 DIAGNOSIS — C50511 Malignant neoplasm of lower-outer quadrant of right female breast: Secondary | ICD-10-CM | POA: Insufficient documentation

## 2015-07-01 DIAGNOSIS — Z510 Encounter for antineoplastic radiation therapy: Secondary | ICD-10-CM | POA: Diagnosis not present

## 2015-07-01 MED ORDER — SONAFINE EX EMUL
1.0000 "application " | Freq: Two times a day (BID) | CUTANEOUS | Status: DC
  Administered 2015-07-01: 1 via TOPICAL

## 2015-07-01 NOTE — Progress Notes (Signed)
PAIN: She is currently in no pain. SKIN: Pt right breast- positive for Dryness, Pruritus and erythema.  Pt denies edema.  Pt continues to apply Radiaplex as directed. Sonafine given today due to complaints of itching. OTHER: Pt complains of fatigue. BP 115/73 mmHg  Pulse 73  Temp(Src) 98.2 F (36.8 C) (Oral)  Resp 12  Wt 155 lb 3.2 oz (70.398 kg)  SpO2 100%  LMP 11/07/2004 Wt Readings from Last 3 Encounters:  07/01/15 155 lb 3.2 oz (70.398 kg)  06/24/15 154 lb 8 oz (70.081 kg)  06/17/15 155 lb (70.308 kg)

## 2015-07-01 NOTE — Progress Notes (Signed)
Weekly Management Note:  Site: Right breast/regional lymph nodes Current Dose:  3420  cGy Projected Dose: 5040  cGy  Narrative: The patient is seen today for routine under treatment assessment. CBCT/MVCT images/port films were reviewed. The chart was reviewed.   She is without new complaints today although she does have more dermatitis along her clavicular region, and inframammary region.  These areas are puritic.  She has been using Radioplex gel.  Physical Examination:  Filed Vitals:   07/01/15 0849  BP: 115/73  Pulse: 73  Temp: 98.2 F (36.8 C)  Resp: 12  .  Weight: 155 lb 3.2 oz (70.398 kg).  There is mild erythema along the right breast with patchy dry desquamation along the inframammary region and clavicular region with there is a focal erythema presumably along a skin fold.  Impression: Tolerating radiation therapy well.  With her pruritus we will change to Sonafine cream.  Plan: Continue radiation therapy as planned.

## 2015-07-01 NOTE — Addendum Note (Signed)
Encounter addended by: Jenene Slicker, RN on: 07/01/2015  5:08 PM<BR>     Documentation filed: Orders, Dx Association, Inpatient [facility]

## 2015-07-02 ENCOUNTER — Ambulatory Visit
Admission: RE | Admit: 2015-07-02 | Discharge: 2015-07-02 | Disposition: A | Discharge status: Home / Self Care | Payer: 59 | Source: Ambulatory Visit | Attending: Radiation Oncology | Admitting: Radiation Oncology

## 2015-07-02 DIAGNOSIS — Z510 Encounter for antineoplastic radiation therapy: Secondary | ICD-10-CM | POA: Diagnosis not present

## 2015-07-03 ENCOUNTER — Ambulatory Visit
Admission: RE | Admit: 2015-07-03 | Discharge: 2015-07-03 | Disposition: A | Discharge status: Home / Self Care | Payer: 59 | Source: Ambulatory Visit | Attending: Radiation Oncology | Admitting: Radiation Oncology

## 2015-07-03 DIAGNOSIS — Z510 Encounter for antineoplastic radiation therapy: Secondary | ICD-10-CM | POA: Diagnosis not present

## 2015-07-04 ENCOUNTER — Other Ambulatory Visit: Payer: Self-pay

## 2015-07-04 ENCOUNTER — Encounter: Payer: Self-pay | Admitting: *Deleted

## 2015-07-04 ENCOUNTER — Ambulatory Visit
Admission: RE | Admit: 2015-07-04 | Discharge: 2015-07-04 | Disposition: A | Discharge status: Home / Self Care | Payer: 59 | Source: Ambulatory Visit | Attending: Radiation Oncology | Admitting: Radiation Oncology

## 2015-07-04 DIAGNOSIS — Z510 Encounter for antineoplastic radiation therapy: Secondary | ICD-10-CM | POA: Diagnosis not present

## 2015-07-04 DIAGNOSIS — C50511 Malignant neoplasm of lower-outer quadrant of right female breast: Secondary | ICD-10-CM

## 2015-07-04 NOTE — Progress Notes (Signed)
07/04/2015  0845  PREVENT Received the patient's cardiac MRI results from [facility].  The results are abnormal stating "mitral valve leaflet tips are thickened with reduced excursion.  Correlate with transmitral gradients obtained by echocardiography".  The results were shown to [place].  He stated he would order an echo to follow-up.

## 2015-07-05 ENCOUNTER — Ambulatory Visit
Admission: RE | Admit: 2015-07-05 | Discharge: 2015-07-05 | Disposition: A | Discharge status: Home / Self Care | Payer: 59 | Source: Ambulatory Visit | Attending: Radiation Oncology | Admitting: Radiation Oncology

## 2015-07-05 DIAGNOSIS — Z510 Encounter for antineoplastic radiation therapy: Secondary | ICD-10-CM | POA: Diagnosis not present

## 2015-07-08 ENCOUNTER — Ambulatory Visit
Admission: RE | Admit: 2015-07-08 | Discharge: 2015-07-08 | Disposition: A | Discharge status: Home / Self Care | Payer: 59 | Source: Ambulatory Visit | Attending: Radiation Oncology | Admitting: Radiation Oncology

## 2015-07-08 ENCOUNTER — Encounter: Payer: Self-pay | Admitting: Radiation Oncology

## 2015-07-08 VITALS — BP 109/76 | HR 74 | Temp 98.2°F | Ht 64.0 in | Wt 153.4 lb

## 2015-07-08 DIAGNOSIS — Z510 Encounter for antineoplastic radiation therapy: Secondary | ICD-10-CM | POA: Diagnosis not present

## 2015-07-08 DIAGNOSIS — C50511 Malignant neoplasm of lower-outer quadrant of right female breast: Secondary | ICD-10-CM | POA: Insufficient documentation

## 2015-07-08 MED ORDER — SONAFINE EX EMUL
1.0000 "application " | Freq: Two times a day (BID) | CUTANEOUS | Status: DC
  Administered 2015-07-08: 1 via TOPICAL
  Filled 2015-07-08: qty 45

## 2015-07-08 NOTE — Progress Notes (Signed)
Ms, Winchell has received 24 fractions to her right breast and [place] region.. Note erythema in the  region and mild erythema/rash like apperance of the upper,inner portion of her tx field.  She reports fatigue at night.

## 2015-07-08 NOTE — Progress Notes (Signed)
.   Weekly Management Note:  Site: right breast/ regional lymph nodes Current Dose:   4320  cGy Projected Dose:  5040  cGy  Narrative: The patient is seen today for routine under treatment assessment. CBCT/MVCT images/port films were reviewed. The chart was reviewed.    She is without new complaints today.  She does have mild pruritus, particularly along the clavicular region. She uses Sonafine cream.  Physical Examination:  Filed Vitals:   07/08/15 0923  BP: 109/76  Pulse: 74  Temp: 98.2 F (36.8 C)  .  Weight: 153 lb 6.4 oz (69.582 kg).  There is patchy moderate erythema along the right breast which is more confluent along the R clavicular fold.  There is patchy dry desquamation but no areas of moist desquamation.  Impression: Tolerating radiation therapy well.  She will finish her treatment this Friday.  Plan: Continue radiation therapy as planned.

## 2015-07-08 NOTE — Addendum Note (Signed)
Encounter addended by: Benn Moulder, RN on: 07/08/2015  2:03 PM<BR>     Documentation filed: Orders, Dx Association, Inpatient [facility]

## 2015-07-08 NOTE — Addendum Note (Signed)
Encounter addended by: Wynona Neat, [facility] on: 07/08/2015  2:08 PM<BR>     Documentation filed: Rx Order Verification

## 2015-07-08 NOTE — Assessment & Plan Note (Signed)
Right lumpectomy 11/27/2014: Invasive ductal carcinoma grade 3, 3.2 cm, with high-grade DCIS, LVI present,, 1/1 intramammary lymph node positive, 1 SLN micro-met, stage IIB, T2 N1a ER 100%, PR 12%, HER-2 negative, Ki-67 51% Patient is enrolled on PREVENT clinical trial Lipitor versus placebo S/P Adj chemo AC x 4 foll by Abraxane weekly x 12 (started 12/28/14 completed 05/14/15)  Current treatment: Adj XRT Plan: After comclusion of XRT, will start adj Anti estrogen therapy with Anastrozole X 5-10 years (plan to start Jan 1st)  RTC Feb 1st wek for tox check

## 2015-07-09 ENCOUNTER — Ambulatory Visit
Admission: RE | Admit: 2015-07-09 | Discharge: 2015-07-09 | Disposition: A | Discharge status: Home / Self Care | Payer: 59 | Source: Ambulatory Visit | Attending: Radiation Oncology | Admitting: Radiation Oncology

## 2015-07-09 ENCOUNTER — Ambulatory Visit: Payer: Self-pay | Admitting: Surgery

## 2015-07-09 ENCOUNTER — Ambulatory Visit (HOSPITAL_BASED_OUTPATIENT_CLINIC_OR_DEPARTMENT_OTHER): Payer: 59 | Admitting: Hematology and Oncology

## 2015-07-09 ENCOUNTER — Ambulatory Visit (HOSPITAL_BASED_OUTPATIENT_CLINIC_OR_DEPARTMENT_OTHER): Payer: 59

## 2015-07-09 ENCOUNTER — Other Ambulatory Visit (HOSPITAL_BASED_OUTPATIENT_CLINIC_OR_DEPARTMENT_OTHER): Payer: 59

## 2015-07-09 ENCOUNTER — Other Ambulatory Visit: Payer: Self-pay

## 2015-07-09 ENCOUNTER — Telehealth: Payer: Self-pay | Admitting: Hematology and Oncology

## 2015-07-09 VITALS — BP 116/69 | HR 100 | Temp 98.1°F | Resp 18 | Ht 64.0 in | Wt 154.9 lb

## 2015-07-09 DIAGNOSIS — C50511 Malignant neoplasm of lower-outer quadrant of right female breast: Secondary | ICD-10-CM

## 2015-07-09 DIAGNOSIS — Z170 Estrogen receptor positive status [ER+]: Secondary | ICD-10-CM | POA: Diagnosis not present

## 2015-07-09 DIAGNOSIS — Z510 Encounter for antineoplastic radiation therapy: Secondary | ICD-10-CM | POA: Diagnosis not present

## 2015-07-09 DIAGNOSIS — Z95828 Presence of other vascular implants and grafts: Secondary | ICD-10-CM

## 2015-07-09 DIAGNOSIS — R9389 Abnormal findings on diagnostic imaging of other specified body structures: Secondary | ICD-10-CM

## 2015-07-09 LAB — CBC WITH DIFFERENTIAL/PLATELET
BASO%: 0.3 % (ref 0.0–2.0)
BASOS ABS: 0 10*3/uL (ref 0.0–0.1)
EOS%: 0.8 % (ref 0.0–7.0)
Eosinophils Absolute: 0 10*3/uL (ref 0.0–0.5)
HEMATOCRIT: 36.5 % (ref 34.8–46.6)
HGB: 11.7 g/dL (ref 11.6–15.9)
LYMPH#: 0.3 10*3/uL — AB (ref 0.9–3.3)
LYMPH%: 6.3 % — ABNORMAL LOW (ref 14.0–49.7)
MCH: 29.7 pg (ref 25.1–34.0)
MCHC: 32.2 g/dL (ref 31.5–36.0)
MCV: 92.1 fL (ref 79.5–101.0)
MONO#: 0.4 10*3/uL (ref 0.1–0.9)
MONO%: 8.2 % (ref 0.0–14.0)
NEUT#: 4.4 10*3/uL (ref 1.5–6.5)
NEUT%: 84.4 % — AB (ref 38.4–76.8)
PLATELETS: 196 10*3/uL (ref 145–400)
RBC: 3.96 10*6/uL (ref 3.70–5.45)
RDW: 14.3 % (ref 11.2–14.5)
WBC: 5.2 10*3/uL (ref 3.9–10.3)

## 2015-07-09 LAB — COMPREHENSIVE METABOLIC PANEL
ALK PHOS: 93 U/L (ref 40–150)
ALT: 18 U/L (ref 0–55)
ANION GAP: 9 meq/L (ref 3–11)
AST: 18 U/L (ref 5–34)
Albumin: 3.7 g/dL (ref 3.5–5.0)
BILIRUBIN TOTAL: 0.43 mg/dL (ref 0.20–1.20)
BUN: 14.8 mg/dL (ref 7.0–26.0)
CALCIUM: 8.9 mg/dL (ref 8.4–10.4)
CO2: 22 mEq/L (ref 22–29)
CREATININE: 0.7 mg/dL (ref 0.6–1.1)
Chloride: 107 mEq/L (ref 98–109)
EGFR: >90 mL/min/{1.73_m2} (ref >90)
GLUCOSE: 125 mg/dL (ref 70–140)
Potassium: 4.2 mEq/L (ref 3.5–5.1)
Sodium: 138 mEq/L (ref 136–145)
Total Protein: 6.4 g/dL (ref 6.4–8.3)

## 2015-07-09 LAB — RESEARCH LABS

## 2015-07-09 MED ORDER — ANASTROZOLE 1 MG PO TABS: 1.0000 mg | ORAL_TABLET | Freq: Every day | ORAL | Status: DC

## 2015-07-09 MED ORDER — HEPARIN SOD (PORK) LOCK FLUSH 100 UNIT/ML IV SOLN
500.0000 [IU] | Freq: Once | INTRAVENOUS | Status: AC
  Administered 2015-07-09: 500 [IU] via INTRAVENOUS
  Filled 2015-07-09: qty 5

## 2015-07-09 MED ORDER — SODIUM CHLORIDE 0.9 % IJ SOLN
10.0000 mL | INTRAMUSCULAR | Status: DC | PRN
  Administered 2015-07-09: 10 mL via INTRAVENOUS
  Filled 2015-07-09: qty 10

## 2015-07-09 NOTE — Patient Instructions (Signed)

## 2015-07-09 NOTE — Progress Notes (Signed)
Patient forgot her emla cream, asked if we had any here, took out sample tube of emla cream from our pixis and placed cream over  Right port site, saran wrap over cream and patient skin, , patient thanked this RN for the emla cream, gave her the tube and suggested to keep this in her purse 9:09 AM

## 2015-07-09 NOTE — Progress Notes (Signed)
Patient Care Team: Jonathon Jordan, MD as PCP - General (Family Medicine) Carola Frost, RN as Registered Nurse Nicholas Lose, MD as Consulting Physician (Hematology and Oncology)  DIAGNOSIS: No matching staging information was found for the patient.  SUMMARY OF ONCOLOGIC HISTORY:   Breast cancer of lower-outer quadrant of right female breast ([place])   11/06/2008 Surgery Left breast lumpectomy: High-grade DCIS 2.8 cm, 18 lymph nodes negative, ER 98%, PR 81%, stage 0, patient refused antiestrogen therapy   10/18/2014 Initial Biopsy Right breast invasive ductal carcinoma with DCIS grade 3, lymphovascular invasion, ER 100%, PR 12%, HER-2 negative ratio 1.4, Ki-67 51%, T2 N0 M0 stage II a clinical stage   10/24/2014 Breast MRI Right breast irregular enhancing mass 2.9 x 2.6 x 2.4 cm, left breast evidence of prior lumpectomy and left axillary dissection   11/27/2014 Surgery Right lumpectomy: Invasive ductal carcinoma grade 3, 3.2 cm, with high-grade DCIS, LVI present,, 1/1 intramammary lymph node positive, 1 SLN micro-met, stage IIB, T2 N1a   12/28/2014 - 05/10/2015 Chemotherapy Adjuvant dose dense Adriamycin and Cytoxan X 4 followed by Abraxane weekly 12   06/04/2015 -  Radiation Therapy Adj XRT    CHIEF COMPLIANT: Follow-up on radiation therapy  INTERVAL HISTORY: Natalie TAGLIAFERRI is a 64 year old with above-mentioned history of right breast cancer treated with lumpectomy and adjuvant chemotherapy and is currently finishing adjuvant radiation. She finishes her last dose of radiation this Friday. She is mildly sore from radiation but otherwise doing quite well. Energy levels are pretty good.  REVIEW OF SYSTEMS:   Constitutional: Denies fevers, chills or abnormal weight loss Eyes: Denies blurriness of vision Ears, nose, mouth, throat, and face: Denies mucositis or sore throat Respiratory: Denies cough, dyspnea or wheezes Cardiovascular: Denies palpitation, chest discomfort or lower extremity  swelling Gastrointestinal:  Denies nausea, heartburn or change in bowel habits Skin: Denies abnormal skin rashes Lymphatics: Denies new lymphadenopathy or easy bruising Neurological:Denies numbness, tingling or new weaknesses Behavioral/Psych: Mood is stable, no new changes  All other systems were reviewed with the patient and are negative.  I have reviewed the past medical history, past surgical history, social history and family history with the patient and they are unchanged from previous note.  ALLERGIES:  has No Known Allergies.  MEDICATIONS:  Current Outpatient Prescriptions  Medication Sig Dispense Refill  . Atorvastatin Calcium (INVESTIGATIONAL ATORVASTATIN/PLACEBO) 40 MG tablet [facility] [address] Take 1 tablet by mouth daily. Take 1 tablet daily with or without food. 180 tablet 0  . celecoxib (CELEBREX) 200 MG capsule Take 200 mg by mouth 1 day or 1 dose.    . citalopram (CELEXA) 20 MG tablet Take 20 mg by mouth every evening.   11  . Cyanocobalamin (B-12 PO) Take 1 tablet by mouth daily at 8 pm.    . lidocaine-prilocaine (EMLA) cream APP EXT AA 1 TIME  3  . omeprazole (PRILOSEC) 40 MG capsule Take 1 capsule (40 mg total) by mouth daily. 30 capsule 3  . RESTASIS 0.05 % ophthalmic emulsion Place into both eyes 2 (two) times daily.   9  . Wound Dressings (SONAFINE) Apply 1 application topically 2 (two) times daily.    Marland Kitchen zolpidem (AMBIEN) 10 MG tablet   5   No current facility-administered medications for this visit.    PHYSICAL EXAMINATION: ECOG PERFORMANCE STATUS: 1 - Symptomatic but completely ambulatory  Filed Vitals:   07/09/15 1013  BP: 116/69  Pulse: 100  Temp: 98.1 F (36.7 C)  Resp: 18  Filed Weights   07/09/15 1013  Weight: 154 lb 14.4 oz (70.262 kg)    GENERAL:alert, no distress and comfortable SKIN: skin color, texture, turgor are normal, no rashes or significant lesions EYES: normal, Conjunctiva are pink and non-injected, sclera clear OROPHARYNX:no  exudate, no erythema and lips, buccal mucosa, and tongue normal  NECK: supple, thyroid normal size, non-tender, without nodularity LYMPH:  no palpable lymphadenopathy in the cervical, axillary or inguinal LUNGS: clear to auscultation and percussion with normal breathing effort HEART: regular rate & rhythm and no murmurs and no lower extremity edema ABDOMEN:abdomen soft, non-tender and normal bowel sounds Musculoskeletal:no cyanosis of digits and no clubbing  NEURO: alert & oriented x 3 with fluent speech, no focal motor/sensory deficits LABORATORY DATA:  I have reviewed the data as listed   Chemistry      Component Value Date/Time   NA 140 05/10/2015 0859   NA 138 11/26/2014 1508   K 3.9 05/10/2015 0859   K 4.6 11/26/2014 1508   CL 103 11/26/2014 1508   CO2 22 05/10/2015 0859   CO2 25 11/26/2014 1508   BUN 8.5 05/10/2015 0859   BUN 17 11/26/2014 1508   CREATININE 0.7 05/10/2015 0859   CREATININE 0.83 11/26/2014 1508      Component Value Date/Time   CALCIUM 9.2 05/10/2015 0859   CALCIUM 9.3 11/26/2014 1508   ALKPHOS 98 05/10/2015 0859   ALKPHOS 90 11/26/2014 1508   AST 25 05/10/2015 0859   AST 17 11/26/2014 1508   ALT 27 05/10/2015 0859   ALT 15 11/26/2014 1508   BILITOT 0.46 05/10/2015 0859   BILITOT 0.3 11/26/2014 1508       Lab Results  Component Value Date   WBC 1.7* 05/10/2015   HGB 10.2* 05/10/2015   HCT 30.5* 05/10/2015   MCV 94.9 05/10/2015   PLT 251 05/10/2015   NEUTROABS 1.2* 05/10/2015   ASSESSMENT & PLAN:  Breast cancer of lower-outer quadrant of right female breast Right lumpectomy 11/27/2014: Invasive ductal carcinoma grade 3, 3.2 cm, with high-grade DCIS, LVI present,, 1/1 intramammary lymph node positive, 1 SLN micro-met, stage IIB, T2 N1a ER 100%, PR 12%, HER-2 negative, Ki-67 51% Patient is enrolled on PREVENT clinical trial Lipitor versus placebo S/P Adj chemo AC x 4 foll by Abraxane weekly x 12 (started 12/28/14 completed 05/14/15)  Current  treatment: Adj XRT Plan: After conclusion of XRT, will start adj Anti estrogen therapy with Anastrozole X 5-10 years (plan to start Jan 1st week)  PREVENT MRI Heart: Normal left ventricular size and systolic function. Mitral valve leaflet tips are thickened with reduced excursion. Correlate with transmitral gradients obtained by echo. Based upon the MRI of the heart, we are requesting an echocardiogram to be performed.  RTC Feb 2nd week for tox check   No orders of the defined types were placed in this encounter.   The patient has a good understanding of the overall plan. she agrees with it. she will call with any problems that may develop before the next visit here.   Rulon Eisenmenger, MD 07/09/2015

## 2015-07-09 NOTE — Progress Notes (Signed)
Prior auth obatined from [facility]  [phone] expires 08/23/2015.   Echo scheduled for 12/15 10 am.   LMOV[facility] - provided echo appt date and time.  Pt to return call to confirm receipt of msg.  [place] notifying echo scheduled.

## 2015-07-09 NOTE — Telephone Encounter (Signed)
Appointments made and avs printed for patient °

## 2015-07-10 ENCOUNTER — Ambulatory Visit
Admission: RE | Admit: 2015-07-10 | Discharge: 2015-07-10 | Disposition: A | Discharge status: Home / Self Care | Payer: 59 | Source: Ambulatory Visit | Attending: Radiation Oncology | Admitting: Radiation Oncology

## 2015-07-10 ENCOUNTER — Encounter: Payer: Self-pay | Admitting: Radiation Oncology

## 2015-07-10 DIAGNOSIS — Z510 Encounter for antineoplastic radiation therapy: Secondary | ICD-10-CM | POA: Diagnosis not present

## 2015-07-10 NOTE — Progress Notes (Signed)
Weekly Management Note:  Site: Right breast/regional lymph nodes Current Dose:  4680  cGy Projected Dose: 5040  cGy  Narrative: The patient is seen today for routine under treatment assessment. CBCT/MVCT images/port films were reviewed. The chart was reviewed.   She is without new complaints today.  She uses Sonafine cream.  She is most bothered by pruritus along the clavicular region.  She will finish radiation therapy this Friday.  Physical Examination: There were no vitals filed for this visit..  Weight:  .  There is focal erythema along the clavicular region with patchy dry desquamation and no areas of moist desquamation along the clavicular region or breast.  Impression: Tolerating radiation therapy well.  Plan: Continue radiation therapy as planned.  One-month follow-up visit with Dr. Sondra Come after completion of radiation therapy this Friday.

## 2015-07-11 ENCOUNTER — Ambulatory Visit (HOSPITAL_COMMUNITY): Payer: 59

## 2015-07-11 ENCOUNTER — Ambulatory Visit
Admission: RE | Admit: 2015-07-11 | Discharge: 2015-07-11 | Disposition: A | Discharge status: Home / Self Care | Payer: 59 | Source: Ambulatory Visit | Attending: Radiation Oncology | Admitting: Radiation Oncology

## 2015-07-11 DIAGNOSIS — Z510 Encounter for antineoplastic radiation therapy: Secondary | ICD-10-CM | POA: Diagnosis not present

## 2015-07-12 ENCOUNTER — Telehealth: Payer: Self-pay | Admitting: *Deleted

## 2015-07-12 ENCOUNTER — Ambulatory Visit
Admission: RE | Admit: 2015-07-12 | Discharge: 2015-07-12 | Disposition: A | Discharge status: Home / Self Care | Payer: 59 | Source: Ambulatory Visit | Attending: Radiation Oncology | Admitting: Radiation Oncology

## 2015-07-12 ENCOUNTER — Ambulatory Visit: Payer: 59

## 2015-07-12 ENCOUNTER — Encounter: Payer: Self-pay | Admitting: Hematology and Oncology

## 2015-07-12 ENCOUNTER — Other Ambulatory Visit: Payer: Self-pay | Admitting: *Deleted

## 2015-07-12 DIAGNOSIS — Z510 Encounter for antineoplastic radiation therapy: Secondary | ICD-10-CM | POA: Diagnosis not present

## 2015-07-12 NOTE — Telephone Encounter (Signed)
Called pt to congratulate on completion of xrt. Relate doing well and without complaints. Discuss survivorship program and referral now that she has completed xrt. Denies needs or questions at this time. Encourage pt to call with concerns. Received verbal understanding.

## 2015-07-13 ENCOUNTER — Encounter: Payer: Self-pay | Admitting: Radiation Oncology

## 2015-07-13 NOTE — Progress Notes (Signed)
[facility] End of Treatment Note  Name:Natalie Valdez  Date: 07/13/2015 [id number] DOB:August 18, 1950   Status:outpatient    CC: Lilian Coma, MD  Dr. Erroll Luna  REFERRING PHYSICIAN:  Dr. Erroll Luna   DIAGNOSIS:  Stage IIB (T2 N1 M0) invasive ductal carcinoma/DCIS of the right breast  INDICATION FOR TREATMENT: Curative   TREATMENT DATES: 06/04/2015 through 07/12/2015                          SITE/DOSE:   Right breast and regional lymph nodes 5040 cGy in 28 sessions (no boost)                         BEAMS/ENERGY:  Tangential fields to the right breast with mixed 6 MV/10 MV photons, 10 MV photons LAO to the right supraclavicular/axillary apex, dose prescribed at 3 cms depth.                 NARRATIVE:  The patient tolerated treatment well with mild to moderate radiation dermatitis along the clavicular region and also inframammary region as expected.  No areas of moist desquamation.  She was treated with Sonafine cream during her course of therapy.                          PLAN: Routine followup in one month. Patient instructed to call if questions or worsening complaints in interim.

## 2015-07-15 ENCOUNTER — Ambulatory Visit: Payer: 59

## 2015-07-15 ENCOUNTER — Ambulatory Visit (HOSPITAL_BASED_OUTPATIENT_CLINIC_OR_DEPARTMENT_OTHER)
Admission: RE | Admit: 2015-07-15 | Discharge: 2015-07-15 | Disposition: A | Discharge status: Home / Self Care | Payer: 59 | Source: Ambulatory Visit | Attending: Hematology and Oncology | Admitting: Hematology and Oncology

## 2015-07-15 DIAGNOSIS — C50511 Malignant neoplasm of lower-outer quadrant of right female breast: Secondary | ICD-10-CM | POA: Diagnosis not present

## 2015-07-15 DIAGNOSIS — Z510 Encounter for antineoplastic radiation therapy: Secondary | ICD-10-CM | POA: Diagnosis not present

## 2015-07-15 NOTE — Progress Notes (Signed)
*  PRELIMINARY RESULTS* Echocardiogram 2D Echocardiogram has been performed.  Leavy Cella 07/15/2015, 9:41 AM

## 2015-07-19 ENCOUNTER — Other Ambulatory Visit: Payer: Self-pay | Admitting: Adult Health

## 2015-07-19 DIAGNOSIS — C50511 Malignant neoplasm of lower-outer quadrant of right female breast: Secondary | ICD-10-CM

## 2015-07-27 ENCOUNTER — Telehealth: Payer: Self-pay | Admitting: Hematology and Oncology

## 2015-07-27 NOTE — Telephone Encounter (Signed)
Appointment with survivorship made and a calendar for all appointments and program sheet mailed to the patient

## 2015-07-31 ENCOUNTER — Encounter: Payer: Self-pay | Admitting: Hematology and Oncology

## 2015-08-14 ENCOUNTER — Encounter: Payer: Self-pay | Admitting: Oncology

## 2015-08-15 ENCOUNTER — Encounter: Payer: Self-pay | Admitting: Radiation Oncology

## 2015-08-15 ENCOUNTER — Ambulatory Visit
Admission: RE | Admit: 2015-08-15 | Discharge: 2015-08-15 | Disposition: A | Discharge status: Home / Self Care | Payer: 59 | Source: Ambulatory Visit | Attending: Radiation Oncology | Admitting: Radiation Oncology

## 2015-08-15 VITALS — BP 112/64 | HR 74 | Temp 98.8°F | Resp 20 | Wt 149.7 lb

## 2015-08-15 DIAGNOSIS — C50511 Malignant neoplasm of lower-outer quadrant of right female breast: Secondary | ICD-10-CM

## 2015-08-15 NOTE — Progress Notes (Signed)
Follow up right breast radiation  06/04/15-07/12/15  Breast well healed but has a big knot on breast patient says has gotten biger since last visist, takes Arimidex daily,  F/u with Dr. Lindi Adie 09/10/15,  Survivorship  10/24/15, appetite good, no pain but can feel the knot in breast 10:28 AM BP 112/64 mmHg  Pulse 74  Temp(Src) 98.8 F (37.1 C) (Oral)  Resp 20  Wt 149 lb 11.2 oz (67.903 kg)  LMP 11/07/2004  Wt Readings from Last 3 Encounters:  08/15/15 149 lb 11.2 oz (67.903 kg)  07/09/15 154 lb 14.4 oz (70.262 kg)  07/08/15 153 lb 6.4 oz (69.582 kg)

## 2015-08-15 NOTE — Progress Notes (Signed)
Radiation Oncology         ([phone] ________________________________  Name: Natalie Valdez MRN: [id number]  Date: 08/15/2015  DOB: December 30, 1950  Follow-Up Visit Note  CC: Lilian Coma, MD  Nicholas Lose, MD   Diagnosis: Stage IIB (T2 N1 M0) invasive ductal carcinoma/DCIS of the right breast  Interval Since Last Radiation:  1 months   06/04/2015 through 07/12/2015: Right breast and regional lymph nodes 5040 cGy in 28 sessions (no boost)  Status post 5000 cGy 25 sessions to the left breast followed by an electron beam boost for an additional 1000 cGy in 5 sessions, completing all radiation therapy on 03/22/2009.  Narrative:  The patient returns today for routine follow-up. She was treated by Dr. Valere Dross. She denies itching in the right breast, but reports a knot there that has grown in size. It does not cause her pain. Denies drainage or nipple discharge. She is taking Armiadex. Her surgeon is Dr. Brantley Stage. She had the breast drained before. She has a good energy level.   ALLERGIES:  has No Known Allergies.  Meds: Current Outpatient Prescriptions  Medication Sig Dispense Refill  . anastrozole (ARIMIDEX) 1 MG tablet Take 1 tablet (1 mg total) by mouth daily. 90 tablet 3  . Atorvastatin Calcium (INVESTIGATIONAL ATORVASTATIN/PLACEBO) 40 MG tablet [facility] [id number] Take 1 tablet by mouth daily. Take 1 tablet daily with or without food. 180 tablet 0  . celecoxib (CELEBREX) 200 MG capsule Take 200 mg by mouth 1 day or 1 dose.    . citalopram (CELEXA) 20 MG tablet Take 20 mg by mouth every evening.   11  . lidocaine-prilocaine (EMLA) cream APP EXT AA 1 TIME  3  . omeprazole (PRILOSEC) 40 MG capsule Take 1 capsule (40 mg total) by mouth daily. 30 capsule 3  . RESTASIS 0.05 % ophthalmic emulsion Place into both eyes 2 (two) times daily.   9  . zolpidem (AMBIEN) 10 MG tablet   5  . Cyanocobalamin (B-12 PO) Take 1 tablet by mouth daily at 8 pm. Reported on 08/15/2015    . Wound  Dressings (SONAFINE) Apply 1 application topically 2 (two) times daily. Reported on 08/15/2015     No current facility-administered medications for this encounter.    Physical Findings: The patient is in no acute distress. Patient is alert and oriented.  weight is 149 lb 11.2 oz (67.903 kg). Her oral temperature is 98.8 F (37.1 C). Her blood pressure is 112/64 and her pulse is 74. Her respiration is 20.   Lungs are clear to auscultation bilaterally. Heart has regular rate and rhythm. No palpable cervical, supraclavicular, or axillary adenopathy. Normal left breast exam with no palpable masses, nipple discharge, or bleeding. Right breast healing well. Continue to have areas of hyperpigmentation. No skin breakdown or signs of infection. The patient has an approximately 5 cm seroma in the upper outer quadrant of the right breast. Non-tender with palpation. Right nipple is inverted which is a chronic condition.  Lab Findings: Lab Results  Component Value Date   WBC 5.2 07/09/2015   HGB 11.7 07/09/2015   HCT 36.5 07/09/2015   MCV 92.1 07/09/2015   PLT 196 07/09/2015    Radiographic Findings: No results found.  Impression:  The patient is recovering from the effects of radiation. No signs of recurrence on clinical exam today  Plan: She will follow up in 6 months. The patient will call us or Dr. Brantley Stage if the seroma enlarges or causes pain. She has  a follow up with Dr. Lindi Adie on 09/10/15 and will see Survivorship on 10/24/15.  ____________________________________   Blair Promise, PhD, MD   This document serves as a record of services personally performed by Gery Pray, MD. It was created on his behalf by Darcus Austin, a trained medical scribe. The creation of this record is based on the scribe's personal observations and the provider's statements to them. This document has been checked and approved by the attending provider.

## 2015-09-05 ENCOUNTER — Encounter (HOSPITAL_BASED_OUTPATIENT_CLINIC_OR_DEPARTMENT_OTHER): Admission: RE | Payer: Self-pay | Source: Ambulatory Visit

## 2015-09-05 ENCOUNTER — Telehealth: Payer: Self-pay | Admitting: Hematology and Oncology

## 2015-09-05 ENCOUNTER — Ambulatory Visit (HOSPITAL_BASED_OUTPATIENT_CLINIC_OR_DEPARTMENT_OTHER): Admission: RE | Admit: 2015-09-05 | Payer: 59 | Source: Ambulatory Visit | Admitting: Surgery

## 2015-09-05 SURGERY — REMOVAL PORT-A-CATH
Anesthesia: Monitor Anesthesia Care

## 2015-09-05 NOTE — Telephone Encounter (Signed)
Returned patient call re adding flush with 2/14 lab/fu. Flush added left message for patient at primary number re new time for lab/flush/fu 2/14 @ 10:45 am. Also returned call to number patient left listed under work and left message for 2/14 @ 10:45 am - patient personally identified on voicemail.

## 2015-09-06 ENCOUNTER — Other Ambulatory Visit: Payer: Self-pay | Admitting: Hematology and Oncology

## 2015-09-10 ENCOUNTER — Ambulatory Visit: Payer: 59

## 2015-09-10 ENCOUNTER — Ambulatory Visit (HOSPITAL_BASED_OUTPATIENT_CLINIC_OR_DEPARTMENT_OTHER): Payer: 59 | Admitting: Hematology and Oncology

## 2015-09-10 ENCOUNTER — Telehealth: Payer: Self-pay | Admitting: Hematology and Oncology

## 2015-09-10 ENCOUNTER — Encounter: Payer: Self-pay | Admitting: Hematology and Oncology

## 2015-09-10 ENCOUNTER — Other Ambulatory Visit (HOSPITAL_BASED_OUTPATIENT_CLINIC_OR_DEPARTMENT_OTHER): Payer: 59

## 2015-09-10 VITALS — BP 106/64 | HR 79 | Temp 97.8°F | Resp 18 | Wt 146.2 lb

## 2015-09-10 DIAGNOSIS — D72819 Decreased white blood cell count, unspecified: Secondary | ICD-10-CM | POA: Diagnosis not present

## 2015-09-10 DIAGNOSIS — C50511 Malignant neoplasm of lower-outer quadrant of right female breast: Secondary | ICD-10-CM | POA: Diagnosis not present

## 2015-09-10 DIAGNOSIS — Z95828 Presence of other vascular implants and grafts: Secondary | ICD-10-CM

## 2015-09-10 DIAGNOSIS — R682 Dry mouth, unspecified: Secondary | ICD-10-CM

## 2015-09-10 DIAGNOSIS — Z79811 Long term (current) use of aromatase inhibitors: Secondary | ICD-10-CM

## 2015-09-10 DIAGNOSIS — Z170 Estrogen receptor positive status [ER+]: Secondary | ICD-10-CM | POA: Diagnosis not present

## 2015-09-10 LAB — COMPREHENSIVE METABOLIC PANEL
ALBUMIN: 3.6 g/dL (ref 3.5–5.0)
ALK PHOS: 92 U/L (ref 40–150)
ALT: 20 U/L (ref 0–55)
ANION GAP: 9 meq/L (ref 3–11)
AST: 18 U/L (ref 5–34)
BILIRUBIN TOTAL: 0.35 mg/dL (ref 0.20–1.20)
BUN: 14.3 mg/dL (ref 7.0–26.0)
CALCIUM: 9.2 mg/dL (ref 8.4–10.4)
CO2: 25 mEq/L (ref 22–29)
CREATININE: 0.8 mg/dL (ref 0.6–1.1)
Chloride: 108 mEq/L (ref 98–109)
EGFR: 81 mL/min/{1.73_m2} — AB (ref >90)
Glucose: 136 mg/dl (ref 70–140)
Potassium: 3.7 mEq/L (ref 3.5–5.1)
Sodium: 141 mEq/L (ref 136–145)
TOTAL PROTEIN: 6.5 g/dL (ref 6.4–8.3)

## 2015-09-10 LAB — CBC WITH DIFFERENTIAL/PLATELET
BASO%: 0.4 % (ref 0.0–2.0)
Basophils Absolute: 0 10*3/uL (ref 0.0–0.1)
EOS ABS: 0.1 10*3/uL (ref 0.0–0.5)
EOS%: 2 % (ref 0.0–7.0)
HEMATOCRIT: 36.8 % (ref 34.8–46.6)
HGB: 12.3 g/dL (ref 11.6–15.9)
LYMPH#: 0.6 10*3/uL — AB (ref 0.9–3.3)
LYMPH%: 18.1 % (ref 14.0–49.7)
MCH: 29.3 pg (ref 25.1–34.0)
MCHC: 33.5 g/dL (ref 31.5–36.0)
MCV: 87.5 fL (ref 79.5–101.0)
MONO#: 0.4 10*3/uL (ref 0.1–0.9)
MONO%: 10.2 % (ref 0.0–14.0)
NEUT%: 69.3 % (ref 38.4–76.8)
NEUTROS ABS: 2.5 10*3/uL (ref 1.5–6.5)
PLATELETS: 200 10*3/uL (ref 145–400)
RBC: 4.2 10*6/uL (ref 3.70–5.45)
RDW: 14.3 % (ref 11.2–14.5)
WBC: 3.6 10*3/uL — AB (ref 3.9–10.3)

## 2015-09-10 MED ORDER — HEPARIN SOD (PORK) LOCK FLUSH 100 UNIT/ML IV SOLN
500.0000 [IU] | INTRAVENOUS | Status: DC | PRN
Start: 2015-09-10 — End: 2015-09-10
  Filled 2015-09-10: qty 5

## 2015-09-10 MED ORDER — SODIUM CHLORIDE 0.9% FLUSH
10.0000 mL | INTRAVENOUS | Status: DC | PRN
Start: 2015-09-10 — End: 2015-09-10
  Filled 2015-09-10: qty 10

## 2015-09-10 NOTE — Assessment & Plan Note (Signed)
Right lumpectomy 11/27/2014: Invasive ductal carcinoma grade 3, 3.2 cm, with high-grade DCIS, LVI present,, 1/1 intramammary lymph node positive, 1 SLN micro-met, stage IIB, T2 N1a ER 100%, PR 12%, HER-2 negative, Ki-67 51% Patient is enrolled on PREVENT clinical trial Lipitor versus placebo S/P Adj chemo AC x 4 foll by Abraxane weekly x 12 (started 12/28/14 completed 05/14/15)  Current treatment: Anastrozole 1 mg daily 5-10 years Anastrozole toxicities:   PREVENT Trial: No toxicities from the study drug.  RTC in 6 months for follow-up

## 2015-09-10 NOTE — Telephone Encounter (Signed)
Chart reviewed.

## 2015-09-10 NOTE — Progress Notes (Signed)
Patient Care Team: Jonathon Jordan, MD as PCP - General (Family Medicine) Carola Frost, RN as Registered Nurse Nicholas Lose, MD as Consulting Physician (Hematology and Oncology)  SUMMARY OF ONCOLOGIC HISTORY:   Breast cancer of lower-outer quadrant of right female breast ([place])   11/06/2008 Surgery Left breast lumpectomy: High-grade DCIS 2.8 cm, 18 lymph nodes negative, ER 98%, PR 81%, stage 0, patient refused antiestrogen therapy   10/18/2014 Initial Biopsy Right breast invasive ductal carcinoma with DCIS grade 3, lymphovascular invasion, ER 100%, PR 12%, HER-2 negative ratio 1.4, Ki-67 51%, T2 N0 M0 stage II a clinical stage   10/24/2014 Breast MRI Right breast irregular enhancing mass 2.9 x 2.6 x 2.4 cm, left breast evidence of prior lumpectomy and left axillary dissection   11/27/2014 Surgery Right lumpectomy: Invasive ductal carcinoma grade 3, 3.2 cm, with high-grade DCIS, LVI present,, 1/1 intramammary lymph node positive, 1 SLN micro-met, stage IIB, T2 N1a   12/28/2014 - 05/10/2015 Chemotherapy Adjuvant dose dense Adriamycin and Cytoxan X 4 followed by Abraxane weekly 12   06/04/2015 - 07/12/2015 Radiation Therapy Adj XRT   08/01/2015 -  Anti-estrogen oral therapy Anastrozole 1 mg daily 5-10 years    CHIEF COMPLIANT: Follow-up on anastrozole therapy  INTERVAL HISTORY: Natalie Valdez is a 66 year old with above-mentioned history of left breast cancer currently on adjuvant antiestrogen therapy with anastrozole. Is here for toxicity check and follow-up. She denies any hot flashes or myalgias. She feels that she has a dry mouth. Her taste is almost back with her certain things that she still cannot eat.  REVIEW OF SYSTEMS:   Constitutional: Denies fevers, chills or abnormal weight loss Eyes: Denies blurriness of vision Ears, nose, mouth, throat, and face: Denies mucositis or sore throat Respiratory: Denies cough, dyspnea or wheezes Cardiovascular: Denies palpitation, chest  discomfort Gastrointestinal:  Denies nausea, heartburn or change in bowel habits Skin: Denies abnormal skin rashes Lymphatics: Denies new lymphadenopathy or easy bruising Neurological:Denies numbness, tingling or new weaknesses Behavioral/Psych: Mood is stable, no new changes  Extremities: No lower extremity edema Breast:  Serous drainage from the nipple All other systems were reviewed with the patient and are negative.  I have reviewed the past medical history, past surgical history, social history and family history with the patient and they are unchanged from previous note.  ALLERGIES:  has No Known Allergies.  MEDICATIONS:  Current Outpatient Prescriptions  Medication Sig Dispense Refill  . anastrozole (ARIMIDEX) 1 MG tablet Take 1 tablet (1 mg total) by mouth daily. 90 tablet 3  . Atorvastatin Calcium (INVESTIGATIONAL ATORVASTATIN/PLACEBO) 40 MG tablet [facility] [id number] Take 1 tablet by mouth daily. Take 1 tablet daily with or without food. 180 tablet 0  . celecoxib (CELEBREX) 200 MG capsule Take 200 mg by mouth 1 day or 1 dose.    . citalopram (CELEXA) 20 MG tablet Take 20 mg by mouth every evening.   11  . Cyanocobalamin (B-12 PO) Take 1 tablet by mouth daily at 8 pm. Reported on 08/15/2015    . lidocaine-prilocaine (EMLA) cream APP EXT AA 1 TIME  3  . omeprazole (PRILOSEC) 40 MG capsule Take 1 capsule (40 mg total) by mouth daily. 30 capsule 3  . RESTASIS 0.05 % ophthalmic emulsion Place into both eyes 2 (two) times daily.   9  . Wound Dressings (SONAFINE) Apply 1 application topically 2 (two) times daily. Reported on 08/15/2015    . zolpidem (AMBIEN) 10 MG tablet   5   No  current facility-administered medications for this visit.   Facility-Administered Medications Ordered in Other Visits  Medication Dose Route Frequency Provider Last Rate Last Dose  . heparin lock flush 100 unit/mL  500 Units Intracatheter PRN Nicholas Lose, MD      . sodium chloride flush (NS) 0.9 %  injection 10 mL  10 mL Intracatheter PRN Nicholas Lose, MD        PHYSICAL EXAMINATION: ECOG PERFORMANCE STATUS: 1 - Symptomatic but completely ambulatory  Filed Vitals:   09/10/15 1120  BP: 106/64  Pulse: 79  Temp: 97.8 F (36.6 C)  Resp: 18   Filed Weights   09/10/15 1120  Weight: 146 lb 3.2 oz (66.316 kg)    GENERAL:alert, no distress and comfortable SKIN: skin color, texture, turgor are normal, no rashes or significant lesions EYES: normal, Conjunctiva are pink and non-injected, sclera clear OROPHARYNX:no exudate, no erythema and lips, buccal mucosa, and tongue normal  NECK: supple, thyroid normal size, non-tender, without nodularity LYMPH:  no palpable lymphadenopathy in the cervical, axillary or inguinal LUNGS: clear to auscultation and percussion with normal breathing effort HEART: regular rate & rhythm and no murmurs and no lower extremity edema ABDOMEN:abdomen soft, non-tender and normal bowel sounds MUSCULOSKELETAL:no cyanosis of digits and no clubbing  NEURO: alert & oriented x 3 with fluent speech, no focal motor/sensory deficits EXTREMITIES: No lower extremity edema  LABORATORY DATA:  I have reviewed the data as listed   Chemistry      Component Value Date/Time   NA 138 07/09/2015 0916   NA 138 11/26/2014 1508   K 4.2 07/09/2015 0916   K 4.6 11/26/2014 1508   CL 103 11/26/2014 1508   CO2 22 07/09/2015 0916   CO2 25 11/26/2014 1508   BUN 14.8 07/09/2015 0916   BUN 17 11/26/2014 1508   CREATININE 0.7 07/09/2015 0916   CREATININE 0.83 11/26/2014 1508      Component Value Date/Time   CALCIUM 8.9 07/09/2015 0916   CALCIUM 9.3 11/26/2014 1508   ALKPHOS 93 07/09/2015 0916   ALKPHOS 90 11/26/2014 1508   AST 18 07/09/2015 0916   AST 17 11/26/2014 1508   ALT 18 07/09/2015 0916   ALT 15 11/26/2014 1508   BILITOT 0.43 07/09/2015 0916   BILITOT 0.3 11/26/2014 1508      Lab Results  Component Value Date   WBC 3.6* 09/10/2015   HGB 12.3 09/10/2015   HCT  36.8 09/10/2015   MCV 87.5 09/10/2015   PLT 200 09/10/2015   NEUTROABS 2.5 09/10/2015   ASSESSMENT & PLAN:  Breast cancer of lower-outer quadrant of right female breast Right lumpectomy 11/27/2014: Invasive ductal carcinoma grade 3, 3.2 cm, with high-grade DCIS, LVI present,, 1/1 intramammary lymph node positive, 1 SLN micro-met, stage IIB, T2 N1a ER 100%, PR 12%, HER-2 negative, Ki-67 51% Patient is enrolled on PREVENT clinical trial Lipitor versus placebo S/P Adj chemo AC x 4 foll by Abraxane weekly x 12 (started 12/28/14 completed 05/14/15)  Current treatment: Anastrozole 1 mg daily 5-10 years Anastrozole toxicities:  1. Dry mouth Denies any hot flashes or myalgias Blood work was reviewed. I do not think there is any need to repeat blood work in the future. Slightly low white blood cell count 3.6 is of no clinical significance.  PREVENT Trial: No toxicities from the study drug.  RTC in 4 months for follow-up   No orders of the defined types were placed in this encounter.   The patient has a good understanding of the  overall plan. she agrees with it. she will call with any problems that may develop before the next visit here.   Rulon Eisenmenger, MD 09/10/2015

## 2015-09-10 NOTE — Telephone Encounter (Signed)
Gave patient avs report and appointment schedule for March (SCP visit) and June.

## 2015-09-10 NOTE — Progress Notes (Signed)
Unable to get in to exam room prior to MD.  No assessment performed.  

## 2015-09-11 ENCOUNTER — Other Ambulatory Visit: Payer: Self-pay

## 2015-09-11 NOTE — Progress Notes (Signed)
Order faxed to [facility] for annual mamogram.  Sent to scan.

## 2015-10-14 ENCOUNTER — Telehealth: Payer: Self-pay | Admitting: Nurse Practitioner

## 2015-10-14 NOTE — Telephone Encounter (Signed)
Due to PAL moved 3/30 SCP visit to 4/4 @ 10 am. spoke with patient re new date/time. Per patient request added flush after visit with HM - per patient last flush 2/14.

## 2015-10-24 ENCOUNTER — Encounter: Payer: 59 | Admitting: Nurse Practitioner

## 2015-10-29 ENCOUNTER — Ambulatory Visit (HOSPITAL_BASED_OUTPATIENT_CLINIC_OR_DEPARTMENT_OTHER): Payer: BLUE CROSS/BLUE SHIELD

## 2015-10-29 ENCOUNTER — Encounter: Payer: Self-pay | Admitting: Nurse Practitioner

## 2015-10-29 ENCOUNTER — Ambulatory Visit (HOSPITAL_BASED_OUTPATIENT_CLINIC_OR_DEPARTMENT_OTHER): Payer: BLUE CROSS/BLUE SHIELD | Admitting: Nurse Practitioner

## 2015-10-29 ENCOUNTER — Encounter: Payer: Self-pay | Admitting: *Deleted

## 2015-10-29 VITALS — BP 129/66 | HR 79 | Temp 97.5°F | Resp 17 | Ht 64.0 in | Wt 143.1 lb

## 2015-10-29 DIAGNOSIS — Z853 Personal history of malignant neoplasm of breast: Secondary | ICD-10-CM

## 2015-10-29 DIAGNOSIS — C50511 Malignant neoplasm of lower-outer quadrant of right female breast: Secondary | ICD-10-CM

## 2015-10-29 DIAGNOSIS — Z95828 Presence of other vascular implants and grafts: Secondary | ICD-10-CM

## 2015-10-29 DIAGNOSIS — Z79811 Long term (current) use of aromatase inhibitors: Secondary | ICD-10-CM

## 2015-10-29 MED ORDER — SODIUM CHLORIDE 0.9% FLUSH
10.0000 mL | INTRAVENOUS | Status: DC | PRN
  Administered 2015-10-29: 10 mL via INTRAVENOUS
  Filled 2015-10-29: qty 10

## 2015-10-29 MED ORDER — HEPARIN SOD (PORK) LOCK FLUSH 100 UNIT/ML IV SOLN
500.0000 [IU] | Freq: Once | INTRAVENOUS | Status: AC
  Administered 2015-10-29: 500 [IU] via INTRAVENOUS
  Filled 2015-10-29: qty 5

## 2015-10-29 NOTE — Progress Notes (Signed)
10/29/2015  This RN met briefly with the patient to discuss upcoming 12 month visit for the PREVENT study.  Based on number of pills left in current bottle, we will arrange for the 12 month visit on 12/10/15.  The patient also returned medication diaries for Nov. 2016 through March 2017.  The patient had no questions and denied complaints. The patient was thanked for her continued compliance and participation on the [place] study. Marcellus Scott, RN, BSN, MHA, OCN 10/29/2015 1130

## 2015-10-29 NOTE — Addendum Note (Signed)
Addended by: Chestine Spore T on: 10/29/2015 12:42 PM   Modules accepted: Orders, Medications

## 2015-10-29 NOTE — Progress Notes (Signed)
CLINIC:  Cancer Survivorship   REASON FOR VISIT:  Routine follow-up post-treatment for a recent history of breast cancer.  BRIEF ONCOLOGIC HISTORY:    Breast cancer of lower-outer quadrant of right female breast ([place])   11/06/2008 Surgery Left breast lumpectomy: High-grade DCIS 2.8 cm, 18 lymph nodes negative, ER 98%, PR 81%, stage 0, patient refused antiestrogen therapy   10/17/2014 Breast US Right breast: 2.5 cm irregular solid mass at 11:00, posterior depth   10/18/2014 Initial Biopsy Right breast invasive ductal carcinoma with DCIS grade 3, lymphovascular invasion, ER 100%, PR 12%, HER-2 negative ratio 1.4, Ki-67 51%   10/18/2014 Clinical Stage Stage IIA: T2 N0 M0   10/24/2014 Breast MRI Right breast irregular enhancing mass 2.9 x 2.6 x 2.4 cm, left breast evidence of prior lumpectomy and left axillary dissection   11/27/2014 Surgery Right lumpectomy: Invasive ductal carcinoma grade 3, 3.2 cm, with high-grade DCIS, LVI present,, 1/1 intramammary lymph node positive, 1 SLN micro-met, (2/3 total)   11/27/2014 Pathologic Stage Stage IIB: T2 N1a   12/28/2014 - 05/10/2015 Chemotherapy Adjuvant dose dense Adriamycin and Cytoxan X 4 followed by Abraxane weekly 12   06/04/2015 - 07/12/2015 Radiation Therapy Adj XRT: Right breast and regional lymph nodes 50.4 Gy over 28 sessions (no boost)   08/01/2015 -  Anti-estrogen oral therapy Anastrozole 1 mg daily 5-10 years    INTERVAL HISTORY:  Natalie Valdez presents to the [facility] today for our initial meeting to review her survivorship care plan detailing her treatment course for breast cancer, as well as monitoring long-term side effects of that treatment, education regarding health maintenance, screening, and overall wellness and health promotion.     Overall, Natalie Valdez reports feeling quite well since completing her radiation therapy approximately three and a half months ago.  She reports continued mild fatigue, but states that it does not  interfere with her daily activities. She continues to care for her family and work full time. She reports that the skin changes overlying her right breast have improved. She still has her port a cath and is scheduled to have it flushed today. She denies headache, cough,or shortness of breath.  She does have continued knee pain and recently underwent cortisone injection with good relief.  It has been somewhat aggravated by the anastrozole, which she began in January.  She has mild hot flashes and vaginal dryness.  She has a good appetite and denies and weight loss.    REVIEW OF SYSTEMS:  General: Rare hot flashes. Mild fatigue as above.  Denies fever, chills, unintentional weight loss, or night sweats. HEENT: Wears glasses. Denies visual changes, hearing loss, mouth sores or difficulty swallowing. Cardiac: Denies palpitations, chest pain, and lower extremity edema.  Respiratory: Denies wheeze or dyspnea on exertion.  Breast: Denies any new nodularity, masses, tenderness, nipple changes, or nipple discharge.  GI: Denies abdominal pain, constipation, diarrhea, nausea, or vomiting.  GU: Vaginal dryness, as above.  Denies dysuria, hematuria, vaginal bleeding, vaginal discharge, or vaginal dryness.  Musculoskeletal: Knee pain s/p recent cortisone injection. Neuro: Denies recent fall or numbness / tingling in her extremities. Skin: Denies rash, pruritis, or open wounds.  Psych: Mild anxiety and concern over potential for cancer recurrence.  Otherwise, denies depression, insomnia, or memory loss.   A 14-point review of systems was completed and was negative, except as noted above.   ONCOLOGY TREATMENT TEAM:  1. Surgeon:  Dr. Brantley Stage at [facility]  2. Medical Oncologist: Dr. Lindi Adie 3. Radiation Oncologist:  Dr. Kinard   PAST MEDICAL/SURGICAL HISTORY:  Past Medical History  Diagnosis Date  . Breast cancer, left breast ([facility]) 11/06/2008    breast  . Generalized headaches   . Arthritis     . Breast cancer, right breast ([facility]) 10/18/2014  . PONV (postoperative nausea and vomiting)     needs scop patch  . Heart murmur     dx 2005-never had echo  . Breast cancer ([facility])   . Radiation 06/04/15-07/12/15    right breast and regional lymph nodes 5040 cGy   Past Surgical History  Procedure Laterality Date  . Cesarean section    . Dilation and curettage of uterus  1999  . Breast lumpectomy  2010    lumpectomy-lt-snbx  . Breast lumpectomy  2010    lt re-excision br  . Abdominal hysterectomy  2004    BSO  . Breast enhancement surgery  2010  . Breast lumpectomy with radioactive seed and sentinel lymph node biopsy Right 11/27/2014    Procedure: BREAST LUMPECTOMY WITH RADIOACTIVE SEED AND SENTINEL LYMPH NODE MAPPING;  Surgeon: Thomas Cornett, MD;  Location: [facility];  Service: General;  Laterality: Right;  . Portacath placement Right 12/18/2014    Procedure: INSERTION PORT-A-CATH WITH US AND FLOURO;  Surgeon: Thomas Cornett, MD;  Location: [facility];  Service: General;  Laterality: Right;    ALLERGIES:  No Known Allergies   CURRENT MEDICATIONS:  Current Outpatient Prescriptions on File Prior to Visit  Medication Sig Dispense Refill  . anastrozole (ARIMIDEX) 1 MG tablet Take 1 tablet (1 mg total) by mouth daily. 90 tablet 3  . Atorvastatin Calcium (INVESTIGATIONAL ATORVASTATIN/PLACEBO) 40 MG tablet [place] [id number] Take 1 tablet by mouth daily. Take 1 tablet daily with or without food. 180 tablet 0  . celecoxib (CELEBREX) 200 MG capsule Take 200 mg by mouth 1 day or 1 dose.    . citalopram (CELEXA) 20 MG tablet Take 20 mg by mouth every evening.   11  . Cyanocobalamin (B-12 PO) Take 1 tablet by mouth daily at 8 pm. Reported on 08/15/2015    . lidocaine-prilocaine (EMLA) cream APP EXT AA 1 TIME  3  . omeprazole (PRILOSEC) 40 MG capsule TAKE 1 CAPSULE(40 MG) BY MOUTH DAILY 30 capsule 5  . RESTASIS 0.05 % ophthalmic emulsion Place into both eyes 2  (two) times daily.   9  . Wound Dressings (SONAFINE) Apply 1 application topically 2 (two) times daily. Reported on 08/15/2015    . zolpidem (AMBIEN) 10 MG tablet   5   No current facility-administered medications on file prior to visit.    ONCOLOGIC FAMILY HISTORY:  Family History  Problem Relation Age of Onset  . Breast cancer Mother   . Ovarian cancer Paternal Grandmother     GENETIC COUNSELING/TESTING: No   SOCIAL HISTORY:  Tinzlee W Such is married and lives with her spouse in [place], [place].  She has 3 biological children and serves as a foster mother as well as having adopted another 2 children. 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  She denies any current or history of tobacco or illicit drug use.  She uses alcohol on occasion.   PHYSICAL EXAMINATION:  Vital Signs: Filed Vitals:   10/29/15 1005  BP: 129/66  Pulse: 79  Temp: 97.5 F (36.4 C)  Resp: 17   ECOG Performance Status: 0  General: Well-nourished, well-appearing female in no acute distress.  She is unaccompanied in clinic   today.   HEENT: Head is atraumatic and normocephalic.  Pupils equal and reactive to light and accomodation. Conjunctivae clear without exudate.  Sclerae anicteric. Oral mucosa is pink, moist, and intact without lesions.  Oropharynx is pink without lesions or erythema.  Lymph: No cervical, supraclavicular, infraclavicular, or axillary lymphadenopathy noted on palpation.  Cardiovascular: Regular rate and rhythm without murmurs, rubs, or gallops. Respiratory: Clear to auscultation bilaterally. Chest expansion symmetric without accessory muscle use on inspiration or expiration.  GI: Abdomen soft and round. No tenderness to palpation. Bowel sounds normoactive in 4 quadrants. GU: Deferred.  Neuro: No focal deficits. Steady gait.  Psych: Mood and affect normal and appropriate for situation.  Extremities: No edema, cyanosis, or clubbing.  Skin: Warm and dry. No open  lesions noted.   LABORATORY DATA:  None for this visit.  DIAGNOSTIC IMAGING:  None for this visit.     ASSESSMENT AND PLAN:   1. History of breast cancer: Stage IIB invasive ductal carcinoma of the right breast (09/2014), grade 3, ER positive, PR positive, HER2/neu negative, S/P right lumpectomy/SLNB (11/2014) followed by adjuvant chemotherapy with doxorubicin and cyclophosphamide x 4 followed by weekly nab-paclitaxel x 12 (completed 04/2015) followed by adjuvant radiation therapy to the right breast and regional lymph nodes (completed 06/2015) followed by adjuvant endocrine therapy with anastrozole begun 07/2015.  Natalie Valdez is doing well without clinical symptoms worrisome for disease recurrence. She will follow-up with her medical oncologist,  Dr. Gudena, in June 2017 and her radiation oncologist, Dr. Kinard, in July 2017 with history and physical examination per surveillance protocol.  She will continue her anti-estrogen therapy with anastrozole at this time. She was instructed to make us aware if she begins to experience any new or increased side effects of the medication and I could see her back in clinic to help manage those side effects, as needed.  We discussed management of hot flashes as well as vaginal dryness.  I encouraged her to begin regular use of vaginal moisturizers and lubricants with sexual intercourse.. She was seen by Gina Dixon RN today regarding the PREVENT clinical trial.  A comprehensive survivorship care plan and treatment summary was reviewed with the patient today detailing her breast cancer diagnosis, treatment course, potential late/long-term effects of treatment, appropriate follow-up care with recommendations for the future, and patient education resources.  A copy of this summary, along with a letter will be sent to the patient's primary care provider via in basket message after today's visit.  Natalie Valdez is welcome to return to the Survivorship Clinic in the future, as  needed; no follow-up will be scheduled at this time.    2. Bone health:  Given Natalie Valdez's age/history of breast cancer and her current treatment regimen including endocrine therapy with anastrozole she is at risk for bone demineralization.  She has not yet had her baseline DEXA scan and will discuss this with Dr. Gudena at her upcoming appointment. We will continue to monitor this closely while she is on endocrine therapy.  In the meantime, she was encouraged to increase her consumption of foods rich in calcium and vitamin D as well as to increase her weight-bearing activities.  She was given education on specific activities to promote bone health.  3. Cancer screening:  Due to Natalie Valdez's history and her age, she should receive screening for skin cancers and colon cancer. She is S/P hysterectomy.  The information and recommendations are listed on the patient's comprehensive care plan/treatment summary and were reviewed in   detail with the patient.    4. Health maintenance and wellness promotion: Natalie Valdez was encouraged to consume 5-7 servings of fruits and vegetables per day. We reviewed the "Nutrition Rainbow" handout, as well as discussed recommendations to maximize nutrition and minimize recurrence, such as increased intake of fruits, vegetables, lean proteins, and minimizing the intake of red meats and processed foods.  She was also encouraged to engage in moderate to vigorous exercise for 30 minutes per day most days of the week. We discussed the [employer] fitness program, which is designed for cancer survivors to help them become more physically fit after cancer treatments.  She was instructed to limit her alcohol consumption and continue to abstain from tobacco use.  A copy of the "Take Control of Your Health" brochure was given to her reinforcing these recommendations.   5. Support services/counseling: It is not uncommon for this period of the patient's cancer care trajectory to be one of  many emotions and stressors. We discussed an opportunity for her to participate in the next session of [facility] ("Finding Your New Normal") support group series designed for patients after they have completed treatment. Natalie Valdez was encouraged to take advantage of our many other support services programs, support groups, and/or counseling in coping with her new life as a cancer survivor after completing anti-cancer treatment. She was offered support today through active listening and expressive supportive counseling. She was given information regarding our available services and encouraged to contact me with any questions or for help enrolling in any of our support group/programs.    A total of 60 minutes of face-to-face time was spent with this patient with greater than 50% of that time in counseling and care-coordination.   Sylvan Cheese, NP  Survivorship Program [facility] [phone]   Note: PRIMARY CARE PROVIDER Lilian Coma, [place] [phone]

## 2015-10-29 NOTE — Patient Instructions (Addendum)

## 2015-12-03 ENCOUNTER — Encounter: Payer: Self-pay | Admitting: *Deleted

## 2015-12-03 NOTE — Progress Notes (Signed)
12/03/2015 1520 PREVENT Received study drug from [employer], kit # [id number] .  Drug taken to pharmacy and given to B. Perrsons, Pharm. Josefine Class, RN, BSN, MHA, OCN

## 2015-12-09 ENCOUNTER — Encounter: Payer: Self-pay | Admitting: *Deleted

## 2015-12-09 DIAGNOSIS — C50511 Malignant neoplasm of lower-outer quadrant of right female breast: Secondary | ICD-10-CM

## 2015-12-09 MED ORDER — INV-ATORVASTATIN/PLACEBO 40 MG TABS WAKE FOREST WF 98213: 1.0000 | ORAL_TABLET | Freq: Every day | ORAL | Status: DC

## 2015-12-10 ENCOUNTER — Other Ambulatory Visit: Payer: Self-pay

## 2015-12-10 ENCOUNTER — Encounter: Payer: BLUE CROSS/BLUE SHIELD | Admitting: *Deleted

## 2015-12-10 ENCOUNTER — Telehealth: Payer: Self-pay | Admitting: Hematology and Oncology

## 2015-12-10 NOTE — Progress Notes (Signed)
12/10/2015  0915  12 month PREVENT visit The patient returns to the clinic for follow-up.  She reports that she is doing well.  She denies having side effects of study drug, including muscle aches/pains, headache, abdominal pain, nausea, constipation, or URI.   She completed the 12 month neurocog. booklet today without any difficulty. The patient denies having any difficulties taking study drug or completing the medication diaries.  She returned the study med diary for April 2017.  She confirmed she has not missed taking any of her pills. She returned old bottle of study pills with 3 pills remaining.  Pill count confirmed with Henreitta Leber, Pharm. D.  A new bottle of study drug was checked, verified, and dispensed per Henreitta Leber, Pharm. D and this was given to the patient.  Kit # [id number].  The patient understands she will return for a new bottle in 6 months, for the 18 month visit.  She was given medication diaries for June-Nov. 2017.  She verbalized comprehension to notify MD for any side effects or concerns.   The patient asked this RN to let Dr. Lindi Adie know she has had right breast pain at lumpectomy site.  She mashed on it a few days ago and had clear discharge from nipple.  She has had discharge in the past, but it was not painful.  This RN sent Dr. Lindi Adie a message and spoke with his nurse, Eulas Post.  The patient was informed she should be contacted about her breast pain and nipple discharge later today.  She understands to call if she has not heard back from MD's office. Will continue to follow per protocol. Marcellus Scott, RN, BSN, MHA, OCN

## 2015-12-10 NOTE — Telephone Encounter (Signed)
cld & spoke top pt and gave pt time of appt for 6/13 flush/MD

## 2015-12-10 NOTE — Progress Notes (Signed)
Received note from Natalie Scott, RN who saw pt today.  Pt reported right breast pain at lumpectomy site as well as some intermittent clear discharge.  Called pt and spoke to her regarding this and pt reports the pain happened once and has not happened since.  She reports the discharge is intermittent but remains clear with no blood.  Pt reports no signs or symptoms of infection.  I spoke with Natalie Adie, MD who states pt can be seen sooner than her next follow up if she so wishes.  Pt states she is fine waiting until already scheduled appt for June but will call if there are any new symptoms or changes.  Pt instructed to try cool compresses should pain return.  Pt also inquiring about flush appt.  POF entered to have PAC flushed prior to seeing Gudena on 01/07/16.  Pt verbalized understanding and no further questions or concerns at time of call.

## 2016-01-07 ENCOUNTER — Encounter: Payer: Self-pay | Admitting: Hematology and Oncology

## 2016-01-07 ENCOUNTER — Ambulatory Visit (HOSPITAL_BASED_OUTPATIENT_CLINIC_OR_DEPARTMENT_OTHER): Payer: BLUE CROSS/BLUE SHIELD

## 2016-01-07 ENCOUNTER — Ambulatory Visit (HOSPITAL_BASED_OUTPATIENT_CLINIC_OR_DEPARTMENT_OTHER): Payer: BLUE CROSS/BLUE SHIELD | Admitting: Hematology and Oncology

## 2016-01-07 ENCOUNTER — Telehealth: Payer: Self-pay | Admitting: Hematology and Oncology

## 2016-01-07 ENCOUNTER — Encounter: Payer: 59 | Admitting: *Deleted

## 2016-01-07 ENCOUNTER — Other Ambulatory Visit: Payer: Self-pay | Admitting: *Deleted

## 2016-01-07 VITALS — BP 142/83 | HR 89 | Temp 98.2°F | Resp 18 | Ht 64.0 in | Wt 145.1 lb

## 2016-01-07 DIAGNOSIS — Z79811 Long term (current) use of aromatase inhibitors: Secondary | ICD-10-CM

## 2016-01-07 DIAGNOSIS — C50511 Malignant neoplasm of lower-outer quadrant of right female breast: Secondary | ICD-10-CM | POA: Diagnosis not present

## 2016-01-07 DIAGNOSIS — Z95828 Presence of other vascular implants and grafts: Secondary | ICD-10-CM | POA: Insufficient documentation

## 2016-01-07 DIAGNOSIS — Z170 Estrogen receptor positive status [ER+]: Secondary | ICD-10-CM | POA: Diagnosis not present

## 2016-01-07 MED ORDER — SODIUM CHLORIDE 0.9 % IJ SOLN
10.0000 mL | INTRAMUSCULAR | Status: DC | PRN
Start: 2016-01-07 — End: 2016-01-07
  Administered 2016-01-07: 10 mL via INTRAVENOUS
  Filled 2016-01-07: qty 10

## 2016-01-07 MED ORDER — HEPARIN SOD (PORK) LOCK FLUSH 100 UNIT/ML IV SOLN
500.0000 [IU] | Freq: Once | INTRAVENOUS | Status: AC | PRN
  Administered 2016-01-07: 500 [IU] via INTRAVENOUS
  Filled 2016-01-07: qty 5

## 2016-01-07 NOTE — Assessment & Plan Note (Signed)
Right lumpectomy 11/27/2014: Invasive ductal carcinoma grade 3, 3.2 cm, with high-grade DCIS, LVI present,, 1/1 intramammary lymph node positive, 1 SLN micro-met, stage IIB, T2 N1a ER 100%, PR 12%, HER-2 negative, Ki-67 51% Patient is enrolled on PREVENT clinical trial Lipitor versus placebo S/P Adj chemo AC x 4 foll by Abraxane weekly x 12 (started 12/28/14 completed 05/14/15)  Current treatment: Anastrozole 1 mg daily 5-10 years Anastrozole toxicities:  1. Dry mouth Denies any hot flashes or myalgias  PREVENT Trial: No toxicities from the study drug.  RTC in 6 months for follow-up

## 2016-01-07 NOTE — Progress Notes (Signed)
Patient Care Team: Jonathon Jordan, MD as PCP - General (Family Medicine) Carola Frost, RN as Registered Nurse Nicholas Lose, MD as Consulting Physician (Hematology and Oncology) Arloa Koh, MD as Consulting Physician (Radiation Oncology) Erroll Luna, MD as Consulting Physician (General Surgery) Sylvan Cheese, NP as Nurse Practitioner (Hematology and Oncology)  DIAGNOSIS: Breast cancer of lower-outer quadrant of right female breast [facility])   Staging form: Breast, AJCC 7th Edition     Clinical stage from 10/18/2014: Stage IIA (T2, N0, M0) - Unsigned     Pathologic stage from 11/27/2014: Stage IIB (T2, N1a, cM0) - Unsigned   SUMMARY OF ONCOLOGIC HISTORY:   Breast cancer of lower-outer quadrant of right female breast ([place])   11/06/2008 Surgery Left breast lumpectomy: High-grade DCIS 2.8 cm, 18 lymph nodes negative, ER 98%, PR 81%, stage 0, patient refused antiestrogen therapy   10/17/2014 Breast US Right breast: 2.5 cm irregular solid mass at 11:00, posterior depth   10/18/2014 Initial Biopsy Right breast invasive ductal carcinoma with DCIS grade 3, lymphovascular invasion, ER 100%, PR 12%, HER-2 negative ratio 1.4, Ki-67 51%   10/18/2014 Clinical Stage Stage IIA: T2 N0 M0   10/24/2014 Breast MRI Right breast irregular enhancing mass 2.9 x 2.6 x 2.4 cm, left breast evidence of prior lumpectomy and left axillary dissection   11/27/2014 Surgery Right lumpectomy: Invasive ductal carcinoma grade 3, 3.2 cm, with high-grade DCIS, LVI present,, 1/1 intramammary lymph node positive, 1 SLN micro-met, (2/3 total)   11/27/2014 Pathologic Stage Stage IIB: T2 N1a   12/28/2014 - 05/10/2015 Chemotherapy Adjuvant dose dense Adriamycin and Cytoxan X 4 followed by Abraxane weekly 12   06/04/2015 - 07/12/2015 Radiation Therapy Adj XRT: Right breast and regional lymph nodes 50.4 Gy over 28 sessions (no boost)   08/01/2015 -  Anti-estrogen oral therapy Anastrozole 1 mg daily 5-10 years   10/29/2015 Survivorship  Survivorship visit completed    CHIEF COMPLIANT: Follow-up on anastrozole  INTERVAL HISTORY: Natalie Valdez is a 65 year old with above-mentioned history of right breast cancer currently on antiestrogen therapy with anastrozole. She appears be tolerating extremely well. She had dry mouth initially but that has resolved. Denies any hot flashes or myalgias. She is staying very active and is full of energy.  REVIEW OF SYSTEMS:   Constitutional: Denies fevers, chills or abnormal weight loss Eyes: Denies blurriness of vision Ears, nose, mouth, throat, and face: Denies mucositis or sore throat Respiratory: Denies cough, dyspnea or wheezes Cardiovascular: Denies palpitation, chest discomfort Gastrointestinal:  Denies nausea, heartburn or change in bowel habits Skin: Denies abnormal skin rashes Lymphatics: Denies new lymphadenopathy or easy bruising Neurological:Denies numbness, tingling or new weaknesses Behavioral/Psych: Mood is stable, no new changes  Extremities: No lower extremity edema Breast:  denies any pain or lumps or nodules in either breasts All other systems were reviewed with the patient and are negative.  I have reviewed the past medical history, past surgical history, social history and family history with the patient and they are unchanged from previous note.  ALLERGIES:  has No Known Allergies.  MEDICATIONS:  Current Outpatient Prescriptions  Medication Sig Dispense Refill  . anastrozole (ARIMIDEX) 1 MG tablet Take 1 tablet (1 mg total) by mouth daily. 90 tablet 3  . Atorvastatin Calcium (INVESTIGATIONAL ATORVASTATIN/PLACEBO) 40 MG tablet [facility] [id number] Take 1 tablet by mouth daily. Take 1 tablet daily with or without food. 180 tablet 0  . Atorvastatin Calcium (INVESTIGATIONAL ATORVASTATIN/PLACEBO) 40 MG tablet [facility] [phone] Take 1  tablet by mouth daily. Take 1 tablet daily with or without food. 180 tablet 0  . celecoxib (CELEBREX) 200 MG capsule Take 200 mg  by mouth 1 day or 1 dose.    . citalopram (CELEXA) 20 MG tablet Take 20 mg by mouth every evening.   11  . Cyanocobalamin (B-12 PO) Take 1 tablet by mouth daily at 8 pm. Reported on 08/15/2015    . lidocaine-prilocaine (EMLA) cream APP EXT AA 1 TIME  3  . omeprazole (PRILOSEC) 40 MG capsule TAKE 1 CAPSULE(40 MG) BY MOUTH DAILY 30 capsule 5  . RESTASIS 0.05 % ophthalmic emulsion Place into both eyes 2 (two) times daily.   9  . traMADol (ULTRAM) 50 MG tablet Take by mouth 3 (three) times daily as needed.    . zolpidem (AMBIEN) 10 MG tablet   5   No current facility-administered medications for this visit.    PHYSICAL EXAMINATION: ECOG PERFORMANCE STATUS: 0 - Asymptomatic  Filed Vitals:   01/07/16 1004  BP: 142/83  Pulse: 89  Temp: 98.2 F (36.8 C)  Resp: 18   Filed Weights   01/07/16 1004  Weight: 145 lb 1.6 oz (65.817 kg)    GENERAL:alert, no distress and comfortable SKIN: skin color, texture, turgor are normal, no rashes or significant lesions EYES: normal, Conjunctiva are pink and non-injected, sclera clear OROPHARYNX:no exudate, no erythema and lips, buccal mucosa, and tongue normal  NECK: supple, thyroid normal size, non-tender, without nodularity LYMPH:  no palpable lymphadenopathy in the cervical, axillary or inguinal LUNGS: clear to auscultation and percussion with normal breathing effort HEART: regular rate & rhythm and no murmurs and no lower extremity edema ABDOMEN:abdomen soft, non-tender and normal bowel sounds MUSCULOSKELETAL:no cyanosis of digits and no clubbing  NEURO: alert & oriented x 3 with fluent speech, no focal motor/sensory deficits EXTREMITIES: No lower extremity edema BREAST: No palpable masses or nodules in either right or left breasts. No palpable axillary supraclavicular or infraclavicular adenopathy no breast tenderness or nipple discharge. (exam performed in the presence of a chaperone)  LABORATORY DATA:  I have reviewed the data as listed    Chemistry      Component Value Date/Time   NA 141 09/10/2015 1036   NA 138 11/26/2014 1508   K 3.7 09/10/2015 1036   K 4.6 11/26/2014 1508   CL 103 11/26/2014 1508   CO2 25 09/10/2015 1036   CO2 25 11/26/2014 1508   BUN 14.3 09/10/2015 1036   BUN 17 11/26/2014 1508   CREATININE 0.8 09/10/2015 1036   CREATININE 0.83 11/26/2014 1508      Component Value Date/Time   CALCIUM 9.2 09/10/2015 1036   CALCIUM 9.3 11/26/2014 1508   ALKPHOS 92 09/10/2015 1036   ALKPHOS 90 11/26/2014 1508   AST 18 09/10/2015 1036   AST 17 11/26/2014 1508   ALT 20 09/10/2015 1036   ALT 15 11/26/2014 1508   BILITOT 0.35 09/10/2015 1036   BILITOT 0.3 11/26/2014 1508       Lab Results  Component Value Date   WBC 3.6* 09/10/2015   HGB 12.3 09/10/2015   HCT 36.8 09/10/2015   MCV 87.5 09/10/2015   PLT 200 09/10/2015   NEUTROABS 2.5 09/10/2015     ASSESSMENT & PLAN:  Breast cancer of lower-outer quadrant of right female breast Right lumpectomy 11/27/2014: Invasive ductal carcinoma grade 3, 3.2 cm, with high-grade DCIS, LVI present,, 1/1 intramammary lymph node positive, 1 SLN micro-met, stage IIB, T2 N1a ER 100%, PR 12%,  HER-2 negative, Ki-67 51% Patient is enrolled on PREVENT clinical trial Lipitor versus placebo S/P Adj chemo AC x 4 foll by Abraxane weekly x 12 (started 12/28/14 completed 05/14/15)  Current treatment: Anastrozole 1 mg daily 5-10 years Anastrozole toxicities:  1. Dry mouth Denies any hot flashes or myalgias  PREVENT Trial: No toxicities from the study drug.  RTC in 6 months for follow-up And after that once a year. Patient is very keen on getting a mammogram or ultrasound every 6 months. I discussed with her that every 6 months is not standard of care. However if she has any symptoms we'll be evaluating her intermittently as needed. She may look to see if she wants to pay out of pocket for the mammograms that she wants to do every 6 months.   No orders of the defined types were  placed in this encounter.   The patient has a good understanding of the overall plan. she agrees with it. she will call with any problems that may develop before the next visit here.   Rulon Eisenmenger, MD 01/07/2016

## 2016-01-07 NOTE — Patient Instructions (Signed)

## 2016-01-07 NOTE — Telephone Encounter (Signed)
appt made and avs printed °

## 2016-01-09 IMAGING — CR DG CHEST 1V PORT
1 series · 1 of 1 positions shown · non-contrast
Comparison: 11/01/2008

CLINICAL DATA: Port-A-Cath insertion. History of right breast
cancer.

EXAM:
PORTABLE CHEST - 1 VIEW

[AP]
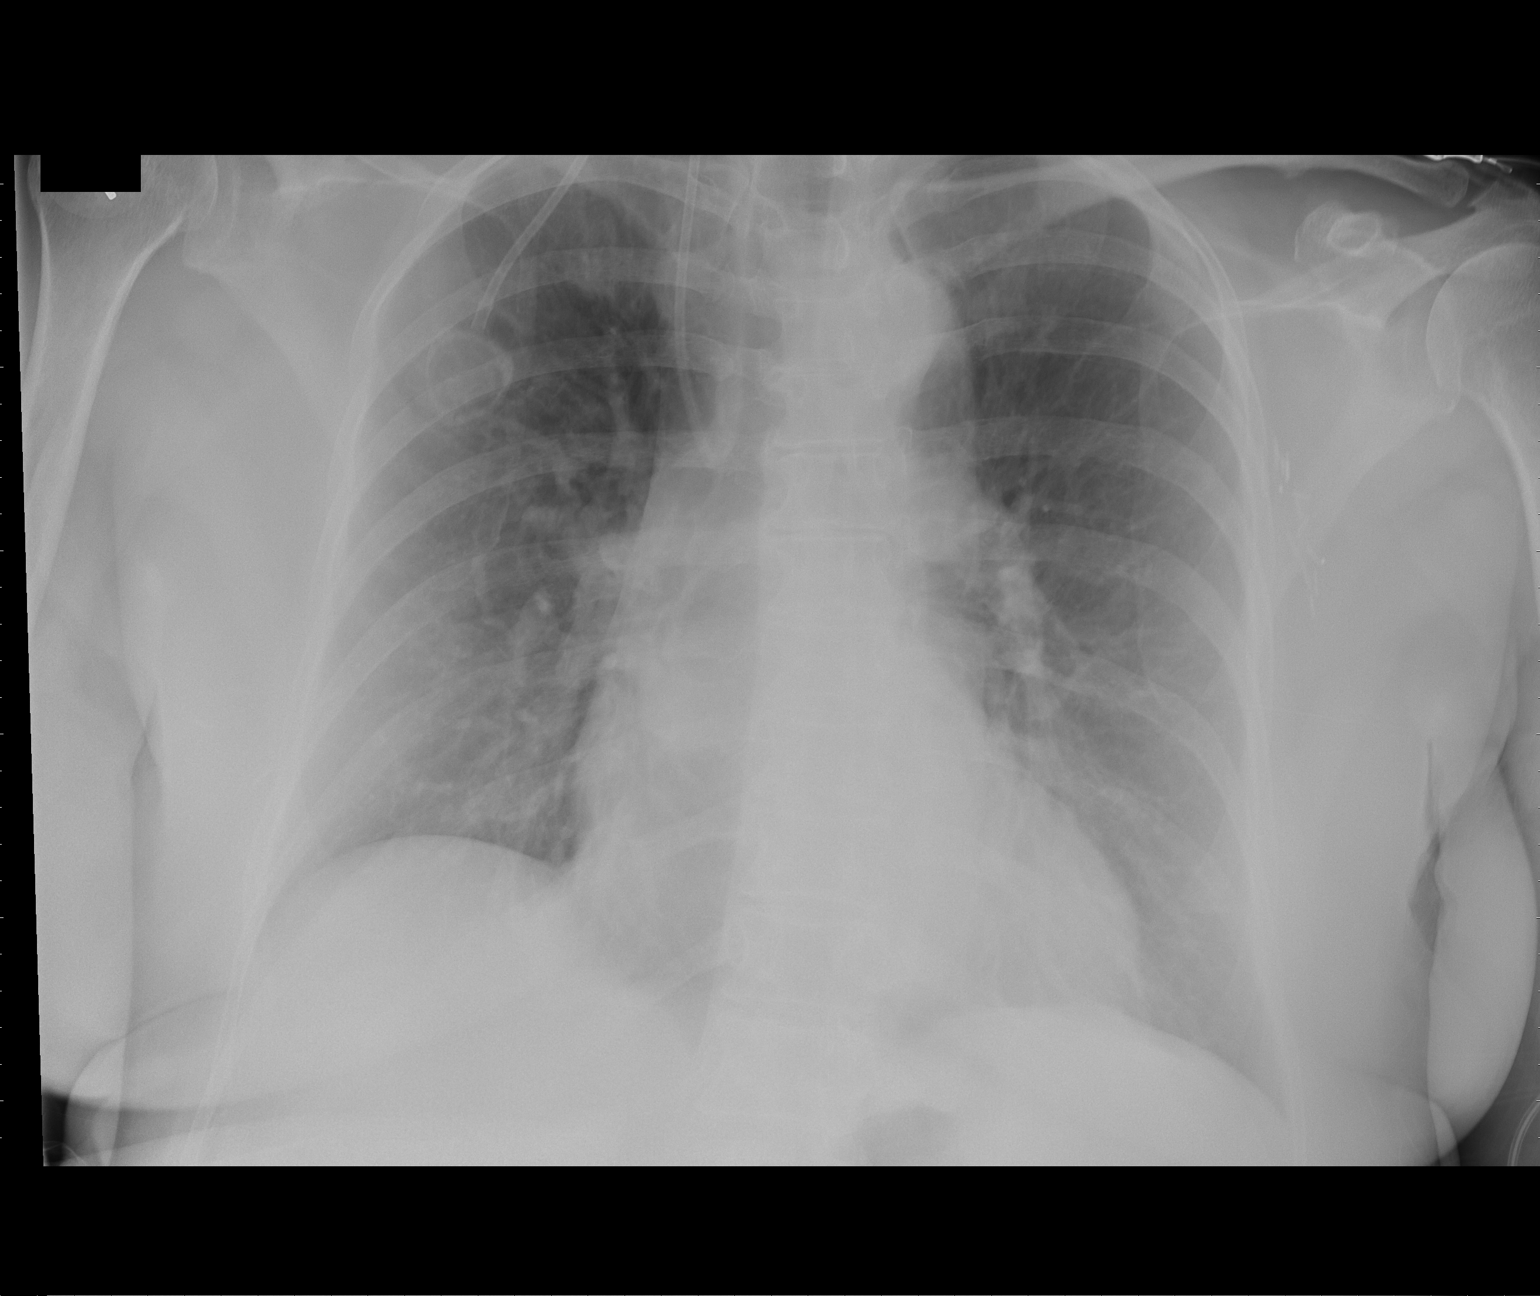

[1 of 1 positions shown; findings below may reference images not displayed]

FINDINGS: Right Port-A-Cath has been placed with the tip in the SVC. No
pneumothorax. Heart is normal size. No confluent airspace opacities
or effusions. No acute bony abnormality. Surgical clips in the left
axilla.
IMPRESSION: Right Port-A-Cath tip in the SVC.  No pneumothorax.

## 2016-01-30 ENCOUNTER — Ambulatory Visit: Payer: Self-pay | Admitting: Radiation Oncology

## 2016-02-06 ENCOUNTER — Encounter: Payer: Self-pay | Admitting: Radiation Oncology

## 2016-02-06 ENCOUNTER — Ambulatory Visit
Admission: RE | Admit: 2016-02-06 | Discharge: 2016-02-06 | Disposition: A | Discharge status: Home / Self Care | Payer: BLUE CROSS/BLUE SHIELD | Source: Ambulatory Visit | Attending: Radiation Oncology | Admitting: Radiation Oncology

## 2016-02-06 VITALS — BP 119/72 | HR 71 | Temp 97.8°F | Ht 64.0 in | Wt 150.5 lb

## 2016-02-06 DIAGNOSIS — C50911 Malignant neoplasm of unspecified site of right female breast: Secondary | ICD-10-CM | POA: Diagnosis present

## 2016-02-06 DIAGNOSIS — Y842 Radiological procedure and radiotherapy as the cause of abnormal reaction of the patient, or of later complication, without mention of misadventure at the time of the procedure: Secondary | ICD-10-CM | POA: Diagnosis not present

## 2016-02-06 DIAGNOSIS — C50511 Malignant neoplasm of lower-outer quadrant of right female breast: Secondary | ICD-10-CM

## 2016-02-06 NOTE — Progress Notes (Signed)
Natalie Valdez here for follow up after treatment to her right breast.  She denies having pain.  She reports feeling fatigued in the evenings.  She is taking Arimidex.  The skin on her right breast is intact.  She has an inverted nipple and said she has noticed that it is more inverted.  BP 119/72 mmHg  Pulse 71  Temp(Src) 97.8 F (36.6 C) (Oral)  Ht 5\' 4"  (1.626 m)  Wt 150 lb 8 oz (68.266 kg)  BMI 25.82 kg/m2  SpO2 100%  LMP 11/07/2004   Wt Readings from Last 3 Encounters:  02/06/16 150 lb 8 oz (68.266 kg)  01/07/16 145 lb 1.6 oz (65.817 kg)  10/29/15 143 lb 1.6 oz (64.91 kg)

## 2016-02-06 NOTE — Progress Notes (Signed)
Radiation Oncology         [phone] ________________________________  Name: Natalie Valdez MRN: [id number]  Date: 02/06/2016  DOB: 1951/06/11  Follow-Up Visit Note  CC: Natalie Coma, MD  Natalie Lose, MD   Diagnosis: Stage IIB (T2 N1 M0) invasive ductal carcinoma/DCIS of the right breast  Interval Since Last Radiation: 7 months  06/04/2015 through 07/12/2015: Right breast and regional lymph nodes 5040 cGy in 28 sessions (no boost)  Status post 5000 cGy 25 sessions to the left breast followed by an electron beam boost for an additional 1000 cGy in 5 sessions, completing all radiation therapy on 03/22/2009.  Narrative:  The patient returns today for routine follow-up. She denies having pain. She reports feeling fatigued in the evenings.She is taking Arimidex.She has an inverted nipple and said she has noticed that it is more inverted. She reports nipple discharge, but it has stopped. She had a mammogram in March that was negative from [facility]. We have requested a copy of this report for our records.  ALLERGIES:  has No Known Allergies.  Meds: Current Outpatient Prescriptions  Medication Sig Dispense Refill  . anastrozole (ARIMIDEX) 1 MG tablet Take 1 tablet (1 mg total) by mouth daily. 90 tablet 3  . Atorvastatin Calcium (INVESTIGATIONAL ATORVASTATIN/PLACEBO) 40 MG tablet [facility] [id number] Take 1 tablet by mouth daily. Take 1 tablet daily with or without food. 180 tablet 0  . Atorvastatin Calcium (INVESTIGATIONAL ATORVASTATIN/PLACEBO) 40 MG tablet [facility] [id number] Take 1 tablet by mouth daily. Take 1 tablet daily with or without food. 180 tablet 0  . celecoxib (CELEBREX) 200 MG capsule Take 200 mg by mouth 1 day or 1 dose.    . citalopram (CELEXA) 20 MG tablet Take 20 mg by mouth every evening.   11  . lidocaine-prilocaine (EMLA) cream APP EXT AA 1 TIME  3  . RESTASIS 0.05 % ophthalmic emulsion Place into both eyes 2 (two) times daily.   9  .  zolpidem (AMBIEN) 10 MG tablet   5  . Cyanocobalamin (B-12 PO) Take 1 tablet by mouth daily at 8 pm. Reported on 02/06/2016    . omeprazole (PRILOSEC) 40 MG capsule TAKE 1 CAPSULE(40 MG) BY MOUTH DAILY (Patient not taking: Reported on 02/06/2016) 30 capsule 5  . traMADol (ULTRAM) 50 MG tablet Take by mouth 3 (three) times daily as needed. Reported on 02/06/2016     No current facility-administered medications for this encounter.    Physical Findings: The patient is in no acute distress. Patient is alert and oriented.  height is 5\' 4"  (1.626 m) and weight is 150 lb 8 oz (68.266 kg). Her oral temperature is 97.8 F (36.6 C). Her blood pressure is 119/72 and her pulse is 71. Her oxygen saturation is 100%.   Lungs are clear to auscultation bilaterally. Heart has regular rate and rhythm. No palpable supraclavicular or axillary adenopathy.  The patient has signs of breast reduction on both sides. Skin is well healed on the right side. Mild hyperpigmentation changes. Some induration centrally in the right breast. No dominant mass, no nipple discharge or bleeding. Right nipple inverted which has been inverted prior to surgery. Examination of the left breast reveals no dominant mass, some induration centrally in the nipple areolar complex.  Lab Findings: Lab Results  Component Value Date   WBC 3.6* 09/10/2015   HGB 12.3 09/10/2015   HCT 36.8 09/10/2015   MCV 87.5 09/10/2015   PLT 200 09/10/2015  Radiographic Findings: No results found.  Impression:  The patient is recovering from the effects of radiation. No signs of recurrence on clinical exam today.  Plan: PRN follow up in rad/onc. She could always call or see me if she has any questions or concerns. She will follow closely with Dr. Lindi Valdez and will continue on Arimidex for approximately 5 years.  ____________________________________   Natalie Promise, PhD, MD   This document serves as a record of services personally performed by Natalie Pray, MD. It was created on his behalf by Natalie Valdez, a trained medical scribe. The creation of this record is based on the scribe's personal observations and the provider's statements to them. This document has been checked and approved by the attending provider.

## 2016-02-18 ENCOUNTER — Encounter: Payer: Self-pay | Admitting: Hematology and Oncology

## 2016-02-18 ENCOUNTER — Ambulatory Visit (HOSPITAL_BASED_OUTPATIENT_CLINIC_OR_DEPARTMENT_OTHER): Payer: BLUE CROSS/BLUE SHIELD

## 2016-02-18 DIAGNOSIS — C50511 Malignant neoplasm of lower-outer quadrant of right female breast: Secondary | ICD-10-CM

## 2016-02-18 DIAGNOSIS — Z95828 Presence of other vascular implants and grafts: Secondary | ICD-10-CM

## 2016-02-18 DIAGNOSIS — Z452 Encounter for adjustment and management of vascular access device: Secondary | ICD-10-CM | POA: Diagnosis not present

## 2016-02-18 MED ORDER — HEPARIN SOD (PORK) LOCK FLUSH 100 UNIT/ML IV SOLN
500.0000 [IU] | Freq: Once | INTRAVENOUS | Status: AC | PRN
  Administered 2016-02-18: 500 [IU] via INTRAVENOUS
  Filled 2016-02-18: qty 5

## 2016-02-18 MED ORDER — SODIUM CHLORIDE 0.9 % IJ SOLN
10.0000 mL | INTRAMUSCULAR | Status: DC | PRN
  Administered 2016-02-18: 10 mL via INTRAVENOUS
  Filled 2016-02-18: qty 10

## 2016-02-18 NOTE — Patient Instructions (Signed)

## 2016-02-18 NOTE — Progress Notes (Deleted)
I received patient's check book from [place]. Belien stated someone left the check book with her and asked her to give it to me to call patient. I called Ms. Sermersheim and informed we have her check book her with (1) check remaining check# [phone]. I also informed her a receipt from [employer] and a sticky label ss#, code#. She stated she don't remember why she written the sticky and she didn't want to make a trip back for (1) check. She asked me to shred the information.

## 2016-02-18 NOTE — Progress Notes (Unsigned)
I received patient's check book from [place]. Belien stated someone left the check book with her and asked her to give it to me to call patient. I called Ms. Prats and informed we have her check book her with (1) check remaining check# [phone]. I also informed her a receipt from [employer] and a sticky label ss#, code#. She stated she don't remember why she written the sticky and she didn't want to make a trip back for (1) check. She asked me to shred the information.

## 2016-03-02 ENCOUNTER — Encounter: Payer: Self-pay | Admitting: *Deleted

## 2016-03-02 DIAGNOSIS — C50511 Malignant neoplasm of lower-outer quadrant of right female breast: Secondary | ICD-10-CM

## 2016-03-02 NOTE — Progress Notes (Signed)
03/02/2016 1515 PREVENT study Spoke with the patient by phone.  She stated she was doing well, just returned from vacation, and that she continues to take study drug daily.  She stated she has forgotten to send her medication calendars in for May-July due to "life".  She stated she would mail them to this RN in the pre-paid envelopes that she was provided.  She denied complaints.  This RN thanked the patient for her time.

## 2016-04-06 ENCOUNTER — Telehealth: Payer: Self-pay | Admitting: Oncology

## 2016-04-06 NOTE — Telephone Encounter (Signed)
04/06/16 - Called and spoke to patient on phone reminding her to mail in her May - August prevent calendars to [place].  Patient states she does not have any return envelopes.  I told her I will mail some out to her so she can send in her calendars. Natalie Valdez 04/06/16 - 4:50 pm

## 2016-04-14 ENCOUNTER — Telehealth: Payer: Self-pay | Admitting: Hematology and Oncology

## 2016-04-14 ENCOUNTER — Telehealth: Payer: Self-pay | Admitting: *Deleted

## 2016-04-14 NOTE — Telephone Encounter (Signed)
04/20/2016 Flush appointment scheduled for patient. Patient was overdue for scheduled appointment. Patient stated that she could not come in for an appointment until 09/25.

## 2016-04-14 NOTE — Telephone Encounter (Signed)
"  I need an appointment for port-a-cath flush."  Call transferred to scheduler.

## 2016-04-20 ENCOUNTER — Ambulatory Visit (HOSPITAL_BASED_OUTPATIENT_CLINIC_OR_DEPARTMENT_OTHER): Payer: BLUE CROSS/BLUE SHIELD

## 2016-04-20 DIAGNOSIS — C50511 Malignant neoplasm of lower-outer quadrant of right female breast: Secondary | ICD-10-CM

## 2016-04-20 DIAGNOSIS — Z452 Encounter for adjustment and management of vascular access device: Secondary | ICD-10-CM | POA: Diagnosis not present

## 2016-04-20 DIAGNOSIS — Z95828 Presence of other vascular implants and grafts: Secondary | ICD-10-CM

## 2016-04-20 MED ORDER — HEPARIN SOD (PORK) LOCK FLUSH 100 UNIT/ML IV SOLN
500.0000 [IU] | Freq: Once | INTRAVENOUS | Status: AC | PRN
  Administered 2016-04-20: 500 [IU] via INTRAVENOUS
  Filled 2016-04-20: qty 5

## 2016-04-20 MED ORDER — SODIUM CHLORIDE 0.9 % IJ SOLN
10.0000 mL | INTRAMUSCULAR | Status: DC | PRN
  Administered 2016-04-20: 10 mL via INTRAVENOUS
  Filled 2016-04-20: qty 10

## 2016-04-29 ENCOUNTER — Telehealth: Payer: Self-pay | Admitting: *Deleted

## 2016-04-29 ENCOUNTER — Telehealth: Payer: Self-pay | Admitting: Oncology

## 2016-04-29 NOTE — Telephone Encounter (Signed)
FYI "I had radiation that ended December 2016.  I had discharge then that stopped and has returned.  My nipple started draining a week and a half ago.  It stopped, resumed a few days ago with blood.  Stopped and again has returned.  Is it possible for drainage to return like this.   What do I need to do."  Call transferred to RT.

## 2016-04-29 NOTE — Telephone Encounter (Signed)
Natalie Valdez called and said she had clear nippled discharge in December that stopped in January.  She was told this was due to a cyst.  She said she started having nipple discharge again a week and half ago.  She said she noticed some blood in the discharge yesterday and is not sure what to do.  Advised her that Dr. Sondra Come will be notified and that we will call her back.

## 2016-04-29 NOTE — Telephone Encounter (Signed)
Called Natalie Valdez back and advised her to call her surgeon, Dr. Brantley Stage.  Natalie Valdez verbalized understanding and agreement.

## 2016-05-01 ENCOUNTER — Other Ambulatory Visit: Payer: Self-pay

## 2016-05-01 DIAGNOSIS — C50511 Malignant neoplasm of lower-outer quadrant of right female breast: Secondary | ICD-10-CM

## 2016-05-01 DIAGNOSIS — N6452 Nipple discharge: Secondary | ICD-10-CM

## 2016-05-01 NOTE — Progress Notes (Signed)
Received call from pt stating she has been having intermittent small amounts of bloody discharge from her right breast with some soreness.  Pt denies any other changes to the breast.  Last mammo done 09/2015 and per Dr. Lindi Adie pt should have evaluation by diagnostic mammo of right breast.  Called to discuss with pt and order entered.  Offered to call and schedule first available appt but pt wishes to call and work out with her schedule.  Pt given contact information and verbalized understanding to contact us with additional questions or concerns. Order printed and faxed to [facility].

## 2016-05-05 ENCOUNTER — Ambulatory Visit: Payer: Self-pay | Admitting: Rheumatology

## 2016-05-20 ENCOUNTER — Telehealth: Payer: Self-pay | Admitting: *Deleted

## 2016-05-20 NOTE — Telephone Encounter (Signed)
Per MD, instructed pt to be further evaluated by surgeons office. Pt verbalized understanding.

## 2016-05-20 NOTE — Telephone Encounter (Signed)
Pt called requesting a call back from nurse.   Triage nurse called pt back and was informed that pt had mammogram and a sonogram at [facility]  1 1/2 weeks ago.   Pt was instructed by the center that she would be contacted for an appt with Dr. Lindi Adie soon.   Pt called wanting to know when her appt is. Stated she has experienced bloody discharge from nipple of Right breast.  Pt is concerned and would like to see Dr. Lindi Adie soon. Pt's   Phone    [phone].

## 2016-05-28 ENCOUNTER — Other Ambulatory Visit: Payer: Self-pay | Admitting: Surgery

## 2016-05-28 DIAGNOSIS — N6452 Nipple discharge: Secondary | ICD-10-CM

## 2016-06-04 ENCOUNTER — Telehealth: Payer: Self-pay | Admitting: Oncology

## 2016-06-04 NOTE — Telephone Encounter (Signed)
06/04/16 - Called and spoke to patient on phone to remind her of her research apt tomorrow at 10:30 am. Reminded her to bring in her prevent study drug and calendars.  Patient said she would. Remer Macho 06/04/16 - 1:00 pm

## 2016-06-05 ENCOUNTER — Encounter: Payer: BLUE CROSS/BLUE SHIELD | Admitting: *Deleted

## 2016-06-05 DIAGNOSIS — C50511 Malignant neoplasm of lower-outer quadrant of right female breast: Secondary | ICD-10-CM

## 2016-06-05 NOTE — Progress Notes (Signed)
06/05/2016 1115  18 month PREVENT study The patient comes in to [facility] alone today for 18 month PREVENT study visit.  She completed the 18 mos neurocog. booklet.  She is scheduled to see Dr.Gudena again in December.  She continues to take her study drug once a day as instructed and denies any difficulty taking it.  She denies any myalgias, muscle aches/pains.  She denies abdominal pain. She denies headache or other symptoms that are related to the study drug.  She states that she has been having bloody nipple discharge from her right breast and that the surgeon is following her with plans for a breast MRI on 06/15/16.  She reports that Dr. Lindi Adie is aware of this.  She reports no changes or worsening in breast discharge since last communication with MD about it. She denies having any problems completing her medication calendars.  She returned her study drug bottle, kit # [id number].  It contained 3 pills.  The bottle was returned to pharmacy and pill count verified by Kennith Center, Pharm. D.  A new bottle, kit # [id number], was checked and dispensed by Kennith Center Pharm. D. The patient was given the new bottle of pills and medication calendars for Dec. 2017, Jan.2018 through May 2018.  The patient was without any questions.  She was reminded to call for any side effects.  This RN thanked the patient for her continued participation on study.  Marcellus Scott, RN, BSN, MHA, OCN

## 2016-06-15 ENCOUNTER — Inpatient Hospital Stay: Admission: RE | Admit: 2016-06-15 | Payer: BLUE CROSS/BLUE SHIELD | Source: Ambulatory Visit

## 2016-06-15 ENCOUNTER — Ambulatory Visit (HOSPITAL_BASED_OUTPATIENT_CLINIC_OR_DEPARTMENT_OTHER): Payer: BLUE CROSS/BLUE SHIELD

## 2016-06-15 DIAGNOSIS — C50511 Malignant neoplasm of lower-outer quadrant of right female breast: Secondary | ICD-10-CM | POA: Diagnosis not present

## 2016-06-15 DIAGNOSIS — Z95828 Presence of other vascular implants and grafts: Secondary | ICD-10-CM

## 2016-06-15 DIAGNOSIS — Z452 Encounter for adjustment and management of vascular access device: Secondary | ICD-10-CM | POA: Diagnosis not present

## 2016-06-15 MED ORDER — SODIUM CHLORIDE 0.9 % IJ SOLN
10.0000 mL | INTRAMUSCULAR | Status: DC | PRN
  Administered 2016-06-15: 10 mL via INTRAVENOUS
  Filled 2016-06-15: qty 10

## 2016-06-15 MED ORDER — HEPARIN SOD (PORK) LOCK FLUSH 100 UNIT/ML IV SOLN
500.0000 [IU] | Freq: Once | INTRAVENOUS | Status: AC | PRN
  Administered 2016-06-15: 500 [IU] via INTRAVENOUS
  Filled 2016-06-15: qty 5

## 2016-07-06 NOTE — Assessment & Plan Note (Signed)
Right lumpectomy 11/27/2014: Invasive ductal carcinoma grade 3, 3.2 cm, with high-grade DCIS, LVI present,, 1/1 intramammary lymph node positive, 1 SLN micro-met, stage IIB, T2 N1a ER 100%, PR 12%, HER-2 negative, Ki-67 51% Patient is enrolled on PREVENT clinical trial Lipitor versus placebo S/P Adj chemo AC x 4 foll by Abraxane weekly x 12 (started 12/28/14 completed 05/14/15)  Current treatment: Anastrozole 1 mg daily 5-10 years Anastrozole toxicities:  1. Dry mouth Denies any hot flashes or myalgias  PREVENT Trial: No toxicities from the study drug.  RTC in one year for follow-up. Patient is very keen on getting a mammogram or ultrasound every 6 months. I discussed with her that every 6 months is not standard of care. However if she has any symptoms we'll be evaluating her intermittently as needed. She may look to see if she wants to pay out of pocket for the mammograms that she wants to do every 6 months.

## 2016-07-07 ENCOUNTER — Encounter: Payer: Self-pay | Admitting: Hematology and Oncology

## 2016-07-07 ENCOUNTER — Ambulatory Visit (HOSPITAL_BASED_OUTPATIENT_CLINIC_OR_DEPARTMENT_OTHER): Payer: BLUE CROSS/BLUE SHIELD | Admitting: Hematology and Oncology

## 2016-07-07 DIAGNOSIS — C50511 Malignant neoplasm of lower-outer quadrant of right female breast: Secondary | ICD-10-CM | POA: Diagnosis not present

## 2016-07-07 DIAGNOSIS — Z170 Estrogen receptor positive status [ER+]: Secondary | ICD-10-CM | POA: Diagnosis not present

## 2016-07-07 DIAGNOSIS — Z79811 Long term (current) use of aromatase inhibitors: Secondary | ICD-10-CM

## 2016-07-07 NOTE — Progress Notes (Signed)
Patient Care Team: Jonathon Jordan, MD as PCP - General (Family Medicine) Carola Frost, RN as Registered Nurse Nicholas Lose, MD as Consulting Physician (Hematology and Oncology) Arloa Koh, MD as Consulting Physician (Radiation Oncology) Erroll Luna, MD as Consulting Physician (General Surgery) Sylvan Cheese, NP as Nurse Practitioner (Hematology and Oncology)  DIAGNOSIS:  Encounter Diagnosis  Name Primary?  . Malignant neoplasm of lower-outer quadrant of right breast of female, estrogen receptor positive ([place])     SUMMARY OF ONCOLOGIC HISTORY:   Breast cancer of lower-outer quadrant of right female breast ([place])   11/06/2008 Surgery    Left breast lumpectomy: High-grade DCIS 2.8 cm, 18 lymph nodes negative, ER 98%, PR 81%, stage 0, patient refused antiestrogen therapy      10/17/2014 Breast US    Right breast: 2.5 cm irregular solid mass at 11:00, posterior depth      10/18/2014 Initial Biopsy    Right breast invasive ductal carcinoma with DCIS grade 3, lymphovascular invasion, ER 100%, PR 12%, HER-2 negative ratio 1.4, Ki-67 51%      10/18/2014 Clinical Stage    Stage IIA: T2 N0 M0      10/24/2014 Breast MRI    Right breast irregular enhancing mass 2.9 x 2.6 x 2.4 cm, left breast evidence of prior lumpectomy and left axillary dissection      11/27/2014 Surgery    Right lumpectomy: Invasive ductal carcinoma grade 3, 3.2 cm, with high-grade DCIS, LVI present,, 1/1 intramammary lymph node positive, 1 SLN micro-met, (2/3 total)      11/27/2014 Pathologic Stage    Stage IIB: T2 N1a      12/28/2014 - 05/10/2015 Chemotherapy    Adjuvant dose dense Adriamycin and Cytoxan X 4 followed by Abraxane weekly 12      06/04/2015 - 07/12/2015 Radiation Therapy    Adj XRT: Right breast and regional lymph nodes 50.4 Gy over 28 sessions (no boost)      08/01/2015 -  Anti-estrogen oral therapy    Anastrozole 1 mg daily 5-10 years      10/29/2015 Survivorship   Survivorship visit completed       CHIEF COMPLIANT: Follow-up on Arimidex therapy  INTERVAL HISTORY: Natalie Valdez is a 65 year old with above-mentioned history of right breast cancer treated with lumpectomy followed by adjuvant chemotherapy and radiation. She is currently on oral anastrozole. She is tolerating anastrozole extremely well. She complains of dry mouth but denies any hot flashes or myalgias. She had bloody nipple discharge in the right breast and underwent a mammogram and ultrasound on 05/04/2016. There is a benign 2.4 cm oval fluid collection in the right breast there is also benign ductal ectasia. After the ultrasound it was felt to be benign and a follow-up mammogram in 5 months was recommended. Patient saw Dr. Brantley Stage who is planning to obtain an MRI of the breast next month.  REVIEW OF SYSTEMS:   Constitutional: Denies fevers, chills or abnormal weight loss Eyes: Denies blurriness of vision Ears, nose, mouth, throat, and face: Dry mouth Respiratory: Denies cough, dyspnea or wheezes Cardiovascular: Denies palpitation, chest discomfort Gastrointestinal:  Denies nausea, heartburn or change in bowel habits Skin: Denies abnormal skin rashes Lymphatics: Denies new lymphadenopathy or easy bruising Neurological:Denies numbness, tingling or new weaknesses Behavioral/Psych: Mood is stable, no new changes  Extremities: No lower extremity edema Breast:  Recent bloody nipple discharge requiring mammogram and ultrasound on the right breast All other systems were reviewed with the patient and are negative.  I  have reviewed the past medical history, past surgical history, social history and family history with the patient and they are unchanged from previous note.  ALLERGIES:  has No Known Allergies.  MEDICATIONS:  Current Outpatient Prescriptions  Medication Sig Dispense Refill  . anastrozole (ARIMIDEX) 1 MG tablet Take 1 tablet (1 mg total) by mouth daily. 90 tablet 3  .  Atorvastatin Calcium (INVESTIGATIONAL ATORVASTATIN/PLACEBO) 40 MG tablet [facility] [address] Take 1 tablet by mouth daily. Take 1 tablet daily with or without food. 180 tablet 0  . celecoxib (CELEBREX) 200 MG capsule Take 200 mg by mouth 1 day or 1 dose.    . citalopram (CELEXA) 20 MG tablet Take 20 mg by mouth every evening.   11  . Cyanocobalamin (B-12 PO) Take 1 tablet by mouth daily at 8 pm. Reported on 02/06/2016    . lidocaine-prilocaine (EMLA) cream APP EXT AA 1 TIME  3  . omeprazole (PRILOSEC) 40 MG capsule TAKE 1 CAPSULE(40 MG) BY MOUTH DAILY (Patient not taking: Reported on 02/06/2016) 30 capsule 5  . RESTASIS 0.05 % ophthalmic emulsion Place into both eyes 2 (two) times daily.   9  . traMADol (ULTRAM) 50 MG tablet Take by mouth 3 (three) times daily as needed. Reported on 02/06/2016    . zolpidem (AMBIEN) 10 MG tablet   5   No current facility-administered medications for this visit.     PHYSICAL EXAMINATION: ECOG PERFORMANCE STATUS: 1 - Symptomatic but completely ambulatory  Vitals:   07/07/16 1018  BP: 112/67  Pulse: 65  Resp: 18  Temp: 97.6 F (36.4 C)   Filed Weights   07/07/16 1018  Weight: 166 lb 1.6 oz (75.3 kg)    GENERAL:alert, no distress and comfortable SKIN: skin color, texture, turgor are normal, no rashes or significant lesions EYES: normal, Conjunctiva are pink and non-injected, sclera clear OROPHARYNX:no exudate, no erythema and lips, buccal mucosa, and tongue normal  NECK: supple, thyroid normal size, non-tender, without nodularity LYMPH:  no palpable lymphadenopathy in the cervical, axillary or inguinal LUNGS: clear to auscultation and percussion with normal breathing effort HEART: regular rate & rhythm and no murmurs and no lower extremity edema ABDOMEN:abdomen soft, non-tender and normal bowel sounds MUSCULOSKELETAL:no cyanosis of digits and no clubbing  NEURO: alert & oriented x 3 with fluent speech, no focal motor/sensory deficits EXTREMITIES: No  lower extremity edema  LABORATORY DATA:  I have reviewed the data as listed   Chemistry      Component Value Date/Time   NA 141 09/10/2015 1036   K 3.7 09/10/2015 1036   CL 103 11/26/2014 1508   CO2 25 09/10/2015 1036   BUN 14.3 09/10/2015 1036   CREATININE 0.8 09/10/2015 1036      Component Value Date/Time   CALCIUM 9.2 09/10/2015 1036   ALKPHOS 92 09/10/2015 1036   AST 18 09/10/2015 1036   ALT 20 09/10/2015 1036   BILITOT 0.35 09/10/2015 1036       Lab Results  Component Value Date   WBC 3.6 (L) 09/10/2015   HGB 12.3 09/10/2015   HCT 36.8 09/10/2015   MCV 87.5 09/10/2015   PLT 200 09/10/2015   NEUTROABS 2.5 09/10/2015    ASSESSMENT & PLAN:  Breast cancer of lower-outer quadrant of right female breast Right lumpectomy 11/27/2014: Invasive ductal carcinoma grade 3, 3.2 cm, with high-grade DCIS, LVI present,, 1/1 intramammary lymph node positive, 1 SLN micro-met, stage IIB, T2 N1a ER 100%, PR 12%, HER-2 negative, Ki-67 51% Patient  is enrolled on PREVENT clinical trial Lipitor versus placebo S/P Adj chemo AC x 4 foll by Abraxane weekly x 12 (started 12/28/14 completed 05/14/15)  Current treatment: Anastrozole 1 mg daily 10 years Anastrozole toxicities:  1. Dry mouth Denies any hot flashes or myalgias  PREVENT Trial: No toxicities from the study drug.  Patient is very keen on getting a mammogram or ultrasound every 6 months.  Right breast bloody nipple discharge: Mammogram and ultrasound 05/04/2016 revealed a 2.4 cm oval fluid collection representing a seroma and is benign. There was duct ectasia in the right breast as well. This was feltalso to be benign and a 5 month follow-up mammogram was recommended by [facility]. Dr. Brantley Stage is obtaining a breast MRI in January 2018.  Patient's mother was diagnosed with metastatic lung cancer recently and she does not think she has a lot of time. She takes care of for 2 grandchildren ages 19 and 61-year-old son works full-time and  stays very busy. She does not have time for exercise.  RTC in 6 months for follow-up. No orders of the defined types were placed in this encounter.  The patient has a good understanding of the overall plan. she agrees with it. she will call with any problems that may develop before the next visit here.   Rulon Eisenmenger, MD 07/07/16

## 2016-07-13 ENCOUNTER — Other Ambulatory Visit: Payer: Self-pay | Admitting: Hematology and Oncology

## 2016-07-13 DIAGNOSIS — C50511 Malignant neoplasm of lower-outer quadrant of right female breast: Secondary | ICD-10-CM

## 2016-07-16 ENCOUNTER — Other Ambulatory Visit: Payer: Self-pay | Admitting: *Deleted

## 2016-07-16 DIAGNOSIS — Z170 Estrogen receptor positive status [ER+]: Principal | ICD-10-CM

## 2016-07-16 DIAGNOSIS — C50511 Malignant neoplasm of lower-outer quadrant of right female breast: Secondary | ICD-10-CM

## 2016-07-16 MED ORDER — INV-ATORVASTATIN/PLACEBO 40 MG TABS WAKE FOREST WF 98213: 1.0000 | ORAL_TABLET | Freq: Every day | ORAL | 0 refills | Status: DC

## 2016-07-18 ENCOUNTER — Other Ambulatory Visit: Payer: Self-pay | Admitting: Nurse Practitioner

## 2016-07-28 ENCOUNTER — Telehealth: Payer: Self-pay | Admitting: Oncology

## 2016-07-28 NOTE — Telephone Encounter (Signed)
Called and spoke to patient on phone.  Called to remind patient to mail in her Prevent medication diaries for Nov and Dec 2017.  Patient said OK. Remer Macho 07/28/16 - 1:50 pm

## 2016-08-04 ENCOUNTER — Ambulatory Visit (HOSPITAL_BASED_OUTPATIENT_CLINIC_OR_DEPARTMENT_OTHER): Payer: BLUE CROSS/BLUE SHIELD

## 2016-08-04 ENCOUNTER — Encounter: Payer: Self-pay | Admitting: Oncology

## 2016-08-04 ENCOUNTER — Encounter: Payer: Self-pay | Admitting: Hematology and Oncology

## 2016-08-04 DIAGNOSIS — C50511 Malignant neoplasm of lower-outer quadrant of right female breast: Secondary | ICD-10-CM | POA: Diagnosis not present

## 2016-08-04 DIAGNOSIS — Z452 Encounter for adjustment and management of vascular access device: Secondary | ICD-10-CM | POA: Diagnosis not present

## 2016-08-04 DIAGNOSIS — Z95828 Presence of other vascular implants and grafts: Secondary | ICD-10-CM

## 2016-08-04 MED ORDER — SODIUM CHLORIDE 0.9 % IJ SOLN
10.0000 mL | INTRAMUSCULAR | Status: DC | PRN
  Administered 2016-08-04: 10 mL via INTRAVENOUS
  Filled 2016-08-04: qty 10

## 2016-08-04 MED ORDER — HEPARIN SOD (PORK) LOCK FLUSH 100 UNIT/ML IV SOLN
500.0000 [IU] | Freq: Once | INTRAVENOUS | Status: AC | PRN
  Administered 2016-08-04: 500 [IU] via INTRAVENOUS
  Filled 2016-08-04: qty 5

## 2016-08-04 NOTE — Progress Notes (Signed)
Patient into cancer center for flush apt.  Met patient in lobby to give her new calendars for the prevent study. Introduced the Chartered loss adjuster taking over her chart from the nurse who just left the dept.  She stated she mailed her old calendars in on Thursday 07/30/16. Remer Macho 08/04/16 - 10:00 am

## 2016-08-14 ENCOUNTER — Other Ambulatory Visit (HOSPITAL_COMMUNITY)
Admission: RE | Admit: 2016-08-14 | Discharge: 2016-08-14 | Disposition: A | Discharge status: Home / Self Care | Payer: BLUE CROSS/BLUE SHIELD | Source: Ambulatory Visit | Attending: Family Medicine | Admitting: Family Medicine

## 2016-08-14 ENCOUNTER — Other Ambulatory Visit: Payer: Self-pay | Admitting: Family Medicine

## 2016-08-14 DIAGNOSIS — Z01411 Encounter for gynecological examination (general) (routine) with abnormal findings: Secondary | ICD-10-CM | POA: Insufficient documentation

## 2016-08-14 DIAGNOSIS — Z1151 Encounter for screening for human papillomavirus (HPV): Secondary | ICD-10-CM | POA: Diagnosis not present

## 2016-08-18 NOTE — Progress Notes (Signed)
Received mm results from [facility]. Sent to scan

## 2016-09-04 LAB — CYTOLOGY - PAP
DIAGNOSIS: NEGATIVE
HPV: NOT DETECTED

## 2016-09-22 ENCOUNTER — Telehealth: Payer: Self-pay | Admitting: Rheumatology

## 2016-09-22 NOTE — Telephone Encounter (Signed)
Left message on machine for patient to call back to schedule Euflexxa injections that we have at the office.

## 2016-09-23 ENCOUNTER — Telehealth: Payer: Self-pay | Admitting: Oncology

## 2016-09-23 NOTE — Telephone Encounter (Signed)
09/23/16 - Called and left patient a message to send in her Jan. & Feb. Prevent calendars. Natalie Valdez 09/23/16 - 2:00 pm

## 2016-09-28 ENCOUNTER — Other Ambulatory Visit: Payer: Self-pay | Admitting: Hematology and Oncology

## 2016-09-28 DIAGNOSIS — C50511 Malignant neoplasm of lower-outer quadrant of right female breast: Secondary | ICD-10-CM

## 2016-09-30 ENCOUNTER — Ambulatory Visit (HOSPITAL_BASED_OUTPATIENT_CLINIC_OR_DEPARTMENT_OTHER): Payer: BLUE CROSS/BLUE SHIELD

## 2016-09-30 ENCOUNTER — Other Ambulatory Visit: Payer: Self-pay | Admitting: Emergency Medicine

## 2016-09-30 DIAGNOSIS — Z452 Encounter for adjustment and management of vascular access device: Secondary | ICD-10-CM | POA: Diagnosis not present

## 2016-09-30 DIAGNOSIS — Z95828 Presence of other vascular implants and grafts: Secondary | ICD-10-CM

## 2016-09-30 DIAGNOSIS — C50511 Malignant neoplasm of lower-outer quadrant of right female breast: Secondary | ICD-10-CM

## 2016-09-30 MED ORDER — SODIUM CHLORIDE 0.9 % IJ SOLN
10.0000 mL | INTRAMUSCULAR | Status: DC | PRN
  Administered 2016-09-30: 10 mL via INTRAVENOUS
  Filled 2016-09-30: qty 10

## 2016-09-30 MED ORDER — ANASTROZOLE 1 MG PO TABS: ORAL_TABLET | ORAL | 3 refills | Status: DC

## 2016-09-30 MED ORDER — HEPARIN SOD (PORK) LOCK FLUSH 100 UNIT/ML IV SOLN
500.0000 [IU] | Freq: Once | INTRAVENOUS | Status: AC | PRN
  Administered 2016-09-30: 500 [IU] via INTRAVENOUS
  Filled 2016-09-30: qty 5

## 2016-10-23 ENCOUNTER — Telehealth: Payer: Self-pay | Admitting: Oncology

## 2016-10-23 NOTE — Telephone Encounter (Signed)
10/23/16 - Called and spoke to patient on the phone.  Asked her to mail in her prevent calendars.  She stated she had already put them in the mail either yesterday or day before.  Thanked her for sending them in to [place]. Remer Macho 10/23/16 - 1:05 pm

## 2016-11-05 ENCOUNTER — Telehealth: Payer: Self-pay | Admitting: *Deleted

## 2016-11-05 NOTE — Telephone Encounter (Signed)
11/05/16 at 2:36 pm - The research nurse left the pt a detailed voice message asking her to return the nurse's call regarding the PREVENT study.  The research nurse explained that the pt needs her month 24 cardiac MRI done before 12/01/16.  The pt was informed that the study prefers that patients be on their study drug at the time of their cardiac MRI.  The pt's study drug is due to run out on 12/01/16. The research nurse will await the pt's return call.   Brion Aliment RN, BSN, CCRP Clinical Research Nurse 11/05/2016 2:39 PM

## 2016-11-25 ENCOUNTER — Encounter: Payer: Self-pay | Admitting: *Deleted

## 2016-11-25 ENCOUNTER — Other Ambulatory Visit: Payer: Self-pay | Admitting: Hematology and Oncology

## 2016-11-25 DIAGNOSIS — C50511 Malignant neoplasm of lower-outer quadrant of right female breast: Secondary | ICD-10-CM

## 2016-11-25 DIAGNOSIS — Z006 Encounter for examination for normal comparison and control in clinical research program: Secondary | ICD-10-CM

## 2016-11-25 DIAGNOSIS — Z170 Estrogen receptor positive status [ER+]: Principal | ICD-10-CM

## 2016-11-25 NOTE — Progress Notes (Signed)
11/25/16 at 4:20pm - PREVENT/[facility] [phone]- The research nurse called the pt to discuss her month 24 assessments.  The research nurse informed the pt that Amendment #21 states "24 month MRI should be obtained within 30 days prior to the last dose of study medication".  The pt confirmed that she would be taking her last dose of study drug next week.  The pt was told that her final cardiac MRI needs to be scheduled next week.  The pt agreed to have her cardiac MRI done on 12/01/16 at 2 pm.  The pt was informed to return all of her study drug and study drug bottles along with all of her medication calendars on Tuesday.  The pt verbalized understanding.  The pt was told that her month 24 research labs and 24 month booklet could be done on 01/05/2017 at her regular scheduled appointment with Dr. Lindi Adie.  The pt asked the research nurse to schedule a flush appointment to have her research labs drawn from her port.  The research nurse will see the pt prior to her cardiac MRI to collect her medication calendars and any remaining study drug. Brion Aliment RN, BSN, CCRP Clinical Research Nurse 11/25/2016 4:30 PM

## 2016-12-01 ENCOUNTER — Ambulatory Visit (HOSPITAL_COMMUNITY)
Admission: RE | Admit: 2016-12-01 | Discharge: 2016-12-01 | Disposition: A | Discharge status: Home / Self Care | Payer: BLUE CROSS/BLUE SHIELD | Source: Ambulatory Visit | Attending: Hematology and Oncology | Admitting: Hematology and Oncology

## 2016-12-01 DIAGNOSIS — Z006 Encounter for examination for normal comparison and control in clinical research program: Secondary | ICD-10-CM | POA: Insufficient documentation

## 2016-12-02 ENCOUNTER — Ambulatory Visit
Admission: RE | Admit: 2016-12-02 | Discharge: 2016-12-02 | Disposition: A | Discharge status: Home / Self Care | Payer: BLUE CROSS/BLUE SHIELD | Source: Ambulatory Visit | Attending: Surgery | Admitting: Surgery

## 2016-12-02 DIAGNOSIS — N6452 Nipple discharge: Secondary | ICD-10-CM

## 2016-12-02 MED ORDER — GADOBENATE DIMEGLUMINE 529 MG/ML IV SOLN
15.0000 mL | Freq: Once | INTRAVENOUS | Status: AC | PRN
  Administered 2016-12-02: 15 mL via INTRAVENOUS

## 2016-12-04 ENCOUNTER — Other Ambulatory Visit: Payer: Self-pay | Admitting: Surgery

## 2016-12-04 DIAGNOSIS — R928 Other abnormal and inconclusive findings on diagnostic imaging of breast: Secondary | ICD-10-CM

## 2016-12-10 ENCOUNTER — Ambulatory Visit
Admission: RE | Admit: 2016-12-10 | Discharge: 2016-12-10 | Disposition: A | Discharge status: Home / Self Care | Payer: BLUE CROSS/BLUE SHIELD | Source: Ambulatory Visit | Attending: Surgery | Admitting: Surgery

## 2016-12-10 DIAGNOSIS — R928 Other abnormal and inconclusive findings on diagnostic imaging of breast: Secondary | ICD-10-CM

## 2016-12-10 MED ORDER — GADOBENATE DIMEGLUMINE 529 MG/ML IV SOLN: 15.0000 mL | Freq: Once | INTRAVENOUS | Status: DC | PRN

## 2016-12-18 ENCOUNTER — Ambulatory Visit: Payer: Self-pay | Admitting: Surgery

## 2016-12-18 NOTE — H&P (Signed)
Natalie Valdez 12/18/2016 10:09 AM Location: [facility] Patient #: [id number] DOB: 01-05-1951 Married / Language: [name] / Race: White Female  History of Present Illness [name] A. Caliph Borowiak MD; 12/18/2016 10:51 AM) Patient words: Patient returns for breast cancer follow-up after radiation therapy to her right breast after breast conserving surgery in 2016. She developed some bloody nipple discharge right nipple in october 2017. This is been going on off and on for a number of months became but became bloody of the last 2 months. Mammogram and ultrasound were unremarkable except showing postoperative changes from her lumpectomy. The drainage is minimal. It is intermittent. There is no breast pain or mass.   This is improved since her last visit in December 2017. I recommended MRI given her bloody nipple discharge from the site she had breast cancer in. This was delayed due to insurance denying it at that time. She is on Mammogram which showed a cluster of calcifications in the right breast adjacent to the operative site. MRI was recommended by radiology for further evaluation. The rationale for the MRI was to better visualize this given her dense breast tissue as well as previous history of bloody nipple discharge and other diagnostic tests not revealing etiology. She underwent the MRI and surgical biopsy of this area which showed fibrocystic change with microcalcifications. She's had no further problems with bloody nipple discharge or cellulitis of the right breast. She would like to have her Port-A-Cath removed and she is finished chemotherapy.                Breast cancer of lower-outer quadrant of right female breast Right lumpectomy 11/27/2014: Invasive ductal carcinoma grade 3, 3.2 cm, with high-grade DCIS, LVI present,, 1/1 intramammary lymph node positive, 1 SLN micro-met, stage IIB, T2 N1a ER 100%, PR 12%, HER-2 negative, Ki-67 51% Patient is enrolled on  PREVENT clinical trial Lipitor versus placebo S/P Adj chemo AC x 4 foll by Abraxane weekly x 12 (started 12/28/14 completed 05/14/15)        11/06/2008 Surgery Left breast lumpectomy: High-grade DCIS 2.8 cm, 18 lymph nodes negative, ER 98%, PR 81%, stage 0, patient refused antiestrogen therapy 10/17/2014 Breast US Right breast: 2.5 cm irregular solid mass at 11:00, posterior depth 10/18/2014 Initial Biopsy Right breast invasive ductal carcinoma with DCIS grade 3, lymphovascular invasion, ER 100%, PR 12%, HER-2 negative ratio 1.4, Ki-67 51% 10/18/2014 Clinical Stage Stage IIA: T2 N0 M0 10/24/2014 Breast MRI Right breast irregular enhancing mass 2.9 x 2.6 x 2.4 cm, left breast evidence of prior lumpectomy and left axillary dissection 11/27/2014 Surgery Right lumpectomy: Invasive ductal carcinoma grade 3, 3.2 cm, with high-grade DCIS, LVI present,, 1/1 intramammary lymph node positive, 1 SLN micro-met, (2/3 total) 11/27/2014 Pathologic Stage Stage IIB: T2 N1a 12/28/2014 - 05/10/2015 Chemotherapy Adjuvant dose dense Adriamycin and Cytoxan X 4 followed by Abraxane weekly 12 06/04/2015 - 07/12/2015 Radiation Therapy Adj XRT: Right breast and regional lymph nodes 50.4 Gy over 28 sessions (no boost)  08/01/2015 - Anti-estrogen oral therapy Anastrozole 1 mg daily 5-10 years 10/29/2015 Survivorship Survivorship visit completed.                                CLINICAL DATA: History of left breast cancer status post lumpectomy and radiation therapy in 2010. History of right breast cancer status post lumpectomy and radiation therapy in 2016. Patient was treated for an infected right seroma in November of 2017.  LABS: Creatinine was obtained on site at [facility] at [address] Results: Creatinine 0.7 mg/dL. EXAM: BILATERAL BREAST MRI WITH AND WITHOUT CONTRAST TECHNIQUE: Multiplanar, multisequence MR images of both breasts were obtained prior to and following the  intravenous administration of 15 ml of MultiHance. THREE-DIMENSIONAL MR IMAGE RENDERING ON INDEPENDENT WORKSTATION: Three-dimensional MR images were rendered by post-processing of the original MR data on an independent workstation. The three-dimensional MR images were interpreted, and findings are reported in the following complete MRI report for this study. Three dimensional images were evaluated at the independent Dyna[employer]ad workstation COMPARISON: Previous exam(s). FINDINGS: Breast composition: b. Scattered fibroglandular tissue. Background parenchymal enhancement: Moderate. Right breast: Lumpectomy changes are seen in the upper-outer quadrant of right breast. There is diffuse asymmetric enhancement in the upper breast and surrounding the lumpectomy site. It is felt to likely be secondary to postoperative and radiation changes. Left breast: Lumpectomy changes are seen in the left breast. There is no abnormal enhancement. Lymph nodes: No abnormal appearing lymph nodes. Ancillary findings: None. IMPRESSION: Diffuse asymmetric enhancement in the upper right breast likely secondary to lumpectomy and radiation change. RECOMMENDATION: Short-term interval follow-up MRI in 6 months is recommended. BI-RADS CATEGORY 3: Probably benign. Electronically Signed: By: Lillia Mountain M.D. On: 12/02/2016 11:13                      ADDENDUM REPORT: 12/02/2016 16:42  ADDENDUM: Upon further are evaluation the patient's MRI and comparison to prior studies there is a solitary enhancing mass in the upper-outer quadrant of the right breast measuring 10 x 5 x 6 mm. This is lateral to the lumpectomy site. It is indeterminate. It may be fat necrosis but recurrent cancer could not be excluded. MR guided core biopsy is recommended.  Recommendation: MR guided core biopsy of a right breast nodule is recommended.  Assessment: 4: Suspicious.       Diagnosis Breast, right, needle  core biopsy, UOQ - FIBROCYSTIC CHANGE AND FIBROSIS WITH CALCIFICATIONS. - NO MALIGNANCY IDENTIFIED.  The patient is a 66 year old female.   Allergies Malachy Moan, [place]; 12/18/2016 10:09 AM) No Known Drug Allergies 11/02/2014  Medication History Malachy Moan, [place]; 12/18/2016 10:11 AM) Cyanocobalamin (25MCG Tablet, Oral) Active. Restasis (0.05% Emulsion, Ophthalmic) Active. Ambien (10MG Tablet, Oral) Active. Zolpidem Tartrate (10MG Tablet, Oral) Active. Vitamin D (Ergocalciferol) (50000UNIT Capsule, Oral) Active. Medications Reconciled    Vitals Malachy Moan RMA; 12/18/2016 10:11 AM) 12/18/2016 10:11 AM Weight Measurement Declined Temp.: 98.43F  Pulse: 113 (Regular)  BP: 100/60 (Sitting, Left Arm, Standard)      Physical Exam (Elen Acero A. Brithany Whitworth MD; 12/18/2016 10:52 AM)  General Mental Status-Alert. General Appearance-Consistent with stated age. Hydration-Well hydrated. Voice-Normal.  Eye Eyeball - Bilateral-Extraocular movements intact. Sclera/Conjunctiva - Bilateral-No scleral icterus.  Chest and Lung Exam Chest and lung exam reveals -quiet, even and easy respiratory effort with no use of accessory muscles and on auscultation, normal breath sounds, no adventitious sounds and normal vocal resonance. Inspection Chest Wall - Normal. Back - normal.  Breast Note: Right breast shows postsurgical changes. Right nipple is mildly imported which is not new. There are no masses. The breast very dense on examination. Left breast is dense as well without signs of mass lesion or discharge or other abnormality. Port on right side is intact.  Neurologic Neurologic evaluation reveals -alert and oriented x 3 with no impairment of recent or remote memory. Mental Status-Normal.  Musculoskeletal Normal Exam - Left-Upper Extremity Strength Normal and  Lower Extremity Strength Normal. Normal Exam - Right-Upper Extremity Strength Normal and  Lower Extremity Strength Normal.  Lymphatic Head & Neck  General Head & Neck Lymphatics: Bilateral - Description - Normal. Axillary  General Axillary Region: Bilateral - Description - Normal. Tenderness - Non Tender.    Assessment & Plan (Jamere Stidham A. Ronrico Dupin MD; 12/18/2016 10:53 AM)  Natalie Valdez DISCHARGE FROM RIGHT NIPPLE (N64.52) Impression: MRI was done which showed the area of microcalcifications which were biopsied and showed no evidence of malignancy. Fibrocystic change noted. The MRI was warranted due to right breast bloody nipple discharge and is recommended 6 months ago. Radiologist concur with this recommendation. Other modalities were not helpful to circumstances and given her breast density and history of breast cancer MRI was the best test of the circumstance.  BREAST MASS, RIGHT (N63.10)  HISTORY OF RIGHT BREAST CANCER (Z85.3) Impression: Stable return in 1 year  PORT CATHETER IN PLACE (S88.648) Impression: set up for removal risk of bleeding infection injury to blood vessel port fragmentation will set up removal  Current Plans I recommended surgery to remove the catheter. I explained the technique of removal with use of local anesthesia & possible need for more aggressive sedation/anesthesia for patient comfort.  Risks such as bleeding, infection, and other risks were discussed. Post-operative dressing/incision care was discussed. I noted a good likelihood this will help address the problem. We will work to minimize complications. Questions were answered. The patient expresses understanding & wishes to proceed with surgery.  Pt Education - [facility] Free Text Education/Instructions: discussed with patient and provided information.

## 2016-12-23 ENCOUNTER — Encounter (HOSPITAL_BASED_OUTPATIENT_CLINIC_OR_DEPARTMENT_OTHER): Payer: Self-pay | Admitting: *Deleted

## 2016-12-23 NOTE — Progress Notes (Signed)
Boost drink given with instructions to complete by [facility], pt verbalized understanding.

## 2016-12-24 ENCOUNTER — Encounter (HOSPITAL_BASED_OUTPATIENT_CLINIC_OR_DEPARTMENT_OTHER): Payer: Self-pay | Admitting: Anesthesiology

## 2016-12-24 ENCOUNTER — Ambulatory Visit (HOSPITAL_BASED_OUTPATIENT_CLINIC_OR_DEPARTMENT_OTHER)
Admission: RE | Admit: 2016-12-24 | Discharge: 2016-12-24 | Disposition: A | Discharge status: Home / Self Care | Payer: BLUE CROSS/BLUE SHIELD | Source: Ambulatory Visit | Attending: Surgery | Admitting: Surgery

## 2016-12-24 ENCOUNTER — Ambulatory Visit (HOSPITAL_BASED_OUTPATIENT_CLINIC_OR_DEPARTMENT_OTHER): Payer: BLUE CROSS/BLUE SHIELD | Admitting: Anesthesiology

## 2016-12-24 ENCOUNTER — Encounter (HOSPITAL_BASED_OUTPATIENT_CLINIC_OR_DEPARTMENT_OTHER)
Admission: RE | Disposition: A | Discharge status: Home / Self Care | Payer: Self-pay | Source: Ambulatory Visit | Attending: Surgery

## 2016-12-24 DIAGNOSIS — C50511 Malignant neoplasm of lower-outer quadrant of right female breast: Secondary | ICD-10-CM | POA: Insufficient documentation

## 2016-12-24 DIAGNOSIS — Z923 Personal history of irradiation: Secondary | ICD-10-CM | POA: Insufficient documentation

## 2016-12-24 DIAGNOSIS — Z79899 Other long term (current) drug therapy: Secondary | ICD-10-CM | POA: Insufficient documentation

## 2016-12-24 DIAGNOSIS — Z79811 Long term (current) use of aromatase inhibitors: Secondary | ICD-10-CM | POA: Insufficient documentation

## 2016-12-24 HISTORY — PX: PORT-A-CATH REMOVAL: SHX5289

## 2016-12-24 SURGERY — REMOVAL PORT-A-CATH
Anesthesia: Monitor Anesthesia Care | Site: Chest | Laterality: Right

## 2016-12-24 MED ORDER — METOCLOPRAMIDE HCL 5 MG/ML IJ SOLN: 10.0000 mg | Freq: Once | INTRAMUSCULAR | Status: DC | PRN

## 2016-12-24 MED ORDER — FENTANYL CITRATE (PF) 100 MCG/2ML IJ SOLN
50.0000 ug | INTRAMUSCULAR | Status: DC | PRN
  Administered 2016-12-24: 100 ug via INTRAVENOUS

## 2016-12-24 MED ORDER — ONDANSETRON HCL 4 MG/2ML IJ SOLN
INTRAMUSCULAR | Status: DC | PRN
  Administered 2016-12-24: 4 mg via INTRAVENOUS

## 2016-12-24 MED ORDER — DEXTROSE 5 % IV SOLN
3.0000 g | INTRAVENOUS | Status: AC
  Administered 2016-12-24: 2 g via INTRAVENOUS

## 2016-12-24 MED ORDER — FENTANYL CITRATE (PF) 100 MCG/2ML IJ SOLN
INTRAMUSCULAR | Status: AC
  Filled 2016-12-24: qty 2

## 2016-12-24 MED ORDER — MIDAZOLAM HCL 2 MG/2ML IJ SOLN
1.0000 mg | INTRAMUSCULAR | Status: DC | PRN
  Administered 2016-12-24: 2 mg via INTRAVENOUS

## 2016-12-24 MED ORDER — CHLORHEXIDINE GLUCONATE CLOTH 2 % EX PADS: 6.0000 | MEDICATED_PAD | Freq: Once | CUTANEOUS | Status: DC

## 2016-12-24 MED ORDER — SCOPOLAMINE 1 MG/3DAYS TD PT72
MEDICATED_PATCH | TRANSDERMAL | Status: AC
  Filled 2016-12-24: qty 1

## 2016-12-24 MED ORDER — ONDANSETRON HCL 4 MG/2ML IJ SOLN
INTRAMUSCULAR | Status: AC
  Filled 2016-12-24: qty 2

## 2016-12-24 MED ORDER — BUPIVACAINE-EPINEPHRINE (PF) 0.25% -1:200000 IJ SOLN
INTRAMUSCULAR | Status: AC
  Filled 2016-12-24: qty 30

## 2016-12-24 MED ORDER — CEFAZOLIN SODIUM-DEXTROSE 2-4 GM/100ML-% IV SOLN
INTRAVENOUS | Status: AC
  Filled 2016-12-24: qty 100

## 2016-12-24 MED ORDER — MEPERIDINE HCL 25 MG/ML IJ SOLN: 6.2500 mg | INTRAMUSCULAR | Status: DC | PRN

## 2016-12-24 MED ORDER — LIDOCAINE 2% (20 MG/ML) 5 ML SYRINGE
INTRAMUSCULAR | Status: AC
  Filled 2016-12-24: qty 5

## 2016-12-24 MED ORDER — MIDAZOLAM HCL 2 MG/2ML IJ SOLN
INTRAMUSCULAR | Status: AC
  Filled 2016-12-24: qty 2

## 2016-12-24 MED ORDER — BUPIVACAINE HCL (PF) 0.25 % IJ SOLN
INTRAMUSCULAR | Status: AC
  Filled 2016-12-24: qty 60

## 2016-12-24 MED ORDER — FENTANYL CITRATE (PF) 100 MCG/2ML IJ SOLN: 25.0000 ug | INTRAMUSCULAR | Status: DC | PRN

## 2016-12-24 MED ORDER — BUPIVACAINE-EPINEPHRINE (PF) 0.25% -1:200000 IJ SOLN
INTRAMUSCULAR | Status: AC
  Filled 2016-12-24: qty 60

## 2016-12-24 MED ORDER — SCOPOLAMINE 1 MG/3DAYS TD PT72
1.0000 | MEDICATED_PATCH | Freq: Once | TRANSDERMAL | Status: DC | PRN
Start: 2016-12-24 — End: 2016-12-24
  Administered 2016-12-24: 1.5 mg via TRANSDERMAL

## 2016-12-24 MED ORDER — IBUPROFEN 800 MG PO TABS: 800.0000 mg | ORAL_TABLET | Freq: Three times a day (TID) | ORAL | 0 refills | Status: AC | PRN

## 2016-12-24 MED ORDER — PROPOFOL 10 MG/ML IV BOLUS
INTRAVENOUS | Status: DC | PRN
  Administered 2016-12-24 (×3): 20 mg via INTRAVENOUS

## 2016-12-24 MED ORDER — LIDOCAINE HCL (CARDIAC) 20 MG/ML IV SOLN
INTRAVENOUS | Status: DC | PRN
  Administered 2016-12-24: 20 mg via INTRAVENOUS

## 2016-12-24 MED ORDER — HYDROCODONE-ACETAMINOPHEN 7.5-325 MG PO TABS
1.0000 | ORAL_TABLET | Freq: Once | ORAL | Status: DC | PRN
Start: 2016-12-24 — End: 2016-12-24

## 2016-12-24 MED ORDER — LACTATED RINGERS IV SOLN
INTRAVENOUS | Status: DC
  Administered 2016-12-24: 07:00:00 via INTRAVENOUS

## 2016-12-24 MED ORDER — SCOPOLAMINE 1 MG/3DAYS TD PT72: 1.0000 | MEDICATED_PATCH | TRANSDERMAL | Status: DC

## 2016-12-24 MED ORDER — PROPOFOL 500 MG/50ML IV EMUL
INTRAVENOUS | Status: AC
  Filled 2016-12-24: qty 50

## 2016-12-24 MED ORDER — BUPIVACAINE-EPINEPHRINE 0.25% -1:200000 IJ SOLN
INTRAMUSCULAR | Status: DC | PRN
  Administered 2016-12-24: 10 mL

## 2016-12-24 SURGICAL SUPPLY — 33 items
BENZOIN TINCTURE PRP APPL 2/3 (GAUZE/BANDAGES/DRESSINGS) IMPLANT
BLADE SURG 15 STRL LF DISP TIS (BLADE) ×1 IMPLANT
BLADE SURG 15 STRL SS (BLADE) ×2
CHLORAPREP W/TINT 26ML (MISCELLANEOUS) ×3 IMPLANT
CLOSURE WOUND 1/2 X4 (GAUZE/BANDAGES/DRESSINGS)
COVER BACK TABLE 60X90IN (DRAPES) ×3 IMPLANT
COVER MAYO STAND STRL (DRAPES) ×3 IMPLANT
DECANTER SPIKE VIAL GLASS SM (MISCELLANEOUS) IMPLANT
DERMABOND ADVANCED (GAUZE/BANDAGES/DRESSINGS) ×2
DERMABOND ADVANCED .7 DNX12 (GAUZE/BANDAGES/DRESSINGS) ×1 IMPLANT
DRAPE LAPAROTOMY 100X72 PEDS (DRAPES) ×3 IMPLANT
DRAPE UTILITY XL STRL (DRAPES) ×3 IMPLANT
ELECT REM PT RETURN 9FT ADLT (ELECTROSURGICAL) ×3
ELECTRODE REM PT RTRN 9FT ADLT (ELECTROSURGICAL) ×1 IMPLANT
GLOVE BIOGEL PI IND STRL 8 (GLOVE) ×2 IMPLANT
GLOVE BIOGEL PI INDICATOR 8 (GLOVE) ×4
GLOVE ECLIPSE 8.0 STRL XLNG CF (GLOVE) ×3 IMPLANT
GLOVE EXAM NITRILE EXT CUFF MD (GLOVE) ×3 IMPLANT
GLOVE SURG SS PI 8.0 STRL IVOR (GLOVE) ×3 IMPLANT
GOWN STRL REUS W/ TWL LRG LVL3 (GOWN DISPOSABLE) ×3 IMPLANT
GOWN STRL REUS W/TWL LRG LVL3 (GOWN DISPOSABLE) ×6
NEEDLE HYPO 25X1 1.5 SAFETY (NEEDLE) ×3 IMPLANT
NS IRRIG 1000ML POUR BTL (IV SOLUTION) IMPLANT
PACK BASIN DAY SURGERY FS (CUSTOM PROCEDURE TRAY) ×3 IMPLANT
PENCIL BUTTON HOLSTER BLD 10FT (ELECTRODE) IMPLANT
SLEEVE SCD COMPRESS KNEE MED (MISCELLANEOUS) IMPLANT
SPONGE LAP 4X18 X RAY DECT (DISPOSABLE) ×3 IMPLANT
STRIP CLOSURE SKIN 1/2X4 (GAUZE/BANDAGES/DRESSINGS) IMPLANT
SUT MON AB 4-0 PC3 18 (SUTURE) ×3 IMPLANT
SUT VICRYL 3-0 CR8 SH (SUTURE) ×3 IMPLANT
SYR CONTROL 10ML LL (SYRINGE) ×3 IMPLANT
TOWEL OR 17X24 6PK STRL BLUE (TOWEL DISPOSABLE) ×3 IMPLANT
TOWEL OR NON WOVEN STRL DISP B (DISPOSABLE) IMPLANT

## 2016-12-24 NOTE — Op Note (Signed)
Preop diagnosis: Indwelling port a catheter for chemotherapy  Postop diagnosis: Same  Procedure: Removal of port a catheter  Surgeon: Erroll Luna M.D.  Anesthesia: MAC with local  EBL: Minimal  Specimen none  Drains: None  Indications for procedure: The patient presents for removal of port a catheter after completing chemotherapy. The patient no longer requires central venous access. Risks of bleeding, infection, catheter fragmentation, embolization, arrhythmias and damage to arteries, veins and nerves and possibly other mediastinal structures discussed. The patient agrees to proceed.  Description of procedure: The patient was seen in the holding area. Questions were answered. The patient agreed to proceed. The patient was taken to the operating room. The patient was placed supine. Anesthesia was initiated. The skin on the upper chest on the right  was prepped and draped in a sterile fashion. Timeout was done. The patient received preoperative antibiotics. Incision was made through the old port site and the hub of the Port-A-Cath was seen. The sutures were cut to release the port from the chest wall. The catheter was removed in its entirety without difficulty. The tract was closed with 3-0 Vicryl. 4 Monocryl was used to close the skin. All final counts were correct. The patient was taken to recovery in satisfactory condition.

## 2016-12-24 NOTE — Discharge Instructions (Signed)
GENERAL SURGERY: POST OP INSTRUCTIONS ° °###################################################################### ° °EAT °Gradually transition to a high fiber diet with a fiber supplement over the next few weeks after discharge.  Start with a pureed / full liquid diet (see below) ° °WALK °Walk an hour a day.  Control your pain to do that.   ° °CONTROL PAIN °Control pain so that you can walk, sleep, tolerate sneezing/coughing, go up/down stairs. ° °HAVE A BOWEL MOVEMENT DAILY °Keep your bowels regular to avoid problems.  OK to try a laxative to override constipation.  OK to use an antidairrheal to slow down diarrhea.  Call if not better after 2 tries ° °CALL IF YOU HAVE PROBLEMS/CONCERNS °Call if you are still struggling despite following these instructions. °Call if you have concerns not answered by these instructions ° °###################################################################### ° ° ° °1. DIET: Follow a light bland diet the first 24 hours after arrival home, such as soup, liquids, crackers, etc.  Be sure to include lots of fluids daily.  Avoid fast food or heavy meals as your are more likely to get nauseated.   °2. Take your usually prescribed home medications unless otherwise directed. °3. PAIN CONTROL: °a. Pain is best controlled by a usual combination of three different methods TOGETHER: °i. Ice/Heat °ii. Over the counter pain medication °iii. Prescription pain medication °b. Most patients will experience some swelling and bruising around the incisions.  Ice packs or heating pads (30-60 minutes up to 6 times a day) will help. Use ice for the first few days to help decrease swelling and bruising, then switch to heat to help relax tight/sore spots and speed recovery.  Some people prefer to use ice alone, heat alone, alternating between ice & heat.  Experiment to what works for you.  Swelling and bruising can take several weeks to resolve.   °c. It is helpful to take an over-the-counter pain medication  regularly for the first few weeks.  Choose one of the following that works best for you: °i. Naproxen (Aleve, etc)  Two 220mg tabs twice a day °ii. Ibuprofen (Advil, etc) Three 200mg tabs four times a day (every meal & bedtime) °iii. Acetaminophen (Tylenol, etc) 500-650mg four times a day (every meal & bedtime) °d. A  prescription for pain medication (such as oxycodone, hydrocodone, etc) should be given to you upon discharge.  Take your pain medication as prescribed.  °i. If you are having problems/concerns with the prescription medicine (does not control pain, nausea, vomiting, rash, itching, etc), please call us [phone] to see if we need to switch you to a different pain medicine that will work better for you and/or control your side effect better. °ii. If you need a refill on your pain medication, please contact your pharmacy.  They will contact our office to request authorization. Prescriptions will not be filled after 5 pm or on week-ends. °4. Avoid getting constipated.  Between the surgery and the pain medications, it is common to experience some constipation.  Increasing fluid intake and taking a fiber supplement (such as Metamucil, Citrucel, FiberCon, MiraLax, etc) 1-2 times a day regularly will usually help prevent this problem from occurring.  A mild laxative (prune juice, Milk of Magnesia, MiraLax, etc) should be taken according to package directions if there are no bowel movements after 48 hours.   °5. Wash / shower every day.  You may shower over the dressings as they are waterproof.  Continue to shower over incision(s) after the dressing is off. °6. Remove your waterproof bandages   5 days after surgery.  You may leave the incision open to air.  You may have skin tapes (Steri Strips) covering the incision(s).  Leave them on until one week, then remove.  You may replace a dressing/Band-Aid to cover the incision for comfort if you wish.  ° ° ° ° °7. ACTIVITIES as tolerated:   °a. You may resume  regular (light) daily activities beginning the next day--such as daily self-care, walking, climbing stairs--gradually increasing activities as tolerated.  If you can walk 30 minutes without difficulty, it is safe to try more intense activity such as jogging, treadmill, bicycling, low-impact aerobics, swimming, etc. °b. Save the most intensive and strenuous activity for last such as sit-ups, heavy lifting, contact sports, etc  Refrain from any heavy lifting or straining until you are off narcotics for pain control.   °c. DO NOT PUSH THROUGH PAIN.  Let pain be your guide: If it hurts to do something, don't do it.  Pain is your body warning you to avoid that activity for another week until the pain goes down. °d. You may drive when you are no longer taking prescription pain medication, you can comfortably wear a seatbelt, and you can safely maneuver your car and apply brakes. °e. You may have sexual intercourse when it is comfortable.  °8. FOLLOW UP in our office °a. Please call [facility] at [phone] to set up an appointment to see your surgeon in the office for a follow-up appointment approximately 2-3 weeks after your surgery. °b. Make sure that you call for this appointment the day you arrive home to insure a convenient appointment time. °9. IF YOU HAVE DISABILITY OR FAMILY LEAVE FORMS, BRING THEM TO THE OFFICE FOR PROCESSING.  DO NOT GIVE THEM TO YOUR DOCTOR. ° ° °WHEN TO CALL US [phone]: °1. Poor pain control °2. Reactions / problems with new medications (rash/itching, nausea, etc)  °3. Fever over 101.5 F (38.5 C) °4. Worsening swelling or bruising °5. Continued bleeding from incision. °6. Increased pain, redness, or drainage from the incision °7. Difficulty breathing / swallowing ° ° The clinic staff is available to answer your questions during regular business hours (8:30am-5pm).  Please don’t hesitate to call and ask to speak to one of our nurses for clinical concerns.  ° If you have a medical emergency,  go to the nearest emergency room or call 911. ° A surgeon from [facility] is always on call at the hospitals ° ° °[facility] °[address], [address], [place], [place]  [address] ? °MAIN: [phone] ? TOLL FREE: [phone] ?  °FAX [phone] °[email or web address] ° ° °Post Anesthesia Home Care Instructions ° °Activity: °Get plenty of rest for the remainder of the day. A responsible individual must stay with you for 24 hours following the procedure.  °For the next 24 hours, DO NOT: °-Drive a car °-Operate machinery °-Drink alcoholic beverages °-Take any medication unless instructed by your physician °-Make any legal decisions or sign important papers. ° °Meals: °Start with liquid foods such as gelatin or soup. Progress to regular foods as tolerated. Avoid greasy, spicy, heavy foods. If nausea and/or vomiting occur, drink only clear liquids until the nausea and/or vomiting subsides. Call your physician if vomiting continues. ° °Special Instructions/Symptoms: °Your throat may feel dry or sore from the anesthesia or the breathing tube placed in your throat during surgery. If this causes discomfort, gargle with warm salt water. The discomfort should disappear within 24 hours. ° °  If you had a scopolamine patch placed behind your ear for the management of post- operative nausea and/or vomiting: ° °1. The medication in the patch is effective for 72 hours, after which it should be removed.  Wrap patch in a tissue and discard in the trash. Wash hands thoroughly with soap and water. °2. You may remove the patch earlier than 72 hours if you experience unpleasant side effects which may include dry mouth, dizziness or visual disturbances. °3. Avoid touching the patch. Wash your hands with soap and water after contact with the patch. °  ° °

## 2016-12-24 NOTE — H&P (View-Only) (Signed)
MANILLA STRIETER 12/18/2016 10:09 AM Location: [facility] Patient #: [id number] DOB: 01-05-1951 Married / Language: [name] / Race: White Female  History of Present Illness [name] A. Altie Savard MD; 12/18/2016 10:51 AM) Patient words: Patient returns for breast cancer follow-up after radiation therapy to her right breast after breast conserving surgery in 2016. She developed some bloody nipple discharge right nipple in october 2017. This is been going on off and on for a number of months became but became bloody of the last 2 months. Mammogram and ultrasound were unremarkable except showing postoperative changes from her lumpectomy. The drainage is minimal. It is intermittent. There is no breast pain or mass.   This is improved since her last visit in December 2017. I recommended MRI given her bloody nipple discharge from the site she had breast cancer in. This was delayed due to insurance denying it at that time. She is on Mammogram which showed a cluster of calcifications in the right breast adjacent to the operative site. MRI was recommended by radiology for further evaluation. The rationale for the MRI was to better visualize this given her dense breast tissue as well as previous history of bloody nipple discharge and other diagnostic tests not revealing etiology. She underwent the MRI and surgical biopsy of this area which showed fibrocystic change with microcalcifications. She's had no further problems with bloody nipple discharge or cellulitis of the right breast. She would like to have her Port-A-Cath removed and she is finished chemotherapy.                Breast cancer of lower-outer quadrant of right female breast Right lumpectomy 11/27/2014: Invasive ductal carcinoma grade 3, 3.2 cm, with high-grade DCIS, LVI present,, 1/1 intramammary lymph node positive, 1 SLN micro-met, stage IIB, T2 N1a ER 100%, PR 12%, HER-2 negative, Ki-67 51% Patient is enrolled on  PREVENT clinical trial Lipitor versus placebo S/P Adj chemo AC x 4 foll by Abraxane weekly x 12 (started 12/28/14 completed 05/14/15)        11/06/2008 Surgery Left breast lumpectomy: High-grade DCIS 2.8 cm, 18 lymph nodes negative, ER 98%, PR 81%, stage 0, patient refused antiestrogen therapy 10/17/2014 Breast US Right breast: 2.5 cm irregular solid mass at 11:00, posterior depth 10/18/2014 Initial Biopsy Right breast invasive ductal carcinoma with DCIS grade 3, lymphovascular invasion, ER 100%, PR 12%, HER-2 negative ratio 1.4, Ki-67 51% 10/18/2014 Clinical Stage Stage IIA: T2 N0 M0 10/24/2014 Breast MRI Right breast irregular enhancing mass 2.9 x 2.6 x 2.4 cm, left breast evidence of prior lumpectomy and left axillary dissection 11/27/2014 Surgery Right lumpectomy: Invasive ductal carcinoma grade 3, 3.2 cm, with high-grade DCIS, LVI present,, 1/1 intramammary lymph node positive, 1 SLN micro-met, (2/3 total) 11/27/2014 Pathologic Stage Stage IIB: T2 N1a 12/28/2014 - 05/10/2015 Chemotherapy Adjuvant dose dense Adriamycin and Cytoxan X 4 followed by Abraxane weekly 12 06/04/2015 - 07/12/2015 Radiation Therapy Adj XRT: Right breast and regional lymph nodes 50.4 Gy over 28 sessions (no boost)  08/01/2015 - Anti-estrogen oral therapy Anastrozole 1 mg daily 5-10 years 10/29/2015 Survivorship Survivorship visit completed.                                CLINICAL DATA: History of left breast cancer status post lumpectomy and radiation therapy in 2010. History of right breast cancer status post lumpectomy and radiation therapy in 2016. Patient was treated for an infected right seroma in November of 2017.  LABS: Creatinine was obtained on site at [facility] at [address] Results: Creatinine 0.7 mg/dL. EXAM: BILATERAL BREAST MRI WITH AND WITHOUT CONTRAST TECHNIQUE: Multiplanar, multisequence MR images of both breasts were obtained prior to and following the  intravenous administration of 15 ml of MultiHance. THREE-DIMENSIONAL MR IMAGE RENDERING ON INDEPENDENT WORKSTATION: Three-dimensional MR images were rendered by post-processing of the original MR data on an independent workstation. The three-dimensional MR images were interpreted, and findings are reported in the following complete MRI report for this study. Three dimensional images were evaluated at the independent DynaCad workstation COMPARISON: Previous exam(s). FINDINGS: Breast composition: b. Scattered fibroglandular tissue. Background parenchymal enhancement: Moderate. Right breast: Lumpectomy changes are seen in the upper-outer quadrant of right breast. There is diffuse asymmetric enhancement in the upper breast and surrounding the lumpectomy site. It is felt to likely be secondary to postoperative and radiation changes. Left breast: Lumpectomy changes are seen in the left breast. There is no abnormal enhancement. Lymph nodes: No abnormal appearing lymph nodes. Ancillary findings: None. IMPRESSION: Diffuse asymmetric enhancement in the upper right breast likely secondary to lumpectomy and radiation change. RECOMMENDATION: Short-term interval follow-up MRI in 6 months is recommended. BI-RADS CATEGORY 3: Probably benign. Electronically Signed: By: Lillia Mountain M.D. On: 12/02/2016 11:13                      ADDENDUM REPORT: 12/02/2016 16:42  ADDENDUM: Upon further are evaluation the patient's MRI and comparison to prior studies there is a solitary enhancing mass in the upper-outer quadrant of the right breast measuring 10 x 5 x 6 mm. This is lateral to the lumpectomy site. It is indeterminate. It may be fat necrosis but recurrent cancer could not be excluded. MR guided core biopsy is recommended.  Recommendation: MR guided core biopsy of a right breast nodule is recommended.  Assessment: 4: Suspicious.       Diagnosis Breast, right, needle  core biopsy, UOQ - FIBROCYSTIC CHANGE AND FIBROSIS WITH CALCIFICATIONS. - NO MALIGNANCY IDENTIFIED.  The patient is a 66 year old female.   Allergies Malachy Moan, [place]; 12/18/2016 10:09 AM) No Known Drug Allergies 11/02/2014  Medication History Malachy Moan, [place]; 12/18/2016 10:11 AM) Cyanocobalamin (25MCG Tablet, Oral) Active. Restasis (0.05% Emulsion, Ophthalmic) Active. Ambien (10MG Tablet, Oral) Active. Zolpidem Tartrate (10MG Tablet, Oral) Active. Vitamin D (Ergocalciferol) (50000UNIT Capsule, Oral) Active. Medications Reconciled    Vitals Malachy Moan RMA; 12/18/2016 10:11 AM) 12/18/2016 10:11 AM Weight Measurement Declined Temp.: 98.43F  Pulse: 113 (Regular)  BP: 100/60 (Sitting, Left Arm, Standard)      Physical Exam (Zackarie Chason A. Kashmere Staffa MD; 12/18/2016 10:52 AM)  General Mental Status-Alert. General Appearance-Consistent with stated age. Hydration-Well hydrated. Voice-Normal.  Eye Eyeball - Bilateral-Extraocular movements intact. Sclera/Conjunctiva - Bilateral-No scleral icterus.  Chest and Lung Exam Chest and lung exam reveals -quiet, even and easy respiratory effort with no use of accessory muscles and on auscultation, normal breath sounds, no adventitious sounds and normal vocal resonance. Inspection Chest Wall - Normal. Back - normal.  Breast Note: Right breast shows postsurgical changes. Right nipple is mildly imported which is not new. There are no masses. The breast very dense on examination. Left breast is dense as well without signs of mass lesion or discharge or other abnormality. Port on right side is intact.  Neurologic Neurologic evaluation reveals -alert and oriented x 3 with no impairment of recent or remote memory. Mental Status-Normal.  Musculoskeletal Normal Exam - Left-Upper Extremity Strength Normal and  Lower Extremity Strength Normal. Normal Exam - Right-Upper Extremity Strength Normal and  Lower Extremity Strength Normal.  Lymphatic Head & Neck  General Head & Neck Lymphatics: Bilateral - Description - Normal. Axillary  General Axillary Region: Bilateral - Description - Normal. Tenderness - Non Tender.    Assessment & Plan (Jahking Lesser A. Demetri Kerman MD; 12/18/2016 10:53 AM)  Lonell Face DISCHARGE FROM RIGHT NIPPLE (N64.52) Impression: MRI was done which showed the area of microcalcifications which were biopsied and showed no evidence of malignancy. Fibrocystic change noted. The MRI was warranted due to right breast bloody nipple discharge and is recommended 6 months ago. Radiologist concur with this recommendation. Other modalities were not helpful to circumstances and given her breast density and history of breast cancer MRI was the best test of the circumstance.  BREAST MASS, RIGHT (N63.10)  HISTORY OF RIGHT BREAST CANCER (Z85.3) Impression: Stable return in 1 year  PORT CATHETER IN PLACE (S88.648) Impression: set up for removal risk of bleeding infection injury to blood vessel port fragmentation will set up removal  Current Plans I recommended surgery to remove the catheter. I explained the technique of removal with use of local anesthesia & possible need for more aggressive sedation/anesthesia for patient comfort.  Risks such as bleeding, infection, and other risks were discussed. Post-operative dressing/incision care was discussed. I noted a good likelihood this will help address the problem. We will work to minimize complications. Questions were answered. The patient expresses understanding & wishes to proceed with surgery.  Pt Education - [facility] Free Text Education/Instructions: discussed with patient and provided information.

## 2016-12-24 NOTE — Anesthesia Postprocedure Evaluation (Signed)
Anesthesia Post Note  Patient: Natalie Valdez  Procedure(s) Performed: Procedure(s) (LRB): REMOVAL PORT-A-CATH (Right)  Patient location during evaluation: PACU Anesthesia Type: MAC Level of consciousness: awake and alert and oriented Pain management: pain level controlled Vital Signs Assessment: post-procedure vital signs reviewed and stable Respiratory status: spontaneous breathing, nonlabored ventilation and respiratory function stable Cardiovascular status: stable and blood pressure returned to baseline Postop Assessment: no signs of nausea or vomiting Anesthetic complications: no       Last Vitals:  Vitals:   12/24/16 0845 12/24/16 0916  BP:  112/67  Pulse: 77 81  Resp: 20 16  Temp:  36.4 C    Last Pain:  Vitals:   12/24/16 0916  TempSrc: Oral  PainSc:                  Onedia Vargus A.

## 2016-12-24 NOTE — Anesthesia Preprocedure Evaluation (Addendum)
Anesthesia Evaluation  Patient identified by MRN, date of birth, ID band Patient awake    Reviewed: Allergy & Precautions, NPO status , Patient's Chart, lab work & pertinent test results  History of Anesthesia Complications (+) PONV and history of anesthetic complications  Airway Mallampati: II  TM Distance: >3 FB Neck ROM: Full    Dental no notable dental hx. (+) Teeth Intact   Pulmonary neg pulmonary ROS,    Pulmonary exam normal breath sounds clear to auscultation       Cardiovascular Normal cardiovascular exam+ Valvular Problems/Murmurs  Rhythm:Regular Rate:Normal  Indwelling Port a cath    Neuro/Psych  Headaches, negative psych ROS   GI/Hepatic negative GI ROS, Neg liver ROS,   Endo/Other  Hx/o Breast Ca  Renal/GU negative Renal ROS  negative genitourinary   Musculoskeletal  (+) Arthritis , Osteoarthritis,    Abdominal   Peds  Hematology negative hematology ROS (+)   Anesthesia Other Findings   Reproductive/Obstetrics                             Anesthesia Physical Anesthesia Plan  ASA: II  Anesthesia Plan: MAC   Post-op Pain Management:    Induction: Intravenous  Airway Management Planned: Natural Airway and Simple Face Mask  Additional Equipment:   Intra-op Plan:   Post-operative Plan:   Informed Consent: I have reviewed the patients History and Physical, chart, labs and discussed the procedure including the risks, benefits and alternatives for the proposed anesthesia with the patient or authorized representative who has indicated his/her understanding and acceptance.   Dental advisory given  Plan Discussed with: CRNA, Anesthesiologist and Surgeon  Anesthesia Plan Comments:        Anesthesia Quick Evaluation

## 2016-12-24 NOTE — Interval H&P Note (Signed)
History and Physical Interval Note:  12/24/2016 7:10 AM  Natalie Valdez  has presented today for surgery, with the diagnosis of History of breast cancer  The various methods of treatment have been discussed with the patient and family. After consideration of risks, benefits and other options for treatment, the patient has consented to  Procedure(s): REMOVAL PORT-A-CATH (N/A) as a surgical intervention .  The patient's history has been reviewed, patient examined, no change in status, stable for surgery.  I have reviewed the patient's chart and labs.  Questions were answered to the patient's satisfaction.     Talor Cheema A.

## 2016-12-24 NOTE — Transfer of Care (Signed)
Immediate Anesthesia Transfer of Care Note  Patient: Natalie Valdez  Procedure(s) Performed: Procedure(s): REMOVAL PORT-A-CATH (Right)  Patient Location: PACU  Anesthesia Type:MAC  Level of Consciousness: awake, alert  and oriented  Airway & Oxygen Therapy: Patient Spontanous Breathing and Patient connected to face mask oxygen  Post-op Assessment: Report given to RN and Post -op Vital signs reviewed and stable  Post vital signs: Reviewed and stable  Last Vitals:  Vitals:   12/24/16 0708  BP: 115/68  Pulse: 94  Resp: 18  Temp: 36.7 C    Last Pain:  Vitals:   12/24/16 0708  TempSrc: Oral      Patients Stated Pain Goal: 0 (80/32/12 2482)  Complications: No apparent anesthesia complications

## 2017-01-04 ENCOUNTER — Other Ambulatory Visit: Payer: Self-pay

## 2017-01-04 DIAGNOSIS — Z170 Estrogen receptor positive status [ER+]: Principal | ICD-10-CM

## 2017-01-04 DIAGNOSIS — C50511 Malignant neoplasm of lower-outer quadrant of right female breast: Secondary | ICD-10-CM

## 2017-01-04 NOTE — Assessment & Plan Note (Signed)
Right lumpectomy 11/27/2014: Invasive ductal carcinoma grade 3, 3.2 cm, with high-grade DCIS, LVI present,, 1/1 intramammary lymph node positive, 1 SLN micro-met, stage IIB, T2 N1a ER 100%, PR 12%, HER-2 negative, Ki-67 51% Patient is enrolled on PREVENT clinical trial Lipitor versus placebo S/P Adj chemo AC x 4 foll by Abraxane weekly x 12 (started 12/28/14 completed 05/14/15)  Current treatment: Anastrozole 1 mg daily 10 years Anastrozole toxicities:  1. Dry mouth Denies any hot flashes or myalgias  PREVENT Trial: No toxicities from the study drug.  Patient is very keen on getting a mammogram or ultrasound every 6 months.  Right breast bloody nipple discharge: Mammogram and ultrasound 05/04/2016 revealed a 2.4 cm oval fluid collection representing a seroma and is benign. There was duct ectasia in the right breast as well. This was feltalso to be benign and a 5 month   Patient's mother was diagnosed with metastatic lung cancer recently and she does not think she has a lot of time. She takes care of for 2 grandchildren ages 23 and 109-year-old son works full-time and stays very busy. She does not have time for exercise.  RTC in 6 months for follow-up.

## 2017-01-05 ENCOUNTER — Ambulatory Visit (HOSPITAL_BASED_OUTPATIENT_CLINIC_OR_DEPARTMENT_OTHER): Payer: BLUE CROSS/BLUE SHIELD | Admitting: Hematology and Oncology

## 2017-01-05 ENCOUNTER — Other Ambulatory Visit: Payer: BLUE CROSS/BLUE SHIELD

## 2017-01-05 ENCOUNTER — Other Ambulatory Visit (HOSPITAL_BASED_OUTPATIENT_CLINIC_OR_DEPARTMENT_OTHER): Payer: BLUE CROSS/BLUE SHIELD

## 2017-01-05 ENCOUNTER — Encounter: Payer: Self-pay | Admitting: *Deleted

## 2017-01-05 ENCOUNTER — Encounter: Payer: Self-pay | Admitting: Hematology and Oncology

## 2017-01-05 DIAGNOSIS — Z170 Estrogen receptor positive status [ER+]: Principal | ICD-10-CM

## 2017-01-05 DIAGNOSIS — C50511 Malignant neoplasm of lower-outer quadrant of right female breast: Secondary | ICD-10-CM

## 2017-01-05 LAB — COMPREHENSIVE METABOLIC PANEL
ALT: 13 U/L (ref 0–55)
ANION GAP: 9 meq/L (ref 3–11)
AST: 15 U/L (ref 5–34)
Albumin: 3.7 g/dL (ref 3.5–5.0)
Alkaline Phosphatase: 119 U/L (ref 40–150)
BUN: 13.6 mg/dL (ref 7.0–26.0)
CALCIUM: 9.6 mg/dL (ref 8.4–10.4)
CHLORIDE: 105 meq/L (ref 98–109)
CO2: 27 mEq/L (ref 22–29)
Creatinine: 0.8 mg/dL (ref 0.6–1.1)
EGFR: 75 mL/min/{1.73_m2} — ABNORMAL LOW (ref >90)
Glucose: 124 mg/dl (ref 70–140)
POTASSIUM: 4 meq/L (ref 3.5–5.1)
Sodium: 140 mEq/L (ref 136–145)
Total Bilirubin: 0.53 mg/dL (ref 0.20–1.20)
Total Protein: 7.1 g/dL (ref 6.4–8.3)

## 2017-01-05 LAB — CBC WITH DIFFERENTIAL/PLATELET
BASO%: 0.3 % (ref 0.0–2.0)
BASOS ABS: 0 10*3/uL (ref 0.0–0.1)
EOS%: 2.1 % (ref 0.0–7.0)
Eosinophils Absolute: 0.1 10*3/uL (ref 0.0–0.5)
HEMATOCRIT: 40.8 % (ref 34.8–46.6)
HGB: 13.8 g/dL (ref 11.6–15.9)
LYMPH#: 0.8 10*3/uL — AB (ref 0.9–3.3)
LYMPH%: 18.8 % (ref 14.0–49.7)
MCH: 30.6 pg (ref 25.1–34.0)
MCHC: 33.9 g/dL (ref 31.5–36.0)
MCV: 90.3 fL (ref 79.5–101.0)
MONO#: 0.4 10*3/uL (ref 0.1–0.9)
MONO%: 8 % (ref 0.0–14.0)
NEUT#: 3.2 10*3/uL (ref 1.5–6.5)
NEUT%: 70.8 % (ref 38.4–76.8)
PLATELETS: 224 10*3/uL (ref 145–400)
RBC: 4.52 10*6/uL (ref 3.70–5.45)
RDW: 13.5 % (ref 11.2–14.5)
WBC: 4.5 10*3/uL (ref 3.9–10.3)

## 2017-01-05 LAB — RESEARCH LABS

## 2017-01-05 MED ORDER — ANASTROZOLE 1 MG PO TABS
ORAL_TABLET | ORAL | 3 refills | Status: DC
Start: 2017-01-05 — End: 2018-01-05

## 2017-01-05 NOTE — Progress Notes (Signed)
01/05/17 at 1:19pm - PREVENT - month 24 study notes- The pt was into the cancer center this morning for her month 24 assessments.  The pt had her cardiac MRI done on 12/01/16, and the research nurse has not received the MRI Clinical Report from [place] called and spoke to [employer] at [facility].  He informed the research nurse that the pt's cardiac MRI was "negative for alert findings".  The research nurse will await the pt's MRI clinical report and have Dr. Lindi Adie review the report.  The pt was told that Dr. Lindi Adie would contact her if the MRI clinical report once received had any abnormal findings.  The pt's research labs were drawn today, and the pt was seen and examined by Dr. Lindi Adie today.  The pt recently had her port removed.  The pt completed her month 24 booklet and questionnaires.  The research nurse met with the pt for the nurse exam.  The pt reports peripheral edema on her feet right> left.  The pt said that it is a chronic problem for her and that peripheral edema "runs in her family".  The pt's waist measurement was measured at 42.5 inches.  The pt is very active and denies any limitations in her activity level.  ECOG =0.  The research nurse reviewed the pt's medications.  The pt said that she is only taking the 4 following medications:  Restasis, Ambien, Celexa, and Arimidex.  The pt was thanked for her 2 year study participation. Brion Aliment RN, BSN, CCRP Clinical Research Nurse 01/05/2017 2:19 PM   01/05/17 - month 25 PREVENT call- The pt had her cardiac MRI done on 12/01/16.  The pt's last dose of study drug was on 11/30/16.   The pt came in on 01/05/17 for the rest of her month 24 assessments.  The pt's month 25 visit was done today while the pt was in the clinic since it had been 5 weeks since the pt's last dose of study drug.  The pt denied any problems with her study drug.  She also denied any ER visits/hospitalizations related to her study drug.  The pt was thanked for  her participation and support of this trial. Brion Aliment RN, BSN, CCRP Clinical Research Nurse 01/05/2017 2:32 PM   01/14/17 at 9:04am - The research nurse received the pt's MRI clinical report on 01/07/17.  Dr. Lindi Adie also reviewed the report.  The MRI clinical report stated "negative for alert findings".  The report was scanned into the EMR for future reference. Brion Aliment RN, BSN, CCRP Clinical Research Nurse 01/14/2017 9:10 AM

## 2017-01-05 NOTE — Progress Notes (Signed)
Patient Care Team: Jonathon Jordan, MD as PCP - General (Family Medicine) Carola Frost, RN as Registered Nurse Nicholas Lose, MD as Consulting Physician (Hematology and Oncology) Arloa Koh, MD as Consulting Physician (Radiation Oncology) Erroll Luna, MD as Consulting Physician (General Surgery) Sylvan Cheese, NP as Nurse Practitioner (Hematology and Oncology)  DIAGNOSIS:  Encounter Diagnoses  Name Primary?  . Malignant neoplasm of lower-outer quadrant of right breast of female, estrogen receptor positive ([place])   . Malignant neoplasm of lower-outer quadrant of right female breast, unspecified estrogen receptor status ([place])     SUMMARY OF ONCOLOGIC HISTORY:   Breast cancer of lower-outer quadrant of right female breast ([place])   11/06/2008 Surgery    Left breast lumpectomy: High-grade DCIS 2.8 cm, 18 lymph nodes negative, ER 98%, PR 81%, stage 0, patient refused antiestrogen therapy      10/17/2014 Breast US    Right breast: 2.5 cm irregular solid mass at 11:00, posterior depth      10/18/2014 Initial Biopsy    Right breast invasive ductal carcinoma with DCIS grade 3, lymphovascular invasion, ER 100%, PR 12%, HER-2 negative ratio 1.4, Ki-67 51%      10/18/2014 Clinical Stage    Stage IIA: T2 N0 M0      10/24/2014 Breast MRI    Right breast irregular enhancing mass 2.9 x 2.6 x 2.4 cm, left breast evidence of prior lumpectomy and left axillary dissection      11/27/2014 Surgery    Right lumpectomy: Invasive ductal carcinoma grade 3, 3.2 cm, with high-grade DCIS, LVI present,, 1/1 intramammary lymph node positive, 1 SLN micro-met, (2/3 total)      11/27/2014 Pathologic Stage    Stage IIB: T2 N1a      12/28/2014 - 05/10/2015 Chemotherapy    Adjuvant dose dense Adriamycin and Cytoxan X 4 followed by Abraxane weekly 12      06/04/2015 - 07/12/2015 Radiation Therapy    Adj XRT: Right breast and regional lymph nodes 50.4 Gy over 28 sessions (no boost)      08/01/2015 -  Anti-estrogen oral therapy    Anastrozole 1 mg daily 5-10 years      10/29/2015 Survivorship    Survivorship visit completed       CHIEF COMPLIANT: follow-up on anastrozole  INTERVAL HISTORY: Natalie Valdez is a 66 year old with above-mentioned history of right breast cancer 1 underwent surgery followed by adjuvant chemotherapy and radiation and is currently on anastrozole since January 2017. She is tolerating anastrozole fairly well.she previously had dry mouth symptoms which have resolved.  REVIEW OF SYSTEMS:   Constitutional: Denies fevers, chills or abnormal weight loss Eyes: Denies blurriness of vision Ears, nose, mouth, throat, and face: Denies mucositis or sore throat Respiratory: Denies cough, dyspnea or wheezes Cardiovascular: Denies palpitation, chest discomfort Gastrointestinal:  Denies nausea, heartburn or change in bowel habits Skin: Denies abnormal skin rashes Lymphatics: Denies new lymphadenopathy or easy bruising Neurological:Denies numbness, tingling or new weaknesses Behavioral/Psych: Mood is stable, no new changes  Extremities: No lower extremity edema Breast:  denies any pain or lumps or nodules in either breasts All other systems were reviewed with the patient and are negative.  I have reviewed the past medical history, past surgical history, social history and family history with the patient and they are unchanged from previous note.  ALLERGIES:  has No Known Allergies.  MEDICATIONS:  Current Outpatient Prescriptions  Medication Sig Dispense Refill  . anastrozole (ARIMIDEX) 1 MG tablet TAKE 1 TABLET(1 MG) BY  MOUTH DAILY 90 tablet 3  . citalopram (CELEXA) 20 MG tablet Take 20 mg by mouth every evening.   11  . ibuprofen (ADVIL,MOTRIN) 800 MG tablet Take 1 tablet (800 mg total) by mouth every 8 (eight) hours as needed. 30 tablet 0  . RESTASIS 0.05 % ophthalmic emulsion Place into both eyes 2 (two) times daily.   9  . zolpidem (AMBIEN) 10 MG  tablet   5   No current facility-administered medications for this visit.     PHYSICAL EXAMINATION: ECOG PERFORMANCE STATUS: 1 - Symptomatic but completely ambulatory  Vitals:   01/05/17 1008  BP: 122/71  Pulse: 86  Resp: 18  Temp: 98.1 F (36.7 C)   Filed Weights   01/05/17 1008  Weight: 179 lb 1.6 oz (81.2 kg)    GENERAL:alert, no distress and comfortable SKIN: skin color, texture, turgor are normal, no rashes or significant lesions EYES: normal, Conjunctiva are pink and non-injected, sclera clear OROPHARYNX:no exudate, no erythema and lips, buccal mucosa, and tongue normal  NECK: supple, thyroid normal size, non-tender, without nodularity LYMPH:  no palpable lymphadenopathy in the cervical, axillary or inguinal LUNGS: clear to auscultation and percussion with normal breathing effort HEART: regular rate & rhythm and no murmurs and no lower extremity edema ABDOMEN:abdomen soft, non-tender and normal bowel sounds MUSCULOSKELETAL:no cyanosis of digits and no clubbing  NEURO: alert & oriented x 3 with fluent speech, no focal motor/sensory deficits EXTREMITIES: No lower extremity edema  LABORATORY DATA:  I have reviewed the data as listed   Chemistry      Component Value Date/Time   NA 140 01/05/2017 0948   K 4.0 01/05/2017 0948   CL 103 11/26/2014 1508   CO2 27 01/05/2017 0948   BUN 13.6 01/05/2017 0948   CREATININE 0.8 01/05/2017 0948      Component Value Date/Time   CALCIUM 9.6 01/05/2017 0948   ALKPHOS 119 01/05/2017 0948   AST 15 01/05/2017 0948   ALT 13 01/05/2017 0948   BILITOT 0.53 01/05/2017 0948       Lab Results  Component Value Date   WBC 4.5 01/05/2017   HGB 13.8 01/05/2017   HCT 40.8 01/05/2017   MCV 90.3 01/05/2017   PLT 224 01/05/2017   NEUTROABS 3.2 01/05/2017    ASSESSMENT & PLAN:  Breast cancer of lower-outer quadrant of right female breast Right lumpectomy 11/27/2014: Invasive ductal carcinoma grade 3, 3.2 cm, with high-grade DCIS,  LVI present,, 1/1 intramammary lymph node positive, 1 SLN micro-met, stage IIB, T2 N1a ER 100%, PR 12%, HER-2 negative, Ki-67 51% Patient is enrolled on PREVENT clinical trial Lipitor versus placebo S/P Adj chemo AC x 4 foll by Abraxane weekly x 12 (started 12/28/14 completed 05/14/15)  Current treatment: Anastrozole 1 mg daily 10 years Anastrozole toxicities:  Denies any hot flashes or myalgias  PREVENT Trial: No toxicities from the study drug.she completed 2 years of the trial.  Patient is very keen on getting a mammogram or ultrasound every 6 months.  Right breast bloody nipple discharge: Mammogram and ultrasound 05/04/2016 revealed a 2.4 cm oval fluid collection representing a seroma and is benign. There was duct ectasia in the right breast as well. This was feltalso to be benign and a 5 month   She takes care of for 2 grandchildren ages 79 and 68-year-old son works full-time and stays very busy. She does not have time for exercise.  RTC in 1 year for follow-up.   I spent 15 minutes talking to  the patient of which more than half was spent in counseling and coordination of care.  No orders of the defined types were placed in this encounter.  The patient has a good understanding of the overall plan. she agrees with it. she will call with any problems that may develop before the next visit here.   Rulon Eisenmenger, MD 01/05/17

## 2017-08-12 DIAGNOSIS — Z853 Personal history of malignant neoplasm of breast: Secondary | ICD-10-CM | POA: Diagnosis not present

## 2017-08-12 DIAGNOSIS — R922 Inconclusive mammogram: Secondary | ICD-10-CM | POA: Diagnosis not present

## 2017-09-14 DIAGNOSIS — E559 Vitamin D deficiency, unspecified: Secondary | ICD-10-CM | POA: Diagnosis not present

## 2017-09-14 DIAGNOSIS — G250 Essential tremor: Secondary | ICD-10-CM | POA: Diagnosis not present

## 2017-09-14 DIAGNOSIS — Z853 Personal history of malignant neoplasm of breast: Secondary | ICD-10-CM | POA: Diagnosis not present

## 2017-09-14 DIAGNOSIS — M1990 Unspecified osteoarthritis, unspecified site: Secondary | ICD-10-CM | POA: Diagnosis not present

## 2017-09-14 DIAGNOSIS — Z79899 Other long term (current) drug therapy: Secondary | ICD-10-CM | POA: Diagnosis not present

## 2017-09-14 DIAGNOSIS — R011 Cardiac murmur, unspecified: Secondary | ICD-10-CM | POA: Diagnosis not present

## 2017-09-14 DIAGNOSIS — Z23 Encounter for immunization: Secondary | ICD-10-CM | POA: Diagnosis not present

## 2017-09-14 DIAGNOSIS — G479 Sleep disorder, unspecified: Secondary | ICD-10-CM | POA: Diagnosis not present

## 2017-09-14 DIAGNOSIS — F325 Major depressive disorder, single episode, in full remission: Secondary | ICD-10-CM | POA: Diagnosis not present

## 2017-09-14 DIAGNOSIS — Z0000 Encounter for general adult medical examination without abnormal findings: Secondary | ICD-10-CM | POA: Diagnosis not present

## 2017-09-14 DIAGNOSIS — E7800 Pure hypercholesterolemia, unspecified: Secondary | ICD-10-CM | POA: Diagnosis not present

## 2017-09-14 DIAGNOSIS — I890 Lymphedema, not elsewhere classified: Secondary | ICD-10-CM | POA: Diagnosis not present

## 2017-10-15 ENCOUNTER — Other Ambulatory Visit: Payer: Self-pay | Admitting: Hematology and Oncology

## 2017-10-15 DIAGNOSIS — C50511 Malignant neoplasm of lower-outer quadrant of right female breast: Secondary | ICD-10-CM

## 2017-12-07 DIAGNOSIS — L989 Disorder of the skin and subcutaneous tissue, unspecified: Secondary | ICD-10-CM | POA: Diagnosis not present

## 2017-12-07 DIAGNOSIS — F419 Anxiety disorder, unspecified: Secondary | ICD-10-CM | POA: Diagnosis not present

## 2017-12-30 ENCOUNTER — Ambulatory Visit (HOSPITAL_COMMUNITY): Payer: BLUE CROSS/BLUE SHIELD | Admitting: Licensed Clinical Social Worker

## 2017-12-30 ENCOUNTER — Encounter

## 2018-01-05 ENCOUNTER — Inpatient Hospital Stay: Payer: PPO | Attending: Hematology and Oncology | Admitting: Hematology and Oncology

## 2018-01-05 ENCOUNTER — Other Ambulatory Visit: Payer: Self-pay | Admitting: Dermatology

## 2018-01-05 ENCOUNTER — Telehealth: Payer: Self-pay | Admitting: Hematology and Oncology

## 2018-01-05 DIAGNOSIS — Z79811 Long term (current) use of aromatase inhibitors: Secondary | ICD-10-CM | POA: Insufficient documentation

## 2018-01-05 DIAGNOSIS — D229 Melanocytic nevi, unspecified: Secondary | ICD-10-CM | POA: Diagnosis not present

## 2018-01-05 DIAGNOSIS — C50511 Malignant neoplasm of lower-outer quadrant of right female breast: Secondary | ICD-10-CM | POA: Insufficient documentation

## 2018-01-05 DIAGNOSIS — Z9221 Personal history of antineoplastic chemotherapy: Secondary | ICD-10-CM | POA: Diagnosis not present

## 2018-01-05 DIAGNOSIS — C44319 Basal cell carcinoma of skin of other parts of face: Secondary | ICD-10-CM | POA: Diagnosis not present

## 2018-01-05 DIAGNOSIS — Z923 Personal history of irradiation: Secondary | ICD-10-CM | POA: Insufficient documentation

## 2018-01-05 DIAGNOSIS — Z170 Estrogen receptor positive status [ER+]: Secondary | ICD-10-CM | POA: Diagnosis not present

## 2018-01-05 DIAGNOSIS — C44311 Basal cell carcinoma of skin of nose: Secondary | ICD-10-CM | POA: Diagnosis not present

## 2018-01-05 MED ORDER — ANASTROZOLE 1 MG PO TABS
ORAL_TABLET | ORAL | 3 refills | Status: DC
Start: 2018-01-05 — End: 2019-01-10

## 2018-01-05 NOTE — Telephone Encounter (Signed)
Gave avs and calendar ° °

## 2018-01-05 NOTE — Progress Notes (Signed)
Patient Care Team: Jonathon Jordan, MD as PCP - General (Family Medicine) Carola Frost, RN as Registered Nurse Nicholas Lose, MD as Consulting Physician (Hematology and Oncology) Arloa Koh, MD as Consulting Physician (Radiation Oncology) Erroll Luna, MD as Consulting Physician (General Surgery) Sylvan Cheese, NP as Nurse Practitioner (Hematology and Oncology)  DIAGNOSIS:  Encounter Diagnoses  Name Primary?  . Malignant neoplasm of lower-outer quadrant of right female breast, unspecified estrogen receptor status ([place])   . Malignant neoplasm of lower-outer quadrant of right breast of female, estrogen receptor positive ([employer])     SUMMARY OF ONCOLOGIC HISTORY:   Breast cancer of lower-outer quadrant of right female breast ([place])   11/06/2008 Surgery    Left breast lumpectomy: High-grade DCIS 2.8 cm, 18 lymph nodes negative, ER 98%, PR 81%, stage 0, patient refused antiestrogen therapy      10/17/2014 Breast US    Right breast: 2.5 cm irregular solid mass at 11:00, posterior depth      10/18/2014 Initial Biopsy    Right breast invasive ductal carcinoma with DCIS grade 3, lymphovascular invasion, ER 100%, PR 12%, HER-2 negative ratio 1.4, Ki-67 51%      10/18/2014 Clinical Stage    Stage IIA: T2 N0 M0      10/24/2014 Breast MRI    Right breast irregular enhancing mass 2.9 x 2.6 x 2.4 cm, left breast evidence of prior lumpectomy and left axillary dissection      11/27/2014 Surgery    Right lumpectomy: Invasive ductal carcinoma grade 3, 3.2 cm, with high-grade DCIS, LVI present,, 1/1 intramammary lymph node positive, 1 SLN micro-met, (2/3 total)      11/27/2014 Pathologic Stage    Stage IIB: T2 N1a      12/28/2014 - 05/10/2015 Chemotherapy    Adjuvant dose dense Adriamycin and Cytoxan X 4 followed by Abraxane weekly 12      06/04/2015 - 07/12/2015 Radiation Therapy    Adj XRT: Right breast and regional lymph nodes 50.4 Gy over 28 sessions (no boost)      08/01/2015 -  Anti-estrogen oral therapy    Anastrozole 1 mg daily 5-10 years      10/29/2015 Survivorship    Survivorship visit completed       CHIEF COMPLIANT: Follow-up on anastrozole therapy  INTERVAL HISTORY: Natalie Valdez is a 67 year old with above-mentioned history of right breast cancer treated with lumpectomy and adjuvant chemotherapy and radiation is currently on anastrozole therapy.  She appears to be tolerating it extremely well.  She denies any hot flashes or myalgias.  She stays very busy taking care of her adopted children ages 35 and 58.  REVIEW OF SYSTEMS:   Constitutional: Denies fevers, chills or abnormal weight loss Eyes: Denies blurriness of vision Ears, nose, mouth, throat, and face: Denies mucositis or sore throat Respiratory: Denies cough, dyspnea or wheezes Cardiovascular: Denies palpitation, chest discomfort Gastrointestinal:  Denies nausea, heartburn or change in bowel habits Skin: Denies abnormal skin rashes Lymphatics: Denies new lymphadenopathy or easy bruising Neurological:Denies numbness, tingling or new weaknesses Behavioral/Psych: Mood is stable, no new changes  Extremities: No lower extremity edema Breast:  denies any pain or lumps or nodules in either breasts All other systems were reviewed with the patient and are negative.  I have reviewed the past medical history, past surgical history, social history and family history with the patient and they are unchanged from previous note.  ALLERGIES:  has No Known Allergies.  MEDICATIONS:  Current Outpatient Medications  Medication  Sig Dispense Refill  . anastrozole (ARIMIDEX) 1 MG tablet TAKE 1 TABLET(1 MG) BY MOUTH DAILY 90 tablet 3  . citalopram (CELEXA) 20 MG tablet Take 20 mg by mouth every evening.   11  . ibuprofen (ADVIL,MOTRIN) 800 MG tablet Take 1 tablet (800 mg total) by mouth every 8 (eight) hours as needed. 30 tablet 0  . RESTASIS 0.05 % ophthalmic emulsion Place into both eyes 2 (two)  times daily.   9  . zolpidem (AMBIEN) 10 MG tablet   5   No current facility-administered medications for this visit.     PHYSICAL EXAMINATION: ECOG PERFORMANCE STATUS: 1 - Symptomatic but completely ambulatory  Vitals:   01/05/18 0907  BP: 121/76  Pulse: 84  Resp: 18  Temp: 98 F (36.7 C)  SpO2: 99%   Filed Weights   01/05/18 0907  Weight: 180 lb 3.2 oz (81.7 kg)    GENERAL:alert, no distress and comfortable SKIN: skin color, texture, turgor are normal, no rashes or significant lesions EYES: normal, Conjunctiva are pink and non-injected, sclera clear OROPHARYNX:no exudate, no erythema and lips, buccal mucosa, and tongue normal  NECK: supple, thyroid normal size, non-tender, without nodularity LYMPH:  no palpable lymphadenopathy in the cervical, axillary or inguinal LUNGS: clear to auscultation and percussion with normal breathing effort HEART: regular rate & rhythm and no murmurs and no lower extremity edema ABDOMEN:abdomen soft, non-tender and normal bowel sounds MUSCULOSKELETAL:no cyanosis of digits and no clubbing  NEURO: alert & oriented x 3 with fluent speech, no focal motor/sensory deficits EXTREMITIES: No lower extremity edema BREAST: No palpable masses or nodules in either right or left breasts. No palpable axillary supraclavicular or infraclavicular adenopathy no breast tenderness or nipple discharge. (exam performed in the presence of a chaperone)  LABORATORY DATA:  I have reviewed the data as listed CMP Latest Ref Rng & Units 01/05/2017 09/10/2015 07/09/2015  Glucose 70 - 140 mg/dl 124 136 125  BUN 7.0 - 26.0 mg/dL 13.6 14.3 14.8  Creatinine 0.6 - 1.1 mg/dL 0.8 0.8 0.7  Sodium 136 - 145 mEq/L 140 141 138  Potassium 3.5 - 5.1 mEq/L 4.0 3.7 4.2  Chloride 101 - 111 mmol/L - - -  CO2 22 - 29 mEq/L '27 25 22  ' Calcium 8.4 - 10.4 mg/dL 9.6 9.2 8.9  Total Protein 6.4 - 8.3 g/dL 7.1 6.5 6.4  Total Bilirubin 0.20 - 1.20 mg/dL 0.53 0.35 0.43  Alkaline Phos 40 - 150 U/L  119 92 93  AST 5 - 34 U/L '15 18 18  ' ALT 0 - 55 U/L '13 20 18    ' Lab Results  Component Value Date   WBC 4.5 01/05/2017   HGB 13.8 01/05/2017   HCT 40.8 01/05/2017   MCV 90.3 01/05/2017   PLT 224 01/05/2017   NEUTROABS 3.2 01/05/2017    ASSESSMENT & PLAN:  Breast cancer of lower-outer quadrant of right female breast Right lumpectomy 11/27/2014: Invasive ductal carcinoma grade 3, 3.2 cm, with high-grade DCIS, LVI present,, 1/1 intramammary lymph node positive, 1 SLN micro-met, stage IIB, T2 N1a ER 100%, PR 12%, HER-2 negative, Ki-67 51% Patient is enrolled on PREVENT clinical trial Lipitor versus placebo S/P Adj chemo AC x 4 foll by Abraxane weekly x 12 (started 12/28/14 completed 05/14/15)  Current treatment: Anastrozole 1 mg daily 10 years Anastrozole toxicities:  Denies any hot flashes or myalgias  PREVENT Trial: No toxicities from the study drug.she completed 2 years of the trial.  Breast cancer surveillance: 1. Breast exam:  01/05/18: Benign 2. Mammograms at [facility] March 2019: Benign  Return to clinic in 1 year for follow-up    No orders of the defined types were placed in this encounter.  The patient has a good understanding of the overall plan. she agrees with it. she will call with any problems that may develop before the next visit here.   Harriette Ohara, MD 01/05/18

## 2018-01-05 NOTE — Assessment & Plan Note (Signed)
Right lumpectomy 11/27/2014: Invasive ductal carcinoma grade 3, 3.2 cm, with high-grade DCIS, LVI present,, 1/1 intramammary lymph node positive, 1 SLN micro-met, stage IIB, T2 N1a ER 100%, PR 12%, HER-2 negative, Ki-67 51% Patient is enrolled on PREVENT clinical trial Lipitor versus placebo S/P Adj chemo AC x 4 foll by Abraxane weekly x 12 (started 12/28/14 completed 05/14/15)  Current treatment: Anastrozole 1 mg daily 10 years Anastrozole toxicities:  Denies any hot flashes or myalgias  PREVENT Trial: No toxicities from the study drug.she completed 2 years of the trial.  Breast cancer surveillance: 1. Breast exam: 01/05/18: Benign 2. Mammograms at [facility] March 2019: Benign  Return to clinic in 1 year for follow-up

## 2018-03-30 DIAGNOSIS — C4401 Basal cell carcinoma of skin of lip: Secondary | ICD-10-CM | POA: Diagnosis not present

## 2018-08-16 DIAGNOSIS — Z853 Personal history of malignant neoplasm of breast: Secondary | ICD-10-CM | POA: Diagnosis not present

## 2018-12-06 DIAGNOSIS — F419 Anxiety disorder, unspecified: Secondary | ICD-10-CM | POA: Diagnosis not present

## 2018-12-06 DIAGNOSIS — G479 Sleep disorder, unspecified: Secondary | ICD-10-CM | POA: Diagnosis not present

## 2019-01-02 ENCOUNTER — Telehealth: Payer: Self-pay | Admitting: Hematology and Oncology

## 2019-01-02 NOTE — Assessment & Plan Note (Signed)
Right lumpectomy 11/27/2014: Invasive ductal carcinoma grade 3, 3.2 cm, with high-grade DCIS, LVI present,, 1/1 intramammary lymph node positive, 1 SLN micro-met, stage IIB, T2 N1a ER 100%, PR 12%, HER-2 negative, Ki-67 51% Patient is enrolled on PREVENT clinical trial Lipitor versus placebo S/P Adj chemo AC x 4 foll by Abraxane weekly x 12 (started 12/28/14 completed 05/14/15)  Current treatment: Anastrozole 1 mg daily 10 years Anastrozole toxicities:  Denies any hot flashes or myalgias  PREVENT Trial: No toxicities from the study drug.she completed the trial.  Breast cancer surveillance: 1. Breast exam: 01/05/18: Benign 2. Mammograms at [facility] March 2019: Benign  Return to clinic in 1 year for follow-up

## 2019-01-02 NOTE — Telephone Encounter (Signed)
Talk with patient regarding visit °

## 2019-01-09 NOTE — Progress Notes (Signed)
HEMATOLOGY-ONCOLOGY TELEPHONE VISIT PROGRESS NOTE  I connected with Natalie Valdez on 01/10/2019 at  9:00 AM EDT by telephone and verified that I am speaking with the correct person using two identifiers.  I discussed the limitations, risks, security and privacy concerns of performing an evaluation and management service by telephone and the availability of in person appointments.  I also discussed with the patient that there may be a patient responsible charge related to this service. The patient expressed understanding and agreed to proceed.   History of Present Illness: Natalie Valdez is a 68 y.o. female with above-mentioned history of right breast cancer treated with lumpectomy, adjuvant chemotherapy, and radiation who is currently on anastrozole therapy. I last saw her a year ago. She presents over the phone today for annual follow-up.  Taking care of her dad. Works as a Environmental health practitioner at [employer].  Oncology History  Breast cancer of lower-outer quadrant of right female breast ([place])  11/06/2008 Surgery   Left breast lumpectomy: High-grade DCIS 2.8 cm, 18 lymph nodes negative, ER 98%, PR 81%, stage 0, patient refused antiestrogen therapy   10/17/2014 Breast US   Right breast: 2.5 cm irregular solid mass at 11:00, posterior depth   10/18/2014 Initial Biopsy   Right breast invasive ductal carcinoma with DCIS grade 3, lymphovascular invasion, ER 100%, PR 12%, HER-2 negative ratio 1.4, Ki-67 51%   10/18/2014 Clinical Stage   Stage IIA: T2 N0 M0   10/24/2014 Breast MRI   Right breast irregular enhancing mass 2.9 x 2.6 x 2.4 cm, left breast evidence of prior lumpectomy and left axillary dissection   11/27/2014 Surgery   Right lumpectomy: Invasive ductal carcinoma grade 3, 3.2 cm, with high-grade DCIS, LVI present,, 1/1 intramammary lymph node positive, 1 SLN micro-met, (2/3 total)   11/27/2014 Pathologic Stage   Stage IIB: T2 N1a   12/28/2014 - 05/10/2015 Chemotherapy   Adjuvant dose dense  Adriamycin and Cytoxan X 4 followed by Abraxane weekly 12   06/04/2015 - 07/12/2015 Radiation Therapy   Adj XRT: Right breast and regional lymph nodes 50.4 Gy over 28 sessions (no boost)   08/01/2015 -  Anti-estrogen oral therapy   Anastrozole 1 mg daily 5-10 years   10/29/2015 Survivorship   Survivorship visit completed     Observations/Objective:  No evidence of disease recurrence   Assessment Plan:  Breast cancer of lower-outer quadrant of right female breast Right lumpectomy 11/27/2014: Invasive ductal carcinoma grade 3, 3.2 cm, with high-grade DCIS, LVI present,, 1/1 intramammary lymph node positive, 1 SLN micro-met, stage IIB, T2 N1a ER 100%, PR 12%, HER-2 negative, Ki-67 51% Patient is enrolled on PREVENT clinical trial Lipitor versus placebo S/P Adj chemo AC x 4 foll by Abraxane weekly x 12 (started 12/28/14 completed 05/14/15)  Current treatment: Anastrozole 1 mg daily 10 years Anastrozole toxicities:  Denies any hot flashes or myalgias  PREVENT Trial: No toxicities from the study drug.she completed the trial.  Breast cancer surveillance: 1. Breast exam: 01/05/18: Benign 2. Mammograms at [facility] March 2020: Benign  Return to clinic in 1 year for follow-up  I discussed the assessment and treatment plan with the patient. The patient was provided an opportunity to ask questions and all were answered. The patient agreed with the plan and demonstrated an understanding of the instructions. The patient was advised to call back or seek an in-person evaluation if the symptoms worsen or if the condition fails to improve as anticipated.   I provided 11 minutes of non-face-to-face time  during this encounter.   Rulon Eisenmenger, MD 01/10/2019    I, Molly Dorshimer, am acting as scribe for Nicholas Lose, MD.  I have reviewed the above documentation for accuracy and completeness, and I agree with the above.

## 2019-01-10 ENCOUNTER — Inpatient Hospital Stay: Payer: PPO | Attending: Hematology and Oncology | Admitting: Hematology and Oncology

## 2019-01-10 DIAGNOSIS — Z9221 Personal history of antineoplastic chemotherapy: Secondary | ICD-10-CM

## 2019-01-10 DIAGNOSIS — Z923 Personal history of irradiation: Secondary | ICD-10-CM

## 2019-01-10 DIAGNOSIS — Z170 Estrogen receptor positive status [ER+]: Secondary | ICD-10-CM | POA: Diagnosis not present

## 2019-01-10 DIAGNOSIS — Z79811 Long term (current) use of aromatase inhibitors: Secondary | ICD-10-CM | POA: Diagnosis not present

## 2019-01-10 DIAGNOSIS — C50511 Malignant neoplasm of lower-outer quadrant of right female breast: Secondary | ICD-10-CM | POA: Diagnosis not present

## 2019-01-10 MED ORDER — ANASTROZOLE 1 MG PO TABS
ORAL_TABLET | ORAL | 3 refills | Status: DC
Start: 2019-01-10 — End: 2019-12-27

## 2019-03-22 DIAGNOSIS — I890 Lymphedema, not elsewhere classified: Secondary | ICD-10-CM | POA: Diagnosis not present

## 2019-03-22 DIAGNOSIS — Z0000 Encounter for general adult medical examination without abnormal findings: Secondary | ICD-10-CM | POA: Diagnosis not present

## 2019-03-22 DIAGNOSIS — F325 Major depressive disorder, single episode, in full remission: Secondary | ICD-10-CM | POA: Diagnosis not present

## 2019-03-22 DIAGNOSIS — C50919 Malignant neoplasm of unspecified site of unspecified female breast: Secondary | ICD-10-CM | POA: Diagnosis not present

## 2019-03-22 DIAGNOSIS — R011 Cardiac murmur, unspecified: Secondary | ICD-10-CM | POA: Diagnosis not present

## 2019-03-22 DIAGNOSIS — G250 Essential tremor: Secondary | ICD-10-CM | POA: Diagnosis not present

## 2019-03-22 DIAGNOSIS — Z23 Encounter for immunization: Secondary | ICD-10-CM | POA: Diagnosis not present

## 2019-03-22 DIAGNOSIS — E559 Vitamin D deficiency, unspecified: Secondary | ICD-10-CM | POA: Diagnosis not present

## 2019-03-22 DIAGNOSIS — M1990 Unspecified osteoarthritis, unspecified site: Secondary | ICD-10-CM | POA: Diagnosis not present

## 2019-03-22 DIAGNOSIS — Z1211 Encounter for screening for malignant neoplasm of colon: Secondary | ICD-10-CM | POA: Diagnosis not present

## 2019-03-22 DIAGNOSIS — Z79899 Other long term (current) drug therapy: Secondary | ICD-10-CM | POA: Diagnosis not present

## 2019-03-22 DIAGNOSIS — E7800 Pure hypercholesterolemia, unspecified: Secondary | ICD-10-CM | POA: Diagnosis not present

## 2019-08-22 ENCOUNTER — Encounter: Payer: Self-pay | Admitting: Hematology and Oncology

## 2019-08-22 DIAGNOSIS — R928 Other abnormal and inconclusive findings on diagnostic imaging of breast: Secondary | ICD-10-CM | POA: Diagnosis not present

## 2019-11-20 DIAGNOSIS — L821 Other seborrheic keratosis: Secondary | ICD-10-CM | POA: Diagnosis not present

## 2019-11-20 DIAGNOSIS — K219 Gastro-esophageal reflux disease without esophagitis: Secondary | ICD-10-CM | POA: Diagnosis not present

## 2019-11-20 DIAGNOSIS — G479 Sleep disorder, unspecified: Secondary | ICD-10-CM | POA: Diagnosis not present

## 2019-11-20 DIAGNOSIS — N3281 Overactive bladder: Secondary | ICD-10-CM | POA: Diagnosis not present

## 2019-11-20 DIAGNOSIS — M170 Bilateral primary osteoarthritis of knee: Secondary | ICD-10-CM | POA: Diagnosis not present

## 2019-12-01 ENCOUNTER — Ambulatory Visit: Payer: PPO | Admitting: Orthopaedic Surgery

## 2019-12-01 ENCOUNTER — Ambulatory Visit: Payer: Self-pay

## 2019-12-01 ENCOUNTER — Other Ambulatory Visit: Payer: Self-pay

## 2019-12-01 DIAGNOSIS — M1711 Unilateral primary osteoarthritis, right knee: Secondary | ICD-10-CM

## 2019-12-01 DIAGNOSIS — M1712 Unilateral primary osteoarthritis, left knee: Secondary | ICD-10-CM | POA: Insufficient documentation

## 2019-12-01 NOTE — Progress Notes (Signed)
Office Visit Note   Patient: Natalie Valdez           Date of Birth: 01-05-1951           MRN: [id number] Visit Date: 12/01/2019              Requested by: Jonathon Jordan, MD [address] [place],  [place] [address] PCP: Jonathon Jordan, MD   Assessment & Plan: Visit Diagnoses:  1. Primary osteoarthritis of right knee   2. Primary osteoarthritis of left knee     Plan: Impression is right greater than left end-stage tricompartmental DJD.  She is having a lot of issues with mobility due to the DJD.  She has not had great success with conservative treatments.  She is interested in undergoing total knee replacement sometime in the fall.  We had a lengthy discussion on the details of the surgery including the risk benefits rehab recovery.  She will follow-up with [place] in about 6 weeks prior to when she thinks that she may want to have the near surgery.  She has a history of breast cancer and currently on an estrogen replacement we will place her on Xarelto postoperatively.  Follow-Up Instructions: Return if symptoms worsen or fail to improve.   Orders:  Orders Placed This Encounter  Procedures  . XR Knee Complete 4 Views Left  . XR Knee Complete 4 Views Right   No orders of the defined types were placed in this encounter.     Procedures: No procedures performed   Clinical Data: No additional findings.   Subjective: Chief Complaint  Patient presents with  . Left Knee - Pain  . Right Knee - Pain    Natalie Valdez is a very pleasant 69 year old female comes in for evaluation of bilateral knee pain due to DJD worse on the right side.  She has a history of rheumatoid arthritis.  Not on any anti-Biologics.  She has a lot of issues with mobility mainly due to the stiffness.  She walks with stiff knees.  She does not report any severe pain but she walks very awkwardly.  She does have stiffness and start up stiffness.  She has tried Celebrex for years and has had multiple  injections without significant relief.   Review of Systems  Constitutional: Negative.   HENT: Negative.   Eyes: Negative.   Respiratory: Negative.   Cardiovascular: Negative.   Endocrine: Negative.   Musculoskeletal: Negative.   Neurological: Negative.   Hematological: Negative.   Psychiatric/Behavioral: Negative.   All other systems reviewed and are negative.    Objective: Vital Signs: LMP 11/07/2004   Physical Exam Vitals and nursing note reviewed.  Constitutional:      Appearance: She is well-developed.  HENT:     Head: Normocephalic and atraumatic.  Pulmonary:     Effort: Pulmonary effort is normal.  Abdominal:     Palpations: Abdomen is soft.  Musculoskeletal:     Cervical back: Neck supple.  Skin:    General: Skin is warm.     Capillary Refill: Capillary refill takes less than 2 seconds.  Neurological:     Mental Status: She is alert and oriented to person, place, and time.  Psychiatric:        Behavior: Behavior normal.        Thought Content: Thought content normal.        Judgment: Judgment normal.     Ortho Exam Right knee shows a 15 degree flexion contracture.  No joint effusion.  Flexion is also moderately limited with mild discomfort.  Collaterals and cruciates are grossly intact.  Left knee shows moderate limitation in range of motion.  No flexion contracture.  No joint effusion. Specialty Comments:  No specialty comments available.  Imaging: XR Knee Complete 4 Views Left  Result Date: 12/01/2019 Advanced tricompartmental DJD.  XR Knee Complete 4 Views Right  Result Date: 12/01/2019 Advanced tricompartmental DJD with significant patellofemoral disease    PMFS History: Patient Active Problem List   Diagnosis Date Noted  . Primary osteoarthritis of right knee 12/01/2019  . Primary osteoarthritis of left knee 12/01/2019  . Port catheter in place 01/07/2016  . Heartburn 01/04/2015  . Breast cancer of lower-outer quadrant of right female  breast ([place]) 11/07/2014  . History of breast cancer 04/17/2011   Past Medical History:  Diagnosis Date  . Arthritis   . Breast cancer ([place])   . Breast cancer, left breast ([place]) 11/06/2008   breast  . Breast cancer, right breast ([place]) 10/18/2014  . Generalized headaches   . Heart murmur    dx 2005-never had echo  . PONV (postoperative nausea and vomiting)    needs scop patch  . Radiation 06/04/15-07/12/15   right breast and regional lymph nodes 5040 cGy    Family History  Problem Relation Age of Onset  . Breast cancer Mother   . Ovarian cancer Paternal Grandmother     Past Surgical History:  Procedure Laterality Date  . ABDOMINAL HYSTERECTOMY  2004   BSO  . BREAST ENHANCEMENT SURGERY  2010  . BREAST LUMPECTOMY  2010   lumpectomy-lt-snbx  . BREAST LUMPECTOMY  2010   lt re-excision br  . BREAST LUMPECTOMY WITH RADIOACTIVE SEED AND SENTINEL LYMPH NODE BIOPSY Right 11/27/2014   Procedure: BREAST LUMPECTOMY WITH RADIOACTIVE SEED AND SENTINEL LYMPH NODE MAPPING;  Surgeon: Erroll Luna, MD;  Location: [place] [facility];  Service: General;  Laterality: Right;  . CESAREAN SECTION    . DILATION AND CURETTAGE OF UTERUS  1999  . PORT-A-CATH REMOVAL Right 12/24/2016   Procedure: REMOVAL PORT-A-CATH;  Surgeon: Erroll Luna, MD;  Location: [facility];  Service: General;  Laterality: Right;  . PORTACATH PLACEMENT Right 12/18/2014   Procedure: INSERTION PORT-A-CATH WITH [place] AND FLOURO;  Surgeon: Erroll Luna, MD;  Location: [facility];  Service: General;  Laterality: Right;   Social History   Occupational History  . Not on file  Tobacco Use  . Smoking status: Never Smoker  . Smokeless tobacco: Never Used  Substance and Sexual Activity  . Alcohol use: Yes    Comment: very little - monthly  . Drug use: No  . Sexual activity: Not on file

## 2019-12-26 ENCOUNTER — Other Ambulatory Visit: Payer: Self-pay | Admitting: Hematology and Oncology

## 2019-12-26 DIAGNOSIS — C50511 Malignant neoplasm of lower-outer quadrant of right female breast: Secondary | ICD-10-CM

## 2019-12-27 ENCOUNTER — Telehealth: Payer: Self-pay | Admitting: Hematology and Oncology

## 2019-12-27 NOTE — Telephone Encounter (Signed)
R/s 6/16 appt per provider PAL - unable to reach pt . Left message with appt date and time

## 2020-01-10 ENCOUNTER — Ambulatory Visit: Payer: PPO | Admitting: Hematology and Oncology

## 2020-01-19 ENCOUNTER — Inpatient Hospital Stay: Payer: PPO | Attending: Hematology and Oncology | Admitting: Hematology and Oncology

## 2020-01-19 NOTE — Assessment & Plan Note (Deleted)
Right lumpectomy 11/27/2014: Invasive ductal carcinoma grade 3, 3.2 cm, with high-grade DCIS, LVI present,, 1/1 intramammary lymph node positive, 1 SLN micro-met, stage IIB, T2 N1a ER 100%, PR 12%, HER-2 negative, Ki-67 51% Patient is enrolled on PREVENT clinical trial Lipitor versus placebo S/P Adj chemo AC x 4 foll by Abraxane weekly x 12 (started 12/28/14 completed 05/14/15)  Current treatment: Anastrozole 1 mg daily 10 years Anastrozole toxicities:  Denies any hot flashes or myalgias  PREVENT Trial: No toxicities from the study drug.she completed the trial.  Breast cancer surveillance: 1. Breast exam: 01/19/2020: Benign 2. M[facility] at [facility] 08/16/2018: Benign  Return to clinic in 1 year for follow-up

## 2020-01-31 NOTE — Assessment & Plan Note (Deleted)
Breast cancer of lower-outer quadrant of right female breast Right lumpectomy 11/27/2014: Invasive ductal carcinoma grade 3, 3.2 cm, with high-grade DCIS, LVI present,, 1/1 intramammary lymph node positive, 1 SLN micro-met, stage IIB, T2 N1a ER 100%, PR 12%, HER-2 negative, Ki-67 51% Patient is enrolled on PREVENT clinical trial Lipitor versus placebo S/P Adj chemo AC x 4 foll by Abraxane weekly x 12 (started 12/28/14 completed 05/14/15)  Current treatment: Anastrozole 1 mg daily 10 years Anastrozole toxicities:  Denies any hot flashes or myalgias  PREVENT Trial: No toxicities from the study drug.she completed the trial.  Breast cancer surveillance: 1. Breast exam: 01/31/2020: Benign 2. Mammograms at [facility] March 2020: Benign  Return to clinic in 1 year for follow-up

## 2020-02-01 ENCOUNTER — Inpatient Hospital Stay: Payer: PPO | Admitting: Hematology and Oncology

## 2020-02-16 ENCOUNTER — Inpatient Hospital Stay: Payer: PPO | Admitting: Hematology and Oncology

## 2020-03-17 NOTE — Progress Notes (Signed)
Patient Care Team: Jonathon Jordan, MD as PCP - General (Family Medicine) Carola Frost, RN as Registered Nurse Nicholas Lose, MD as Consulting Physician (Hematology and Oncology) Arloa Koh, MD (Inactive) as Consulting Physician (Radiation Oncology) Erroll Luna, MD as Consulting Physician (General Surgery) Sylvan Cheese, NP as Nurse Practitioner (Hematology and Oncology)  DIAGNOSIS:    ICD-10-CM   1. Malignant neoplasm of lower-outer quadrant of right breast of female, estrogen receptor positive ([place])  C50.511    Z17.0     SUMMARY OF ONCOLOGIC HISTORY: Oncology History  Breast cancer of lower-outer quadrant of right female breast ([place])  11/06/2008 Surgery   Left breast lumpectomy: High-grade DCIS 2.8 cm, 18 lymph nodes negative, ER 98%, PR 81%, stage 0, patient refused antiestrogen therapy   10/17/2014 Breast US   Right breast: 2.5 cm irregular solid mass at 11:00, posterior depth   10/18/2014 Initial Biopsy   Right breast invasive ductal carcinoma with DCIS grade 3, lymphovascular invasion, ER 100%, PR 12%, HER-2 negative ratio 1.4, Ki-67 51%   10/18/2014 Clinical Stage   Stage IIA: T2 N0 M0   10/24/2014 Breast MRI   Right breast irregular enhancing mass 2.9 x 2.6 x 2.4 cm, left breast evidence of prior lumpectomy and left axillary dissection   11/27/2014 Surgery   Right lumpectomy: Invasive ductal carcinoma grade 3, 3.2 cm, with high-grade DCIS, LVI present,, 1/1 intramammary lymph node positive, 1 SLN micro-met, (2/3 total)   11/27/2014 Pathologic Stage   Stage IIB: T2 N1a   12/28/2014 - 05/10/2015 Chemotherapy   Adjuvant dose dense Adriamycin and Cytoxan X 4 followed by Abraxane weekly 12   06/04/2015 - 07/12/2015 Radiation Therapy   Adj XRT: Right breast and regional lymph nodes 50.4 Gy over 28 sessions (no boost)   08/01/2015 -  Anti-estrogen oral therapy   Anastrozole 1 mg daily 5-10 years   10/29/2015 Survivorship   Survivorship visit completed       CHIEF COMPLIANT: Follow-up of right breast cancer on anastrozole   INTERVAL HISTORY: Natalie Valdez is a 69 y.o. with above-mentioned history of right breast cancer treated with lumpectomy, adjuvant chemotherapy, and radiation who is currently on anastrozole therapy. She presents to the clinic today for annual follow-up.   She had had arthritic symptoms but otherwise doing quite well.  ALLERGIES:  has No Known Allergies.  MEDICATIONS:  Current Outpatient Medications  Medication Sig Dispense Refill  . anastrozole (ARIMIDEX) 1 MG tablet TAKE 1 TABLET BY MOUTH DAILY 90 tablet 3  . citalopram (CELEXA) 20 MG tablet Take 20 mg by mouth every evening.   11  . ibuprofen (ADVIL,MOTRIN) 800 MG tablet Take 1 tablet (800 mg total) by mouth every 8 (eight) hours as needed. 30 tablet 0  . omeprazole (PRILOSEC) 40 MG capsule Take 40 mg by mouth every morning.    . RESTASIS 0.05 % ophthalmic emulsion Place into both eyes 2 (two) times daily.   9  . zolpidem (AMBIEN) 10 MG tablet   5   No current facility-administered medications for this visit.    PHYSICAL EXAMINATION: ECOG PERFORMANCE STATUS: 1 - Symptomatic but completely ambulatory  Vitals:   03/18/20 1439  BP: 132/79  Pulse: 90  Resp: 18  Temp: 98.6 F (37 C)  SpO2: 99%   Filed Weights   03/18/20 1439  Weight: 202 lb 11.2 oz (91.9 kg)    BREAST: No palpable masses or nodules in either right or left breasts. No palpable axillary supraclavicular or infraclavicular adenopathy  no breast tenderness or nipple discharge. (exam performed in the presence of a chaperone)  LABORATORY DATA:  I have reviewed the data as listed CMP Latest Ref Rng & Units 01/05/2017 09/10/2015 07/09/2015  Glucose 70 - 140 mg/dl 124 136 125  BUN 7.0 - 26.0 mg/dL 13.6 14.3 14.8  Creatinine 0.6 - 1.1 mg/dL 0.8 0.8 0.7  Sodium 136 - 145 mEq/L 140 141 138  Potassium 3.5 - 5.1 mEq/L 4.0 3.7 4.2  Chloride 101 - 111 mmol/L - - -  CO2 22 - 29 mEq/L '27 25 22   ' Calcium 8.4 - 10.4 mg/dL 9.6 9.2 8.9  Total Protein 6.4 - 8.3 g/dL 7.1 6.5 6.4  Total Bilirubin 0.20 - 1.20 mg/dL 0.53 0.35 0.43  Alkaline Phos 40 - 150 U/L 119 92 93  AST 5 - 34 U/L '15 18 18  ' ALT 0 - 55 U/L '13 20 18    ' Lab Results  Component Value Date   WBC 4.5 01/05/2017   HGB 13.8 01/05/2017   HCT 40.8 01/05/2017   MCV 90.3 01/05/2017   PLT 224 01/05/2017   NEUTROABS 3.2 01/05/2017    ASSESSMENT & PLAN:  Breast cancer of lower-outer quadrant of right female breast Right lumpectomy 11/27/2014: Invasive ductal carcinoma grade 3, 3.2 cm, with high-grade DCIS, LVI present,, 1/1 intramammary lymph node positive, 1 SLN micro-met, stage IIB, T2 N1a ER 100%, PR 12%, HER-2 negative, Ki-67 51% Patient is enrolled on PREVENT clinical trial Lipitor versus placebo S/P Adj chemo AC x 4 foll by Abraxane weekly x 12 (started 12/28/14 completed 05/14/15)  Current treatment: Anastrozole 1 mg daily 10 years Anastrozole toxicities:  Denies any hot flashes or myalgias  PREVENT Trial: No toxicities from the study drug.she completed the trial.  Breast cancer surveillance: 1. Breast exam: 03/18/2020: Benign 2. Mammograms at [facility] March 2020: Benign  Return to clinic in 1 year for follow-up    No orders of the defined types were placed in this encounter.  The patient has a good understanding of the overall plan. she agrees with it. she will call with any problems that may develop before the next visit here.  Total time spent: 20 mins including face to face time and time spent for planning, charting and coordination of care  Nicholas Lose, MD 03/18/2020  I, Cloyde Reams Dorshimer, am acting as scribe for Dr. Nicholas Lose.  I have reviewed the above documentation for accuracy and completeness, and I agree with the above.

## 2020-03-18 ENCOUNTER — Other Ambulatory Visit: Payer: Self-pay

## 2020-03-18 ENCOUNTER — Inpatient Hospital Stay: Payer: PPO | Attending: Hematology and Oncology | Admitting: Hematology and Oncology

## 2020-03-18 DIAGNOSIS — Z791 Long term (current) use of non-steroidal anti-inflammatories (NSAID): Secondary | ICD-10-CM | POA: Insufficient documentation

## 2020-03-18 DIAGNOSIS — Z170 Estrogen receptor positive status [ER+]: Secondary | ICD-10-CM | POA: Insufficient documentation

## 2020-03-18 DIAGNOSIS — C50511 Malignant neoplasm of lower-outer quadrant of right female breast: Secondary | ICD-10-CM | POA: Insufficient documentation

## 2020-03-18 DIAGNOSIS — Z79899 Other long term (current) drug therapy: Secondary | ICD-10-CM | POA: Insufficient documentation

## 2020-03-18 DIAGNOSIS — Z79811 Long term (current) use of aromatase inhibitors: Secondary | ICD-10-CM | POA: Insufficient documentation

## 2020-03-18 NOTE — Assessment & Plan Note (Signed)
Right lumpectomy 11/27/2014: Invasive ductal carcinoma grade 3, 3.2 cm, with high-grade DCIS, LVI present,, 1/1 intramammary lymph node positive, 1 SLN micro-met, stage IIB, T2 N1a ER 100%, PR 12%, HER-2 negative, Ki-67 51% Patient is enrolled on PREVENT clinical trial Lipitor versus placebo S/P Adj chemo AC x 4 foll by Abraxane weekly x 12 (started 12/28/14 completed 05/14/15)  Current treatment: Anastrozole 1 mg daily 10 years Anastrozole toxicities:  Denies any hot flashes or myalgias  PREVENT Trial: No toxicities from the study drug.she completed the trial.  Breast cancer surveillance: 1. Breast exam: 03/18/2020: Benign 2. Mammograms at [facility] March 2020: Benign  Return to clinic in 1 year for follow-up

## 2020-03-19 ENCOUNTER — Telehealth: Payer: Self-pay | Admitting: *Deleted

## 2020-03-19 NOTE — Telephone Encounter (Signed)
Ordered BCI per Dr. Lindi Adie request.  Faxed requisition to [facility] and pathology.

## 2020-04-04 ENCOUNTER — Other Ambulatory Visit (HOSPITAL_COMMUNITY)
Admission: RE | Admit: 2020-04-04 | Discharge: 2020-04-04 | Disposition: A | Discharge status: Home / Self Care | Payer: PPO | Source: Ambulatory Visit | Attending: Family Medicine | Admitting: Family Medicine

## 2020-04-04 DIAGNOSIS — E7800 Pure hypercholesterolemia, unspecified: Secondary | ICD-10-CM | POA: Diagnosis not present

## 2020-04-04 DIAGNOSIS — Z79899 Other long term (current) drug therapy: Secondary | ICD-10-CM | POA: Diagnosis not present

## 2020-04-04 DIAGNOSIS — E559 Vitamin D deficiency, unspecified: Secondary | ICD-10-CM | POA: Diagnosis not present

## 2020-04-04 DIAGNOSIS — G479 Sleep disorder, unspecified: Secondary | ICD-10-CM | POA: Diagnosis not present

## 2020-04-04 DIAGNOSIS — F330 Major depressive disorder, recurrent, mild: Secondary | ICD-10-CM | POA: Diagnosis not present

## 2020-04-04 DIAGNOSIS — K219 Gastro-esophageal reflux disease without esophagitis: Secondary | ICD-10-CM | POA: Diagnosis not present

## 2020-04-04 DIAGNOSIS — Z1211 Encounter for screening for malignant neoplasm of colon: Secondary | ICD-10-CM | POA: Diagnosis not present

## 2020-04-04 DIAGNOSIS — Z01411 Encounter for gynecological examination (general) (routine) with abnormal findings: Secondary | ICD-10-CM | POA: Diagnosis not present

## 2020-04-04 DIAGNOSIS — Z0000 Encounter for general adult medical examination without abnormal findings: Secondary | ICD-10-CM | POA: Diagnosis not present

## 2020-04-04 DIAGNOSIS — Z23 Encounter for immunization: Secondary | ICD-10-CM | POA: Diagnosis not present

## 2020-04-04 DIAGNOSIS — N3281 Overactive bladder: Secondary | ICD-10-CM | POA: Diagnosis not present

## 2020-04-04 DIAGNOSIS — I890 Lymphedema, not elsewhere classified: Secondary | ICD-10-CM | POA: Diagnosis not present

## 2020-04-04 DIAGNOSIS — I517 Cardiomegaly: Secondary | ICD-10-CM | POA: Diagnosis not present

## 2020-04-08 LAB — CYTOLOGY - PAP: Diagnosis: NEGATIVE

## 2020-04-11 DIAGNOSIS — C50511 Malignant neoplasm of lower-outer quadrant of right female breast: Secondary | ICD-10-CM | POA: Diagnosis not present

## 2020-04-11 DIAGNOSIS — Z170 Estrogen receptor positive status [ER+]: Secondary | ICD-10-CM | POA: Diagnosis not present

## 2020-04-15 ENCOUNTER — Encounter: Payer: Self-pay | Admitting: Hematology and Oncology

## 2020-04-16 ENCOUNTER — Telehealth: Payer: Self-pay | Admitting: Hematology and Oncology

## 2020-04-16 ENCOUNTER — Encounter (HOSPITAL_COMMUNITY): Payer: Self-pay | Admitting: Hematology and Oncology

## 2020-04-16 NOTE — Telephone Encounter (Signed)
Breast cancer index results: Predicts for benefit with extended endocrine therapy So she will continue with anastrozole for another 5 years. Risk of late distant recurrence: 18.1%

## 2020-06-10 DIAGNOSIS — J069 Acute upper respiratory infection, unspecified: Secondary | ICD-10-CM | POA: Diagnosis not present

## 2020-10-03 DIAGNOSIS — M26629 Arthralgia of temporomandibular joint, unspecified side: Secondary | ICD-10-CM | POA: Diagnosis not present

## 2020-10-03 DIAGNOSIS — M65352 Trigger finger, left little finger: Secondary | ICD-10-CM | POA: Diagnosis not present

## 2020-12-01 ENCOUNTER — Other Ambulatory Visit: Payer: Self-pay | Admitting: Hematology and Oncology

## 2020-12-01 DIAGNOSIS — C50511 Malignant neoplasm of lower-outer quadrant of right female breast: Secondary | ICD-10-CM

## 2021-03-11 ENCOUNTER — Encounter: Payer: Self-pay | Admitting: Hematology and Oncology

## 2021-03-17 NOTE — Assessment & Plan Note (Signed)
Right lumpectomy 11/27/2014: Invasive ductal carcinoma grade 3, 3.2 cm, with high-grade DCIS, LVI present,, 1/1 intramammary lymph node positive, 1 SLN micro-met, stage IIB, T2 N1a ER 100%, PR 12%, HER-2 negative, Ki-67 51% Patient is enrolled on PREVENT clinical trial Lipitor versus placebo S/P Adj chemo AC x 4 foll by Abraxane weekly x 12 (started 12/28/14 completed 05/14/15)  Current treatment: Anastrozole 1 mg daily 10 years Anastrozole toxicities:  Denies any hot flashes or myalgias BCI: Benefit to extended adjuvant therapy  PREVENT Trial: No toxicities from the study drug.she completed the trial.  Breast cancer surveillance: 1. Breast exam: 03/18/2021: Benign 2. Mammograms at [facility] March 2022: Benign  Return to clinic in 1 year for follow-up

## 2021-03-17 NOTE — Progress Notes (Signed)
HEMATOLOGY-ONCOLOGY MYCHART VIDEO VISIT PROGRESS NOTE  I connected with Natalie Valdez on 03/18/2021 at  2:00 PM EDT by MyChart video conference and verified that I am speaking with the correct person using two identifiers.  I discussed the limitations, risks, security and privacy concerns of performing an evaluation and management service by MyChart and the availability of in person appointments.  I also discussed with the patient that there may be a patient responsible charge related to this service. The patient expressed understanding and agreed to proceed.  Patient's Location: Home Physician Location: Clinic  CHIEF COMPLIANT: Follow-up of right breast cancer on anastrozole   INTERVAL HISTORY: Natalie Valdez is a 70 y.o. female with above-mentioned history of right breast cancer treated with lumpectomy, adjuvant chemotherapy, and radiation who is currently on anastrozole therapy. She presents to via [facility] today for annual follow-up.  She moved to [facility] and would like to follow [place] virtually annually.  She will find a local place to do her mammograms.  Oncology History  Breast cancer of lower-outer quadrant of right female breast ([place])  11/06/2008 Surgery   Left breast lumpectomy: High-grade DCIS 2.8 cm, 18 lymph nodes negative, ER 98%, PR 81%, stage 0, patient refused antiestrogen therapy   10/17/2014 Breast US   Right breast: 2.5 cm irregular solid mass at 11:00, posterior depth   10/18/2014 Initial Biopsy   Right breast invasive ductal carcinoma with DCIS grade 3, lymphovascular invasion, ER 100%, PR 12%, HER-2 negative ratio 1.4, Ki-67 51%   10/18/2014 Clinical Stage   Stage IIA: T2 N0 M0   10/24/2014 Breast MRI   Right breast irregular enhancing mass 2.9 x 2.6 x 2.4 cm, left breast evidence of prior lumpectomy and left axillary dissection   11/27/2014 Surgery   Right lumpectomy: Invasive ductal carcinoma grade 3, 3.2 cm, with high-grade DCIS, LVI present,, 1/1 intramammary  lymph node positive, 1 SLN micro-met, (2/3 total)   11/27/2014 Pathologic Stage   Stage IIB: T2 N1a   12/28/2014 - 05/10/2015 Chemotherapy   Adjuvant dose dense Adriamycin and Cytoxan X 4 followed by Abraxane weekly 12   06/04/2015 - 07/12/2015 Radiation Therapy   Adj XRT: Right breast and regional lymph nodes 50.4 Gy over 28 sessions (no boost)      08/01/2015 -  Anti-estrogen oral therapy   Anastrozole 1 mg daily 5-10 years   10/29/2015 Survivorship   Survivorship visit completed     Observations/Objective:  There were no vitals filed for this visit. There is no height or weight on file to calculate BMI.  I have reviewed the data as listed CMP Latest Ref Rng & Units 01/05/2017 09/10/2015 07/09/2015  Glucose 70 - 140 mg/dl 124 136 125  BUN 7.0 - 26.0 mg/dL 13.6 14.3 14.8  Creatinine 0.6 - 1.1 mg/dL 0.8 0.8 0.7  Sodium 136 - 145 mEq/L 140 141 138  Potassium 3.5 - 5.1 mEq/L 4.0 3.7 4.2  Chloride 101 - 111 mmol/L - - -  CO2 22 - 29 mEq/L '27 25 22  ' Calcium 8.4 - 10.4 mg/dL 9.6 9.2 8.9  Total Protein 6.4 - 8.3 g/dL 7.1 6.5 6.4  Total Bilirubin 0.20 - 1.20 mg/dL 0.53 0.35 0.43  Alkaline Phos 40 - 150 U/L 119 92 93  AST 5 - 34 U/L '15 18 18  ' ALT 0 - 55 U/L '13 20 18    ' Lab Results  Component Value Date   WBC 4.5 01/05/2017   HGB 13.8 01/05/2017   HCT 40.8 01/05/2017  MCV 90.3 01/05/2017   PLT 224 01/05/2017   NEUTROABS 3.2 01/05/2017      Assessment Plan:  Breast cancer of lower-outer quadrant of right female breast Right lumpectomy 11/27/2014: Invasive ductal carcinoma grade 3, 3.2 cm, with high-grade DCIS, LVI present,, 1/1 intramammary lymph node positive, 1 SLN micro-met, stage IIB, T2 N1a ER 100%, PR 12%, HER-2 negative, Ki-67 51% Patient is enrolled on PREVENT clinical trial Lipitor versus placebo S/P Adj chemo AC x 4 foll by Abraxane weekly x 12 (started 12/28/14 completed 05/14/15)   Current treatment: Anastrozole 1 mg daily 10 years Anastrozole toxicities:  Denies  any hot flashes or myalgias BCI: Benefit to extended adjuvant therapy   PREVENT Trial: No toxicities from the study drug.she completed the trial.   Breast cancer surveillance: 1. Breast exam: 03/18/2021: Benign 2. Mammograms at [facility] March 2022: Benign   She moved to [facility] recently.  She would like to follow as annually through her MyChart virtual visit. Return to clinic in 1 year for follow-up    I discussed the assessment and treatment plan with the patient. The patient was provided an opportunity to ask questions and all were answered. The patient agreed with the plan and demonstrated an understanding of the instructions. The patient was advised to call back or seek an in-person evaluation if the symptoms worsen or if the condition fails to improve as anticipated.   Total time spent: 20 minutes including face-to-face MyChart video visit time and time spent for planning, charting and coordination of care  Rulon Eisenmenger, MD 03/18/2021  I, Thana Ates am acting as scribe for Nicholas Lose, MD.  I have reviewed the above documentation for accuracy and completeness, and I agree with the above.

## 2021-03-18 ENCOUNTER — Inpatient Hospital Stay: Payer: Medicare Other | Attending: Hematology and Oncology | Admitting: Hematology and Oncology

## 2021-03-18 DIAGNOSIS — C50511 Malignant neoplasm of lower-outer quadrant of right female breast: Secondary | ICD-10-CM

## 2021-03-18 DIAGNOSIS — C50911 Malignant neoplasm of unspecified site of right female breast: Secondary | ICD-10-CM | POA: Insufficient documentation

## 2021-03-18 DIAGNOSIS — Z170 Estrogen receptor positive status [ER+]: Secondary | ICD-10-CM

## 2021-03-20 ENCOUNTER — Telehealth: Payer: Self-pay | Admitting: Hematology and Oncology

## 2021-03-20 NOTE — Telephone Encounter (Signed)
Scheduled per 8/23 los. Called and spoke with pt confirmed 8/23 appt

## 2021-04-24 DIAGNOSIS — K219 Gastro-esophageal reflux disease without esophagitis: Secondary | ICD-10-CM | POA: Diagnosis not present

## 2021-04-24 DIAGNOSIS — E559 Vitamin D deficiency, unspecified: Secondary | ICD-10-CM | POA: Diagnosis not present

## 2021-04-24 DIAGNOSIS — I519 Heart disease, unspecified: Secondary | ICD-10-CM | POA: Diagnosis not present

## 2021-04-24 DIAGNOSIS — Z79899 Other long term (current) drug therapy: Secondary | ICD-10-CM | POA: Diagnosis not present

## 2021-04-24 DIAGNOSIS — M1990 Unspecified osteoarthritis, unspecified site: Secondary | ICD-10-CM | POA: Diagnosis not present

## 2021-04-24 DIAGNOSIS — F330 Major depressive disorder, recurrent, mild: Secondary | ICD-10-CM | POA: Diagnosis not present

## 2021-04-24 DIAGNOSIS — C50919 Malignant neoplasm of unspecified site of unspecified female breast: Secondary | ICD-10-CM | POA: Diagnosis not present

## 2021-04-24 DIAGNOSIS — E7800 Pure hypercholesterolemia, unspecified: Secondary | ICD-10-CM | POA: Diagnosis not present

## 2021-04-24 DIAGNOSIS — Z1211 Encounter for screening for malignant neoplasm of colon: Secondary | ICD-10-CM | POA: Diagnosis not present

## 2021-04-24 DIAGNOSIS — G250 Essential tremor: Secondary | ICD-10-CM | POA: Diagnosis not present

## 2021-04-24 DIAGNOSIS — G479 Sleep disorder, unspecified: Secondary | ICD-10-CM | POA: Diagnosis not present

## 2021-04-24 DIAGNOSIS — Z23 Encounter for immunization: Secondary | ICD-10-CM | POA: Diagnosis not present

## 2021-05-12 ENCOUNTER — Encounter: Payer: Self-pay | Admitting: Hematology and Oncology

## 2021-07-26 DIAGNOSIS — C50919 Malignant neoplasm of unspecified site of unspecified female breast: Secondary | ICD-10-CM | POA: Diagnosis not present

## 2021-07-26 DIAGNOSIS — E7800 Pure hypercholesterolemia, unspecified: Secondary | ICD-10-CM | POA: Diagnosis not present

## 2021-07-26 DIAGNOSIS — M160 Bilateral primary osteoarthritis of hip: Secondary | ICD-10-CM | POA: Diagnosis not present

## 2021-07-26 DIAGNOSIS — K219 Gastro-esophageal reflux disease without esophagitis: Secondary | ICD-10-CM | POA: Diagnosis not present

## 2021-07-26 DIAGNOSIS — M170 Bilateral primary osteoarthritis of knee: Secondary | ICD-10-CM | POA: Diagnosis not present

## 2021-07-26 DIAGNOSIS — F330 Major depressive disorder, recurrent, mild: Secondary | ICD-10-CM | POA: Diagnosis not present

## 2021-07-26 DIAGNOSIS — F325 Major depressive disorder, single episode, in full remission: Secondary | ICD-10-CM | POA: Diagnosis not present

## 2021-07-26 DIAGNOSIS — M1990 Unspecified osteoarthritis, unspecified site: Secondary | ICD-10-CM | POA: Diagnosis not present

## 2021-12-07 ENCOUNTER — Other Ambulatory Visit: Payer: Self-pay | Admitting: Hematology and Oncology

## 2021-12-07 DIAGNOSIS — C50511 Malignant neoplasm of lower-outer quadrant of right female breast: Secondary | ICD-10-CM

## 2022-02-25 ENCOUNTER — Telehealth: Payer: Self-pay | Admitting: Hematology and Oncology

## 2022-02-25 NOTE — Telephone Encounter (Signed)
Rescheduled appointment per provider PAL. Patient is aware of the changes made to her upcoming appointment. 

## 2022-03-20 ENCOUNTER — Inpatient Hospital Stay: Payer: PPO | Admitting: Hematology and Oncology

## 2022-04-02 NOTE — Progress Notes (Signed)
HEMATOLOGY-ONCOLOGY [place] VISIT PROGRESS NOTE  I connected with Natalie Valdez on 04/02/2022 at  2:45 PM EDT by Mychart video conference and verified that I am speaking with the correct person using two identifiers.  I discussed the limitations, risks, security and privacy concerns of performing an evaluation and management service by Webex and the availability of in person appointments.  I also discussed with the patient that there may be a patient responsible charge related to this service. The patient expressed understanding and agreed to proceed.  Patient's Location: Home Physician Location: Clinic  CHIEF COMPLIANT: Follow-up of right breast cancer on anastrozole     INTERVAL HISTORY: Natalie Valdez is a 71 y.o. female with above-mentioned history of right breast cancer treated with lumpectomy, adjuvant chemotherapy, and radiation who is currently on anastrozole therapy. She presents to the clinic today Natalie Valdez visit.  Oncology History  Breast cancer of lower-outer quadrant of right female breast ([place])  11/06/2008 Surgery   Left breast lumpectomy: High-grade DCIS 2.8 cm, 18 lymph nodes negative, ER 98%, PR 81%, stage 0, patient refused antiestrogen therapy   10/17/2014 Breast US   Right breast: 2.5 cm irregular solid mass at 11:00, posterior depth   10/18/2014 Initial Biopsy   Right breast invasive ductal carcinoma with DCIS grade 3, lymphovascular invasion, ER 100%, PR 12%, HER-2 negative ratio 1.4, Ki-67 51%   10/18/2014 Clinical Stage   Stage IIA: T2 N0 M0   10/24/2014 Breast MRI   Right breast irregular enhancing mass 2.9 x 2.6 x 2.4 cm, left breast evidence of prior lumpectomy and left axillary dissection   11/27/2014 Surgery   Right lumpectomy: Invasive ductal carcinoma grade 3, 3.2 cm, with high-grade DCIS, LVI present,, 1/1 intramammary lymph node positive, 1 SLN micro-met, (2/3 total)   11/27/2014 Pathologic Stage   Stage IIB: T2 N1a   12/28/2014 - 05/10/2015 Chemotherapy    Adjuvant dose dense Adriamycin and Cytoxan X 4 followed by Abraxane weekly 12   06/04/2015 - 07/12/2015 Radiation Therapy   Adj XRT: Right breast and regional lymph nodes 50.4 Gy over 28 sessions (no boost)      08/01/2015 -  Anti-estrogen oral therapy   Anastrozole 1 mg daily 5-10 years   10/29/2015 Survivorship   Survivorship visit completed     REVIEW OF SYSTEMS:   Constitutional: Denies fevers, chills or abnormal weight loss Eyes: Denies blurriness of vision Ears, nose, mouth, throat, and face: Denies mucositis or sore throat Respiratory: Denies cough, dyspnea or wheezes Cardiovascular: Denies palpitation, chest discomfort Gastrointestinal:  Denies nausea, heartburn or change in bowel habits Skin: Denies abnormal skin rashes Lymphatics: Denies new lymphadenopathy or easy bruising Neurological:Denies numbness, tingling or new weaknesses Behavioral/Psych: Mood is stable, no new changes  Extremities: No lower extremity edema Breast: denies any pain or lumps or nodules in either breasts All other systems were reviewed with the patient and are negative.  Observations/Objective:  There were no vitals filed for this visit. There is no height or weight on file to calculate BMI.  I have reviewed the data as listed    Latest Ref Rng & Units 01/05/2017    9:48 AM 09/10/2015   10:36 AM 07/09/2015    9:16 AM  CMP  Glucose 70 - 140 mg/dl 124  136  125   BUN 7.0 - 26.0 mg/dL 13.6  14.3  14.8   Creatinine 0.6 - 1.1 mg/dL 0.8  0.8  0.7   Sodium 136 - 145 mEq/L 140  141  138  Potassium 3.5 - 5.1 mEq/L 4.0  3.7  4.2   CO2 22 - 29 mEq/L _0 Calcium 8.4 - 10.4 mg/dL 9.6  9.2  8.9   Total Protein 6.4 - 8.3 g/dL 7.1  6.5  6.4   Total Bilirubin 0.20 - 1.20 mg/dL 0.53  0.35  0.43   Alkaline Phos 40 - 150 U/L 119  92  93   AST 5 - 34 U/L _1 ALT 0 - 55 U/L _2 Lab Results  Component Value Date   WBC 4.5 01/05/2017   HGB 13.8 01/05/2017   HCT 40.8  01/05/2017   MCV 90.3 01/05/2017   PLT 224 01/05/2017   NEUTROABS 3.2 01/05/2017      Assessment Plan:  No problem-specific Assessment & Plan notes found for this encounter.    I discussed the assessment and treatment plan with the patient. The patient was provided an opportunity to ask questions and all were answered. The patient agreed with the plan and demonstrated an understanding of the instructions. The patient was advised to call back or seek an in-person evaluation if the symptoms worsen or if the condition fails to improve as anticipated.   I provided *** minutes of face-to-face Web Ex time during this encounter.    Rulon Eisenmenger, MD 04/02/2022 I Gardiner Coins am scribing for Dr. Lindi Adie  ***

## 2022-04-06 ENCOUNTER — Inpatient Hospital Stay: Payer: PPO | Attending: Hematology and Oncology | Admitting: Hematology and Oncology

## 2022-04-06 DIAGNOSIS — Z170 Estrogen receptor positive status [ER+]: Secondary | ICD-10-CM

## 2022-04-06 DIAGNOSIS — C50511 Malignant neoplasm of lower-outer quadrant of right female breast: Secondary | ICD-10-CM

## 2022-04-06 NOTE — Assessment & Plan Note (Addendum)
Right lumpectomy 11/27/2014: Invasive ductal carcinoma grade 3, 3.2 cm, with high-grade DCIS, LVI present,, 1/1 intramammary lymph node positive, 1 SLN micro-met, stage IIB, T2 N1a ER 100%, PR 12%, HER-2 negative, Ki-67 51% Patient is enrolled on PREVENT clinical trial Lipitor versus placebo S/P Adj chemo AC x 4 foll by Abraxane weekly x 12 (started 12/28/14 completed 05/14/15)  Current treatment: Anastrozole 1 mg daily 10 years Anastrozole toxicities:  Denies any hot flashes or myalgias BCI: Benefit to extended adjuvant therapy  PREVENT Trial: No toxicities from the study drug.she completed the trial.  Breast cancer surveillance: Mammograms at [facility] March 2022: Benign shes going to get a mammogram in [place]  She moved to [place] and will be getting mammograms at [place]. Return to clinic in 1 year for follow-up with a telephone visit

## 2022-09-21 ENCOUNTER — Other Ambulatory Visit: Payer: Self-pay | Admitting: Hematology and Oncology

## 2022-09-21 DIAGNOSIS — C50511 Malignant neoplasm of lower-outer quadrant of right female breast: Secondary | ICD-10-CM

## 2022-09-21 NOTE — Telephone Encounter (Signed)
Needs MD f/u visit by 03/2023

## 2022-09-23 ENCOUNTER — Telehealth: Payer: Self-pay | Admitting: Hematology and Oncology

## 2022-09-23 NOTE — Telephone Encounter (Signed)
Spoke with patient confirming appointment

## 2023-04-05 ENCOUNTER — Inpatient Hospital Stay: Payer: PPO | Attending: Hematology and Oncology | Admitting: Hematology and Oncology

## 2023-04-05 DIAGNOSIS — Z170 Estrogen receptor positive status [ER+]: Secondary | ICD-10-CM

## 2023-04-05 DIAGNOSIS — C50511 Malignant neoplasm of lower-outer quadrant of right female breast: Secondary | ICD-10-CM | POA: Diagnosis not present

## 2023-04-05 MED ORDER — ANASTROZOLE 1 MG PO TABS: ORAL_TABLET | ORAL | 3 refills | Status: DC

## 2023-04-05 NOTE — Progress Notes (Signed)
HEMATOLOGY-ONCOLOGY TELEPHONE VISIT PROGRESS NOTE  I connected with our patient on 04/05/23 at  9:15 AM EDT by telephone and verified that I am speaking with the correct person using two identifiers.  I discussed the limitations, risks, security and privacy concerns of performing an evaluation and management service by telephone and the availability of in person appointments.  I also discussed with the patient that there may be a patient responsible charge related to this service. The patient expressed understanding and agreed to proceed.   History of Present Illness:   Oncology History  Breast cancer of lower-outer quadrant of right female breast ([facility])  11/06/2008 Surgery   Left breast lumpectomy: High-grade DCIS 2.8 cm, 18 lymph nodes negative, ER 98%, PR 81%, stage 0, patient refused antiestrogen therapy   10/17/2014 Breast US   Right breast: 2.5 cm irregular solid mass at 11:00, posterior depth   10/18/2014 Initial Biopsy   Right breast invasive ductal carcinoma with DCIS grade 3, lymphovascular invasion, ER 100%, PR 12%, HER-2 negative ratio 1.4, Ki-67 51%   10/18/2014 Clinical Stage   Stage IIA: T2 N0 M0   10/24/2014 Breast MRI   Right breast irregular enhancing mass 2.9 x 2.6 x 2.4 cm, left breast evidence of prior lumpectomy and left axillary dissection   11/27/2014 Surgery   Right lumpectomy: Invasive ductal carcinoma grade 3, 3.2 cm, with high-grade DCIS, LVI present,, 1/1 intramammary lymph node positive, 1 SLN micro-met, (2/3 total)   11/27/2014 Pathologic Stage   Stage IIB: T2 N1a   12/28/2014 - 05/10/2015 Chemotherapy   Adjuvant dose dense Adriamycin and Cytoxan X 4 followed by Abraxane weekly 12   06/04/2015 - 07/12/2015 Radiation Therapy   Adj XRT: Right breast and regional lymph nodes 50.4 Gy over 28 sessions (no boost)      08/01/2015 -  Anti-estrogen oral therapy   Anastrozole 1 mg daily 5-10 years   10/29/2015 Survivorship   Survivorship visit completed     REVIEW  OF SYSTEMS:   Constitutional: Denies fevers, chills or abnormal weight loss All other systems were reviewed with the patient and are negative. Observations/Objective:     Assessment Plan:  Breast cancer of lower-outer quadrant of right female breast Right lumpectomy 11/27/2014: Invasive ductal carcinoma grade 3, 3.2 cm, with high-grade DCIS, LVI present,, 1/1 intramammary lymph node positive, 1 SLN micro-met, stage IIB, T2 N1a ER 100%, PR 12%, HER-2 negative, Ki-67 51% Patient is enrolled on PREVENT clinical trial Lipitor versus placebo S/P Adj chemo AC x 4 foll by Abraxane weekly x 12 (started 12/28/14 completed 05/14/15)   Current treatment: Anastrozole 1 mg daily 10 years started 08/11/2015 Anastrozole toxicities:  Denies any hot flashes or myalgias BCI: Benefit to extended adjuvant therapy   PREVENT Trial: No toxicities from the study drug.she completed the trial.   Breast cancer surveillance: Mammograms [facility] 03/2023: Benign shes going to get a mammogram in [place]   Knee replacement Jan 2024  She moved to [place] and will be getting mammograms at [place]. Return to clinic in 1 year for follow-up with a telephone visit   I discussed the assessment and treatment plan with the patient. The patient was provided an opportunity to ask questions and all were answered. The patient agreed with the plan and demonstrated an understanding of the instructions. The patient was advised to call back or seek an in-person evaluation if the symptoms worsen or if the condition fails to improve as anticipated.   I provided 12 minutes of non-face-to-face  time during this encounter.  This includes time for charting and coordination of care   Tamsen Meek, MD  I Janan Ridge am acting as a scribe for Dr.Dennisha Mouser  I have reviewed the above documentation for accuracy and completeness, and I agree with the above.

## 2023-04-05 NOTE — Assessment & Plan Note (Signed)
Right lumpectomy 11/27/2014: Invasive ductal carcinoma grade 3, 3.2 cm, with high-grade DCIS, LVI present,, 1/1 intramammary lymph node positive, 1 SLN micro-met, stage IIB, T2 N1a ER 100%, PR 12%, HER-2 negative, Ki-67 51% Patient is enrolled on PREVENT clinical trial Lipitor versus placebo S/P Adj chemo AC x 4 foll by Abraxane weekly x 12 (started 12/28/14 completed 05/14/15)   Current treatment: Anastrozole 1 mg daily 10 years started 08/11/2015 Anastrozole toxicities:  Denies any hot flashes or myalgias BCI: Benefit to extended adjuvant therapy   PREVENT Trial: No toxicities from the study drug.she completed the trial.   Breast cancer surveillance: Mammograms at [facility] March 2022: Benign shes going to get a mammogram in [place]   She moved to [place] and will be getting mammograms at [place]. Return to clinic in 1 year for follow-up with a telephone visit

## 2023-07-01 ENCOUNTER — Encounter: Payer: Self-pay | Admitting: Hematology and Oncology

## 2024-03-28 ENCOUNTER — Encounter: Payer: Self-pay | Admitting: Hematology and Oncology

## 2024-04-04 ENCOUNTER — Inpatient Hospital Stay: Payer: PPO | Attending: Hematology and Oncology | Admitting: Hematology and Oncology

## 2024-04-04 DIAGNOSIS — Z170 Estrogen receptor positive status [ER+]: Secondary | ICD-10-CM | POA: Diagnosis not present

## 2024-04-04 DIAGNOSIS — C50511 Malignant neoplasm of lower-outer quadrant of right female breast: Secondary | ICD-10-CM | POA: Diagnosis not present

## 2024-04-04 DIAGNOSIS — Z79811 Long term (current) use of aromatase inhibitors: Secondary | ICD-10-CM | POA: Diagnosis not present

## 2024-04-04 MED ORDER — ANASTROZOLE 1 MG PO TABS: ORAL_TABLET | ORAL | 3 refills | Status: AC

## 2024-04-04 NOTE — Progress Notes (Signed)
 HEMATOLOGY-ONCOLOGY TELEPHONE VISIT PROGRESS NOTE  I connected with our patient on 04/04/24 at  9:15 AM EDT by telephone and verified that I am speaking with the correct person using two identifiers.  I discussed the limitations, risks, security and privacy concerns of performing an evaluation and management service by telephone and the availability of in person appointments.  I also discussed with the patient that there may be a patient responsible charge related to this service. The patient expressed understanding and agreed to proceed.   History of Present Illness: Surveillance of breast cancer  History of Present Illness Natalie Valdez is a 73 year old female with breast cancer who presents for routine follow-up.  She is on anastrozole  and tolerates it well. She is compliant with mammogram screenings, with the next one due this month. Earlier this year, she underwent a bone density test and takes calcium and vitamin D supplements to support bone health.    Oncology History  Breast cancer of lower-outer quadrant of right female breast ([facility])  11/06/2008 Surgery   Left breast lumpectomy: High-grade DCIS 2.8 cm, 18 lymph nodes negative, ER 98%, PR 81%, stage 0, patient refused antiestrogen therapy   10/17/2014 Breast US    Right breast: 2.5 cm irregular solid mass at 11:00, posterior depth   10/18/2014 Initial Biopsy   Right breast invasive ductal carcinoma with DCIS grade 3, lymphovascular invasion, ER 100%, PR 12%, HER-2 negative ratio 1.4, Ki-67 51%   10/18/2014 Clinical Stage   Stage IIA: T2 N0 M0   10/24/2014 Breast MRI   Right breast irregular enhancing mass 2.9 x 2.6 x 2.4 cm, left breast evidence of prior lumpectomy and left axillary dissection   11/27/2014 Surgery   Right lumpectomy: Invasive ductal carcinoma grade 3, 3.2 cm, with high-grade DCIS, LVI present,, 1/1 intramammary lymph node positive, 1 SLN micro-met, (2/3 total)   11/27/2014 Pathologic Stage   Stage IIB: T2 N1a    12/28/2014 - 05/10/2015 Chemotherapy   Adjuvant dose dense Adriamycin  and Cytoxan  X 4 followed by Abraxane  weekly 12   06/04/2015 - 07/12/2015 Radiation Therapy   Adj XRT: Right breast and regional lymph nodes 50.4 Gy over 28 sessions (no boost)      08/01/2015 -  Anti-estrogen oral therapy   Anastrozole  1 mg daily 5-10 years   10/29/2015 Survivorship   Survivorship visit completed     REVIEW OF SYSTEMS:   Constitutional: Denies fevers, chills or abnormal weight loss All other systems were reviewed with the patient and are negative. Observations/Objective:     Assessment Plan:  Breast cancer of lower-outer quadrant of right female breast Right lumpectomy 11/27/2014: Invasive ductal carcinoma grade 3, 3.2 cm, with high-grade DCIS, LVI present,, 1/1 intramammary lymph node positive, 1 SLN micro-met, stage IIB, T2 N1a ER 100%, PR 12%, HER-2 negative, Ki-67 51% Patient is enrolled on PREVENT clinical trial Lipitor versus placebo S/P Adj chemo AC x 4 foll by Abraxane  weekly x 12 (started 12/28/14 completed 05/14/15)   Current treatment: Anastrozole  1 mg daily 10 years started 08/11/2015 Anastrozole  toxicities:  Denies any hot flashes or myalgias BCI: Benefit to extended adjuvant therapy   PREVENT Trial: No toxicities from the study. she completed the trial.   Breast cancer surveillance: Mammograms [facility] 03/2023: Benign shes going to get another mammogram in [place] Patient will forward her bone density test and the mammogram report once it is done.  We will call her if I notice any major changes in her bone density.  She believes  that the bone density was normal.  Knee replacement Jan 2024   She enjoys living in [place] the [place] use [place]. Telephone appointment in 1 year    I discussed the assessment and treatment plan with the patient. The patient was provided an opportunity to ask questions and all were answered. The patient agreed with the plan and  demonstrated an understanding of the instructions. The patient was advised to call back or seek an in-person evaluation if the symptoms worsen or if the condition fails to improve as anticipated.   I provided 20 minutes of non-face-to-face time during this encounter.  This includes time for charting and coordination of care   Naomi MARLA Chad, MD

## 2024-04-04 NOTE — Assessment & Plan Note (Signed)
 Right lumpectomy 11/27/2014: Invasive ductal carcinoma grade 3, 3.2 cm, with high-grade DCIS, LVI present,, 1/1 intramammary lymph node positive, 1 SLN micro-met, stage IIB, T2 N1a ER 100%, PR 12%, HER-2 negative, Ki-67 51% Patient is enrolled on PREVENT clinical trial Lipitor versus placebo S/P Adj chemo AC x 4 foll by Abraxane  weekly x 12 (started 12/28/14 completed 05/14/15)   Current treatment: Anastrozole  1 mg daily 10 years started 08/11/2015 Anastrozole  toxicities:  Denies any hot flashes or myalgias BCI: Benefit to extended adjuvant therapy   PREVENT Trial: No toxicities from the study. she completed the trial.   Breast cancer surveillance: Mammograms [facility] 03/2023: Benign shes going to get a mammogram in [place]   Knee replacement Jan 2024   She moved to [place] and will be getting mammograms at [place]. Return to clinic in 1 year for follow-up

## 2024-08-04 ENCOUNTER — Encounter: Payer: Self-pay | Admitting: Hematology and Oncology
